# Patient Record
Sex: Male | Born: 1960 | Race: White | Hispanic: No | Marital: Married | Smoking: Former smoker
Health system: Southern US, Community
[De-identification: ages and names within clinical notes are randomized; demographics above are authoritative.]

## PROBLEM LIST (undated history)

## (undated) DIAGNOSIS — F419 Anxiety disorder, unspecified: Secondary | ICD-10-CM

## (undated) DIAGNOSIS — I10 Essential (primary) hypertension: Secondary | ICD-10-CM

## (undated) DIAGNOSIS — I499 Cardiac arrhythmia, unspecified: Secondary | ICD-10-CM

## (undated) DIAGNOSIS — I493 Ventricular premature depolarization: Secondary | ICD-10-CM

## (undated) DIAGNOSIS — M1990 Unspecified osteoarthritis, unspecified site: Secondary | ICD-10-CM

## (undated) DIAGNOSIS — R011 Cardiac murmur, unspecified: Secondary | ICD-10-CM

## (undated) HISTORY — DX: Cardiac murmur, unspecified: R01.1

## (undated) HISTORY — PX: HERNIA REPAIR: SHX51

## (undated) HISTORY — DX: Essential (primary) hypertension: I10

## (undated) HISTORY — DX: Anxiety disorder, unspecified: F41.9

---

## 2008-07-26 ENCOUNTER — Ambulatory Visit: Payer: Self-pay | Admitting: Internal Medicine

## 2008-07-26 DIAGNOSIS — K279 Peptic ulcer, site unspecified, unspecified as acute or chronic, without hemorrhage or perforation: Secondary | ICD-10-CM | POA: Insufficient documentation

## 2008-07-26 DIAGNOSIS — K219 Gastro-esophageal reflux disease without esophagitis: Secondary | ICD-10-CM

## 2008-07-26 DIAGNOSIS — R9431 Abnormal electrocardiogram [ECG] [EKG]: Secondary | ICD-10-CM | POA: Insufficient documentation

## 2008-07-26 DIAGNOSIS — F528 Other sexual dysfunction not due to a substance or known physiological condition: Secondary | ICD-10-CM

## 2008-07-26 DIAGNOSIS — J309 Allergic rhinitis, unspecified: Secondary | ICD-10-CM | POA: Insufficient documentation

## 2008-07-26 DIAGNOSIS — I10 Essential (primary) hypertension: Secondary | ICD-10-CM | POA: Insufficient documentation

## 2008-07-26 LAB — CONVERTED CEMR LAB
AST: 28 units/L (ref 0–37)
Alkaline Phosphatase: 62 units/L (ref 39–117)
BUN: 10 mg/dL (ref 6–23)
Basophils Relative: 0.9 % (ref 0.0–3.0)
Bilirubin Urine: NEGATIVE
Bilirubin, Direct: 0.1 mg/dL (ref 0.0–0.3)
CO2: 29 meq/L (ref 19–32)
Calcium: 9.2 mg/dL (ref 8.4–10.5)
Cholesterol: 216 mg/dL — ABNORMAL HIGH (ref 0–200)
Creatinine, Ser: 0.8 mg/dL (ref 0.4–1.5)
Eosinophils Absolute: 0.2 10*3/uL (ref 0.0–0.7)
HCT: 40 % (ref 39.0–52.0)
HDL: 40.6 mg/dL (ref >39.00)
Hemoglobin: 14 g/dL (ref 13.0–17.0)
Lymphs Abs: 1.5 10*3/uL (ref 0.7–4.0)
MCHC: 35.1 g/dL (ref 30.0–36.0)
Monocytes Relative: 6.4 % (ref 3.0–12.0)
Neutro Abs: 2.3 10*3/uL (ref 1.4–7.7)
Neutrophils Relative %: 54.5 % (ref 43.0–77.0)
RBC: 4.09 M/uL — ABNORMAL LOW (ref 4.22–5.81)
Sodium: 143 meq/L (ref 135–145)
Specific Gravity, Urine: <=1.005 (ref 1.000–1.030)
TSH: 1.71 microintl units/mL (ref 0.35–5.50)
Total Protein: 6.4 g/dL (ref 6.0–8.3)
Urine Glucose: NEGATIVE mg/dL
Urobilinogen, UA: 0.2 (ref 0.0–1.0)
pH: 6 (ref 5.0–8.0)

## 2008-07-27 ENCOUNTER — Encounter: Payer: Self-pay | Admitting: Internal Medicine

## 2008-07-27 ENCOUNTER — Encounter (INDEPENDENT_AMBULATORY_CARE_PROVIDER_SITE_OTHER): Payer: Self-pay | Admitting: *Deleted

## 2008-08-08 ENCOUNTER — Telehealth: Payer: Self-pay | Admitting: Internal Medicine

## 2008-08-23 ENCOUNTER — Ambulatory Visit: Payer: Self-pay | Admitting: Internal Medicine

## 2008-08-23 DIAGNOSIS — E785 Hyperlipidemia, unspecified: Secondary | ICD-10-CM | POA: Insufficient documentation

## 2008-08-23 LAB — CONVERTED CEMR LAB
Cholesterol: 200 mg/dL (ref 0–200)
Direct LDL: 101 mg/dL
HDL: 35.2 mg/dL — ABNORMAL LOW (ref >39.00)

## 2008-09-28 ENCOUNTER — Ambulatory Visit: Payer: Self-pay | Admitting: Internal Medicine

## 2008-09-28 DIAGNOSIS — K649 Unspecified hemorrhoids: Secondary | ICD-10-CM

## 2008-11-09 ENCOUNTER — Ambulatory Visit: Payer: Self-pay | Admitting: Internal Medicine

## 2009-03-26 ENCOUNTER — Ambulatory Visit: Payer: Self-pay | Admitting: Internal Medicine

## 2009-09-26 ENCOUNTER — Ambulatory Visit: Payer: Self-pay | Admitting: Internal Medicine

## 2009-10-12 ENCOUNTER — Ambulatory Visit: Payer: Self-pay | Admitting: Internal Medicine

## 2009-10-12 DIAGNOSIS — K4090 Unilateral inguinal hernia, without obstruction or gangrene, not specified as recurrent: Secondary | ICD-10-CM | POA: Insufficient documentation

## 2009-10-16 ENCOUNTER — Emergency Department (HOSPITAL_COMMUNITY): Admission: EM | Admit: 2009-10-16 | Discharge: 2009-10-17 | Payer: Self-pay | Admitting: Emergency Medicine

## 2009-10-17 ENCOUNTER — Telehealth: Payer: Self-pay | Admitting: Internal Medicine

## 2009-10-23 ENCOUNTER — Encounter: Payer: Self-pay | Admitting: Internal Medicine

## 2009-11-27 ENCOUNTER — Encounter: Payer: Self-pay | Admitting: Internal Medicine

## 2010-01-08 ENCOUNTER — Ambulatory Visit: Payer: Self-pay | Admitting: Internal Medicine

## 2010-04-23 ENCOUNTER — Encounter: Payer: Self-pay | Admitting: Internal Medicine

## 2010-04-23 ENCOUNTER — Ambulatory Visit: Payer: Self-pay | Admitting: Internal Medicine

## 2010-04-23 LAB — CONVERTED CEMR LAB
Basophils Relative: 0.6 % (ref 0.0–3.0)
Bilirubin Urine: NEGATIVE
Blood, UA: NEGATIVE
Calcium: 8.9 mg/dL (ref 8.4–10.5)
Cholesterol: 181 mg/dL (ref 0–200)
Direct LDL: 98.6 mg/dL
Eosinophils Absolute: 0.2 10*3/uL (ref 0.0–0.7)
Eosinophils Relative: 2.8 % (ref 0.0–5.0)
Glucose, Bld: 87 mg/dL (ref 70–99)
HCT: 40.2 % (ref 39.0–52.0)
Hemoglobin: 14.1 g/dL (ref 13.0–17.0)
Ketones, ur: NEGATIVE mg/dL
Lymphocytes Relative: 23.5 % (ref 12.0–46.0)
Lymphs Abs: 1.3 10*3/uL (ref 0.7–4.0)
MCHC: 35.2 g/dL (ref 30.0–36.0)
Monocytes Relative: 7.5 % (ref 3.0–12.0)
PSA: 0.16 ng/mL (ref 0.10–4.00)
RBC: 4.08 M/uL — ABNORMAL LOW (ref 4.22–5.81)
RDW: 13.9 % (ref 11.5–14.6)
Specific Gravity, Urine: >=1.03 (ref 1.000–1.030)
Total Protein: 5.8 g/dL — ABNORMAL LOW (ref 6.0–8.3)
Urobilinogen, UA: 0.2 (ref 0.0–1.0)
VLDL: 51.2 mg/dL — ABNORMAL HIGH (ref 0.0–40.0)
WBC: 5.6 10*3/uL (ref 4.5–10.5)

## 2010-04-26 ENCOUNTER — Telehealth: Payer: Self-pay | Admitting: Internal Medicine

## 2010-05-28 NOTE — Assessment & Plan Note (Signed)
Summary: ?hernia,bulge about "privates' per pt/#/cd   Vital Signs:  Patient profile:   50 year old male Height:      71 inches Weight:      193 pounds BMI:     27.02 O2 Sat:      98 % on Room air Temp:     98.2 degrees F oral Pulse rate:   81 / minute Pulse rhythm:   regular Resp:     16 per minute BP sitting:   126 / 80  (left arm) Cuff size:   large  Vitals Entered By: Rock Nephew CMA (October 12, 2009 3:24 PM)  O2 Flow:  Room air  Primary Care Provider:  Sanda Linger,  MD   History of Present Illness: He reports a 2 day hx. of a bulge in his left groin after lifting a Kayak.  Preventive Screening-Counseling & Management  Alcohol-Tobacco     Alcohol drinks/day: 1     Alcohol type: spirits     >5/day in last 3 mos: no     Alcohol Counseling: not indicated; use of alcohol is not excessive or problematic     Feels need to cut down: no     Feels annoyed by complaints: no     Feels guilty re: drinking: no     Needs 'eye opener' in am: no     Smoking Status: quit     Smoking Cessation Counseling: no     Smoke Cessation Stage: quit     Year Quit: 1998  Hep-HIV-STD-Contraception     Hepatitis Risk: no risk noted     HIV Risk: no risk noted     STD Risk: no risk noted  Medications Prior to Update: 1)  Azor 5-20 Mg Tabs (Amlodipine-Olmesartan) .... Once Daily 2)  Allegra 180 Mg Tabs (Fexofenadine Hcl) .... As Needed 3)  Multivitamins   Tabs (Multiple Vitamin) .... One Tablet By Mouth Once Daily 4)  Garlic 300 Mg Tabs (Garlic) .... Two Tablets By Mouth Once Daily 5)  Fish Oil 1000 Mg Caps (Omega-3 Fatty Acids) .... 3 Tablets By Mouth Once Daily 6)  Aspirin 81 Mg  Tabs (Aspirin) .... Two Tablets By Mouth Once Daily 7)  Benadryl 25 Mg Caps (Diphenhydramine Hcl) .... One Tablet At Bedtime 8)  Flaxseed Oil  Oil (Flaxseed (Linseed)) .... Two Tablet By Mouth Once Daily At Bedtime  Current Medications (verified): 1)  Azor 5-20 Mg Tabs (Amlodipine-Olmesartan) .... Once  Daily 2)  Allegra 180 Mg Tabs (Fexofenadine Hcl) .... As Needed 3)  Multivitamins   Tabs (Multiple Vitamin) .... One Tablet By Mouth Once Daily 4)  Garlic 300 Mg Tabs (Garlic) .... Two Tablets By Mouth Once Daily 5)  Fish Oil 1000 Mg Caps (Omega-3 Fatty Acids) .... 3 Tablets By Mouth Once Daily 6)  Aspirin 81 Mg  Tabs (Aspirin) .... Two Tablets By Mouth Once Daily 7)  Benadryl 25 Mg Caps (Diphenhydramine Hcl) .... One Tablet At Bedtime 8)  Flaxseed Oil  Oil (Flaxseed (Linseed)) .... Two Tablet By Mouth Once Daily At Bedtime  Allergies (verified): No Known Drug Allergies  Past History:  Past Medical History: Last updated: 07/26/2008 Hypertension Peptic ulcer disease Allergic rhinitis PVC's ED Undescended testes causing infertility  Past Surgical History: Last updated: 07/26/2008 Denies surgical history  Family History: Last updated: 09/28/2008 Family History of Arthritis Family History of CAD Male 1st degree relative <50 Family History of Colon CA 1st degree relative father 60's Family History Hypertension Family History of Prostate  CA 1st degree relative 70's Family History of Stroke F 1st degree relative <60 Family History of Heart Disease: Father  Social History: Last updated: 09/28/2008 Occupation: Investment banker, corporate Married Alcohol use-yes Drug use-no Regular exercise-yes Daily Caffeine Use: 1-5 cups of coffee daily  Risk Factors: Alcohol Use: 1 (10/12/2009) >5 drinks/d w/in last 3 months: no (10/12/2009) Exercise: yes (07/26/2008)  Risk Factors: Smoking Status: quit (10/12/2009)  Family History: Reviewed history from 09/28/2008 and no changes required. Family History of Arthritis Family History of CAD Male 1st degree relative <50 Family History of Colon CA 1st degree relative father 80's Family History Hypertension Family History of Prostate CA 1st degree relative 56's Family History of Stroke F 1st degree relative <60 Family History of Heart Disease:  Father  Social History: Reviewed history from 09/28/2008 and no changes required. Occupation: Investment banker, corporate Married Alcohol use-yes Drug use-no Regular exercise-yes Daily Caffeine Use: 1-5 cups of coffee daily  Review of Systems  The patient denies anorexia, fever, chest pain, abdominal pain, hematuria, incontinence, and enlarged lymph nodes.   GU:  Denies decreased libido, discharge, dysuria, erectile dysfunction, hematuria, nocturia, urinary frequency, and urinary hesitancy.  Physical Exam  General:  Well developed, well nourished, no acute distress. Mouth:  No deformity or lesions, dentition normal. Neck:  supple, full ROM, no masses, and no thyromegaly.   Lungs:  Clear throughout to auscultation. Heart:  Regular rate and rhythm; no murmurs, rubs,  or bruits. Abdomen:  soft, non-tender, normal bowel sounds, no distention, no masses, no guarding, no rigidity, and no rebound tenderness.  he has bilateral hernia scars and direct inguinal hernias, L>R. they are easily reduced. Genitalia:  uncircumcised, no scrotal masses, no testicular masses or atrophy, no cutaneous lesions, no urethral discharge, R hydrocele, and L hydrocele.   Msk:  normal ROM, no joint tenderness, and no joint swelling.   Extremities:  no edema Skin:  turgor normal, color normal, no rashes, no suspicious lesions, no ecchymoses, no petechiae, no purpura, and no edema.   Axillary Nodes:  no R axillary adenopathy and no L axillary adenopathy.   Psych:  Cognition and judgment appear intact. Alert and cooperative with normal attention span and concentration. No apparent delusions, illusions, hallucinations   Impression & Recommendations:  Problem # 1:  INGUINAL HERNIA (ICD-550.90) Assessment New  Orders: Surgical Referral (Surgery)  Complete Medication List: 1)  Azor 5-20 Mg Tabs (Amlodipine-olmesartan) .... Once daily 2)  Allegra 180 Mg Tabs (Fexofenadine hcl) .... As needed 3)  Multivitamins Tabs  (Multiple vitamin) .... One tablet by mouth once daily 4)  Garlic 300 Mg Tabs (Garlic) .... Two tablets by mouth once daily 5)  Fish Oil 1000 Mg Caps (Omega-3 fatty acids) .... 3 tablets by mouth once daily 6)  Aspirin 81 Mg Tabs (Aspirin) .... Two tablets by mouth once daily 7)  Benadryl 25 Mg Caps (Diphenhydramine hcl) .... One tablet at bedtime 8)  Flaxseed Oil Oil (Flaxseed (linseed)) .... Two tablet by mouth once daily at bedtime  Patient Instructions: 1)  Please schedule a follow-up appointment in 2 weeks.

## 2010-05-28 NOTE — Assessment & Plan Note (Signed)
Summary: FU ON BP/NWS   #   Vital Signs:  Patient profile:   50 year old male Height:      71 inches Weight:      194 pounds O2 Sat:      98 % on Room air Temp:     99.0 degrees F oral Pulse rate:   75 / minute Pulse rhythm:   regular Resp:     16 per minute BP sitting:   118 / 82  (left arm) Cuff size:   large  Vitals Entered By: Rock Nephew CMA (September 26, 2009 11:01 AM)  O2 Flow:  Room air  Primary Care Provider:  Sanda Linger,  MD   History of Present Illness:  Hypertension Follow-Up      This is a 50 year old man who presents for Hypertension follow-up.  The patient denies lightheadedness, urinary frequency, headaches, edema, impotence, rash, and fatigue.  The patient denies the following associated symptoms: chest pain, chest pressure, exercise intolerance, dyspnea, palpitations, syncope, leg edema, and pedal edema.  Compliance with medications (by patient report) has been near 100%.  The patient reports that dietary compliance has been excellent.  The patient reports exercising 3-4X per week.  Adjunctive measures currently used by the patient include salt restriction and relaxation.    Preventive Screening-Counseling & Management  Alcohol-Tobacco     Alcohol drinks/day: 1     Alcohol type: spirits     >5/day in last 3 mos: no     Alcohol Counseling: not indicated; use of alcohol is not excessive or problematic     Feels need to cut down: no     Feels annoyed by complaints: no     Feels guilty re: drinking: no     Needs 'eye opener' in am: no     Smoking Status: quit     Smoking Cessation Counseling: no     Smoke Cessation Stage: quit     Year Quit: 1998  Hep-HIV-STD-Contraception     Hepatitis Risk: no risk noted     HIV Risk: no risk noted     STD Risk: no risk noted      Sexual History:  currently monogamous.        Drug Use:  never and no.        Blood Transfusions:  no.    Medications Prior to Update: 1)  Azor 5-20 Mg Tabs (Amlodipine-Olmesartan)  .... Once Daily 2)  Allegra 180 Mg Tabs (Fexofenadine Hcl) .... As Needed 3)  Multivitamins   Tabs (Multiple Vitamin) .... One Tablet By Mouth Once Daily 4)  Garlic 300 Mg Tabs (Garlic) .... Two Tablets By Mouth Once Daily 5)  Fish Oil 1000 Mg Caps (Omega-3 Fatty Acids) .... 3 Tablets By Mouth Once Daily 6)  Aspirin 81 Mg  Tabs (Aspirin) .... Two Tablets By Mouth Once Daily 7)  Benadryl 25 Mg Caps (Diphenhydramine Hcl) .... One Tablet At Bedtime 8)  Flaxseed Oil  Oil (Flaxseed (Linseed)) .... Two Tablet By Mouth Once Daily At Bedtime  Current Medications (verified): 1)  Azor 5-20 Mg Tabs (Amlodipine-Olmesartan) .... Once Daily 2)  Allegra 180 Mg Tabs (Fexofenadine Hcl) .... As Needed 3)  Multivitamins   Tabs (Multiple Vitamin) .... One Tablet By Mouth Once Daily 4)  Garlic 300 Mg Tabs (Garlic) .... Two Tablets By Mouth Once Daily 5)  Fish Oil 1000 Mg Caps (Omega-3 Fatty Acids) .... 3 Tablets By Mouth Once Daily 6)  Aspirin 81 Mg  Tabs (Aspirin) .... Two Tablets By Mouth Once Daily 7)  Benadryl 25 Mg Caps (Diphenhydramine Hcl) .... One Tablet At Bedtime 8)  Flaxseed Oil  Oil (Flaxseed (Linseed)) .... Two Tablet By Mouth Once Daily At Bedtime  Allergies (verified): No Known Drug Allergies  Past History:  Past Medical History: Last updated: 07/26/2008 Hypertension Peptic ulcer disease Allergic rhinitis PVC's ED Undescended testes causing infertility  Past Surgical History: Last updated: 07/26/2008 Denies surgical history  Family History: Last updated: 09/28/2008 Family History of Arthritis Family History of CAD Male 1st degree relative <50 Family History of Colon CA 1st degree relative father 14's Family History Hypertension Family History of Prostate CA 1st degree relative 55's Family History of Stroke F 1st degree relative <60 Family History of Heart Disease: Father  Social History: Last updated: 09/28/2008 Occupation: Investment banker, corporate Married Alcohol use-yes Drug  use-no Regular exercise-yes Daily Caffeine Use: 1-5 cups of coffee daily  Risk Factors: Alcohol Use: 1 (09/26/2009) >5 drinks/d w/in last 3 months: no (09/26/2009) Exercise: yes (07/26/2008)  Risk Factors: Smoking Status: quit (09/26/2009)  Family History: Reviewed history from 09/28/2008 and no changes required. Family History of Arthritis Family History of CAD Male 1st degree relative <50 Family History of Colon CA 1st degree relative father 62's Family History Hypertension Family History of Prostate CA 1st degree relative 27's Family History of Stroke F 1st degree relative <60 Family History of Heart Disease: Father  Social History: Reviewed history from 09/28/2008 and no changes required. Occupation: Investment banker, corporate Married Alcohol use-yes Drug use-no Regular exercise-yes Daily Caffeine Use: 1-5 cups of coffee daily  Review of Systems  The patient denies anorexia, fever, chest pain, syncope, peripheral edema, prolonged cough, abdominal pain, suspicious skin lesions, and enlarged lymph nodes.    Physical Exam  General:  Well developed, well nourished, no acute distress. Mouth:  No deformity or lesions, dentition normal. Neck:  supple, full ROM, no masses, and no thyromegaly.   Lungs:  Clear throughout to auscultation. Heart:  Regular rate and rhythm; no murmurs, rubs,  or bruits. Abdomen:  Soft, nontender and nondistended. No masses, hepatosplenomegaly or hernias noted.  Msk:  normal ROM, no joint tenderness, and no joint swelling.   Pulses:  R and L carotid,radial,femoral,dorsalis pedis and posterior tibial pulses are full and equal bilaterally Extremities:  no edema Neurologic:  Alert and  oriented x4;   Skin:  turgor normal, color normal, no rashes, no suspicious lesions, no ecchymoses, no petechiae, no purpura, and no edema.   Cervical Nodes:  No lymphadenopathy noted Axillary Nodes:  No palpable lymphadenopathy Psych:  Cognition and judgment appear intact.  Alert and cooperative with normal attention span and concentration. No apparent delusions, illusions, hallucinations   Impression & Recommendations:  Problem # 1:  HYPERTENSION (ICD-401.9) Assessment Improved  His updated medication list for this problem includes:    Azor 5-20 Mg Tabs (Amlodipine-olmesartan) ..... Once daily  BP today: 118/82 Prior BP: 120/84 (03/26/2009)  Prior 10 Yr Risk Heart Disease: Not enough information (07/26/2008)  Labs Reviewed: K+: 3.9 (07/26/2008) Creat: : 0.8 (07/26/2008)   Chol: 200 (08/23/2008)   HDL: 35.20 (08/23/2008)   TG: 295.0 (08/23/2008)  Complete Medication List: 1)  Azor 5-20 Mg Tabs (Amlodipine-olmesartan) .... Once daily 2)  Allegra 180 Mg Tabs (Fexofenadine hcl) .... As needed 3)  Multivitamins Tabs (Multiple vitamin) .... One tablet by mouth once daily 4)  Garlic 300 Mg Tabs (Garlic) .... Two tablets by mouth once daily 5)  Fish Oil 1000 Mg  Caps (Omega-3 fatty acids) .... 3 tablets by mouth once daily 6)  Aspirin 81 Mg Tabs (Aspirin) .... Two tablets by mouth once daily 7)  Benadryl 25 Mg Caps (Diphenhydramine hcl) .... One tablet at bedtime 8)  Flaxseed Oil Oil (Flaxseed (linseed)) .... Two tablet by mouth once daily at bedtime  Patient Instructions: 1)  Please schedule a follow-up appointment in 4 months. 2)  It is important that you exercise regularly at least 20 minutes 5 times a week. If you develop chest pain, have severe difficulty breathing, or feel very tired , stop exercising immediately and seek medical attention. 3)  You need to lose weight. Consider a lower calorie diet and regular exercise.  4)  Check your Blood Pressure regularly. If it is above 140/90: you should make an appointment. Prescriptions: AZOR 5-20 MG TABS (AMLODIPINE-OLMESARTAN) once daily  #140 x 0   Entered and Authorized by:   Etta Grandchild MD   Signed by:   Etta Grandchild MD on 09/26/2009   Method used:   Samples Given   RxID:   [id number]

## 2010-05-28 NOTE — Assessment & Plan Note (Signed)
Summary: FU---W/BP CHECK---STC   Vital Signs:  Patient profile:   50 year old male Height:      71 inches Weight:      196 pounds O2 Sat:      98 % on Room air Temp:     98.3 degrees F oral Pulse rate:   70 / minute Pulse rhythm:   regular Resp:     16 per minute BP sitting:   132 / 76  O2 Flow:  Room air  Primary Care Provider:  Sanda Linger,  MD   History of Present Illness:  Hypertension Follow-Up      This is a 50 year old man who presents for Hypertension follow-up.  The patient reports impotence, but denies lightheadedness, urinary frequency, headaches, edema, rash, and fatigue.  The patient denies the following associated symptoms: chest pain, chest pressure, exercise intolerance, dyspnea, palpitations, syncope, leg edema, and pedal edema.  Compliance with medications (by patient report) has been near 100%.  The patient reports that dietary compliance has been fair.  The patient reports exercising occasionally.  Adjunctive measures currently used by the patient include salt restriction and relaxation.    Preventive Screening-Counseling & Management  Alcohol-Tobacco     Alcohol drinks/day: 1     Alcohol type: spirits     >5/day in last 3 mos: no     Alcohol Counseling: not indicated; use of alcohol is not excessive or problematic     Feels need to cut down: no     Feels annoyed by complaints: no     Feels guilty re: drinking: no     Needs 'eye opener' in am: no     Smoking Status: quit     Smoking Cessation Counseling: no     Smoke Cessation Stage: quit     Year Quit: 1998     Tobacco Counseling: to remain off tobacco products  Hep-HIV-STD-Contraception     Hepatitis Risk: no risk noted     HIV Risk: no risk noted     STD Risk: no risk noted      Sexual History:  currently monogamous.        Drug Use:  never and no.        Blood Transfusions:  no.    Medications Prior to Update: 1)  Azor 5-20 Mg Tabs (Amlodipine-Olmesartan) .... Once Daily 2)  Allegra 180  Mg Tabs (Fexofenadine Hcl) .... As Needed 3)  Multivitamins   Tabs (Multiple Vitamin) .... One Tablet By Mouth Once Daily 4)  Garlic 300 Mg Tabs (Garlic) .... Two Tablets By Mouth Once Daily 5)  Fish Oil 1000 Mg Caps (Omega-3 Fatty Acids) .... 3 Tablets By Mouth Once Daily 6)  Aspirin 81 Mg  Tabs (Aspirin) .... Two Tablets By Mouth Once Daily 7)  Benadryl 25 Mg Caps (Diphenhydramine Hcl) .... One Tablet At Bedtime 8)  Flaxseed Oil  Oil (Flaxseed (Linseed)) .... Two Tablet By Mouth Once Daily At Bedtime  Current Medications (verified): 1)  Azor 5-20 Mg Tabs (Amlodipine-Olmesartan) .... Once Daily 2)  Allegra 180 Mg Tabs (Fexofenadine Hcl) .... As Needed 3)  Multivitamins   Tabs (Multiple Vitamin) .... One Tablet By Mouth Once Daily 4)  Garlic 300 Mg Tabs (Garlic) .... Two Tablets By Mouth Once Daily 5)  Fish Oil 1000 Mg Caps (Omega-3 Fatty Acids) .... 3 Tablets By Mouth Once Daily 6)  Aspirin 81 Mg  Tabs (Aspirin) .... Two Tablets By Mouth Once Daily 7)  Benadryl 25 Mg  Caps (Diphenhydramine Hcl) .... One Tablet At Bedtime 8)  Flaxseed Oil  Oil (Flaxseed (Linseed)) .... Two Tablet By Mouth Once Daily At Bedtime 9)  Viagra 100 Mg Tabs (Sildenafil Citrate) .... Take As Directed  Allergies (verified): No Known Drug Allergies  Past History:  Past Medical History: Last updated: 07/26/2008 Hypertension Peptic ulcer disease Allergic rhinitis PVC's ED Undescended testes causing infertility  Past Surgical History: Last updated: 07/26/2008 Denies surgical history  Family History: Last updated: 09/28/2008 Family History of Arthritis Family History of CAD Male 1st degree relative <50 Family History of Colon CA 1st degree relative father 68's Family History Hypertension Family History of Prostate CA 1st degree relative 59's Family History of Stroke F 1st degree relative <60 Family History of Heart Disease: Father  Social History: Last updated: 09/28/2008 Occupation: Occupational hygienist Married Alcohol use-yes Drug use-no Regular exercise-yes Daily Caffeine Use: 1-5 cups of coffee daily  Risk Factors: Alcohol Use: 1 (01/08/2010) >5 drinks/d w/in last 3 months: no (01/08/2010) Exercise: yes (07/26/2008)  Risk Factors: Smoking Status: quit (01/08/2010)  Family History: Reviewed history from 09/28/2008 and no changes required. Family History of Arthritis Family History of CAD Male 1st degree relative <50 Family History of Colon CA 1st degree relative father 37's Family History Hypertension Family History of Prostate CA 1st degree relative 72's Family History of Stroke F 1st degree relative <60 Family History of Heart Disease: Father  Social History: Reviewed history from 09/28/2008 and no changes required. Occupation: Investment banker, corporate Married Alcohol use-yes Drug use-no Regular exercise-yes Daily Caffeine Use: 1-5 cups of coffee daily  Review of Systems  The patient denies anorexia, fever, weight loss, weight gain, chest pain, syncope, dyspnea on exertion, peripheral edema, prolonged cough, headaches, hemoptysis, abdominal pain, hematuria, enlarged lymph nodes, and testicular masses.   GU:  Complains of erectile dysfunction; denies decreased libido, discharge, dysuria, hematuria, incontinence, nocturia, urinary frequency, and urinary hesitancy.  Physical Exam  General:  Well developed, well nourished, no acute distress. Head:  Normocephalic and atraumatic without obvious abnormalities. No apparent alopecia or balding. Mouth:  No deformity or lesions, dentition normal. Neck:  supple, full ROM, no masses, and no thyromegaly.   Lungs:  Clear throughout to auscultation. Heart:  Regular rate and rhythm; no murmurs, rubs,  or bruits. Abdomen:  soft, non-tender, normal bowel sounds, no distention, no masses, no guarding, no rigidity, and no rebound tenderness.  he has bilateral hernia scars and direct inguinal hernias, L>R. they are easily reduced. Msk:   normal ROM, no joint tenderness, and no joint swelling.   Pulses:  R and L carotid,radial,femoral,dorsalis pedis and posterior tibial pulses are full and equal bilaterally Extremities:  no edema Neurologic:  Alert and  oriented x4;   Skin:  turgor normal, color normal, no rashes, no suspicious lesions, no ecchymoses, no petechiae, no purpura, and no edema.   Cervical Nodes:  no anterior cervical adenopathy and no posterior cervical adenopathy.   Psych:  Cognition and judgment appear intact. Alert and cooperative with normal attention span and concentration. No apparent delusions, illusions, hallucinations   Impression & Recommendations:  Problem # 1:  HYPERTENSION (ICD-401.9) Assessment Unchanged  His updated medication list for this problem includes:    Azor 5-20 Mg Tabs (Amlodipine-olmesartan) ..... Once daily  BP today: 132/76 Prior BP: 126/80 (10/12/2009)  Prior 10 Yr Risk Heart Disease: Not enough information (07/26/2008)  Labs Reviewed: K+: 3.9 (07/26/2008) Creat: : 0.8 (07/26/2008)   Chol: 200 (08/23/2008)   HDL: 35.20 (08/23/2008)  TG: 295.0 (08/23/2008)  Problem # 2:  ERECTILE DYSFUNCTION (ICD-302.72) Assessment: Unchanged  His updated medication list for this problem includes:    Viagra 100 Mg Tabs (Sildenafil citrate) .Marland Kitchen... Take as directed  Discussed proper use of medications, as well as side effects.   Complete Medication List: 1)  Azor 5-20 Mg Tabs (Amlodipine-olmesartan) .... Once daily 2)  Allegra 180 Mg Tabs (Fexofenadine hcl) .... As needed 3)  Multivitamins Tabs (Multiple vitamin) .... One tablet by mouth once daily 4)  Garlic 300 Mg Tabs (Garlic) .... Two tablets by mouth once daily 5)  Fish Oil 1000 Mg Caps (Omega-3 fatty acids) .... 3 tablets by mouth once daily 6)  Aspirin 81 Mg Tabs (Aspirin) .... Two tablets by mouth once daily 7)  Benadryl 25 Mg Caps (Diphenhydramine hcl) .... One tablet at bedtime 8)  Flaxseed Oil Oil (Flaxseed (linseed)) ....  Two tablet by mouth once daily at bedtime 9)  Viagra 100 Mg Tabs (Sildenafil citrate) .... Take as directed  Patient Instructions: 1)  Please schedule a follow-up appointment in 4 months. 2)  It is important that you exercise regularly at least 20 minutes 5 times a week. If you develop chest pain, have severe difficulty breathing, or feel very tired , stop exercising immediately and seek medical attention. 3)  You need to lose weight. Consider a lower calorie diet and regular exercise.  4)  Check your Blood Pressure regularly. If it is above 140/90: you should make an appointment. Prescriptions: VIAGRA 100 MG TABS (SILDENAFIL CITRATE) take as directed  #25 x 11   Entered and Authorized by:   Etta Grandchild MD   Signed by:   Etta Grandchild MD on 01/08/2010   Method used:   Print then Give to Patient   RxID:   [id number]

## 2010-05-28 NOTE — Letter (Signed)
Summary: [facility]  [facility]   Imported By: Lennie Odor 10/26/2009 14:25:53  _____________________________________________________________________  External Attachment:    Type:   Image     Comment:   External Document

## 2010-05-28 NOTE — Letter (Signed)
Summary: [facility]  [facility]   Imported By: Lester Belle Chasse 11/30/2009 12:47:26  _____________________________________________________________________  External Attachment:    Type:   Image     Comment:   External Document

## 2010-05-28 NOTE — Progress Notes (Signed)
Summary: Call Report  Phone Note Other Incoming   Caller: [facility] Call Report Summary of Call: [facility] Triage Call Report Triage Record Num: [id number] Operator: Sula Rumple Patient Name: [facility] Call Date & Time: 10/16/2009 11:16:19PM Patient Phone: PCP: [place] [facility] Patient Gender: Male PCP Fax : [phone] Patient DOB: 05/09/1960 Practice Name: [facility] Reason for Call: Pt calling on 10/16/09/states he was dx with a hernia last week/has had previous hernia repair as a child/pt has a lump above the lt testicle/lt testicle has been swollen/pt has a fever of 102/pt's testicle is red/more swollen/pain is "not bad"/advised to go to ER Protocol(s) Used: Scrotum / Testicles Symptoms Recommended Outcome per Protocol: See Provider within 4 hours Reason for Outcome: New signs of local infection Care Advice:  ~ 06/ Initial call taken by: Margaret Pyle, CMA,  October 17, 2009 8:22 AM

## 2010-05-30 NOTE — Letter (Signed)
Summary: Lipid Letter  [name]  [address] [place], [place] [address]   Phone: [phone]  Fax: [phone]    04/23/2010  Jared [name] [address] [place], [place]  [address]  Dear Jared [name]:  We have carefully reviewed your last lipid profile from  and the results are noted below with a summary of recommendations for lipid management.    Cholesterol:       181     Goal: <200   HDL "good" Cholesterol:   57.84     Goal: >40   LDL "bad" Cholesterol:   98     Goal: <130   Triglycerides:       256.0     Goal: <150 this needs some work    OTHER LABS LOOK GREAT!!!!!!!!    TLC Diet (Therapeutic Lifestyle Change): Saturated Fats & Transfatty acids should be kept < 7% of total calories ***Reduce Saturated Fats Polyunstaurated Fat can be up to 10% of total calories Monounsaturated Fat Fat can be up to 20% of total calories Total Fat should be no greater than 25-35% of total calories Carbohydrates should be 50-60% of total calories Protein should be approximately 15% of total calories Fiber should be at least 20-30 grams a day ***Increased fiber may help lower LDL Total Cholesterol should be < 200mg /day Consider adding plant stanol/sterols to diet (example: Benacol spread) ***A higher intake of unsaturated fat may reduce Triglycerides and Increase HDL    Adjunctive Measures (may lower LIPIDS and reduce risk of Heart Attack) include: Aerobic Exercise (20-30 minutes 3-4 times a week) Limit Alcohol Consumption Weight Reduction Aspirin 75-81 mg a day by mouth (if not allergic or contraindicated) Dietary Fiber 20-30 grams a day by mouth     Current Medications: 1)    Azor 5-20 Mg Tabs (Amlodipine-olmesartan) .... Once daily 2)    Allegra 180 Mg Tabs (Fexofenadine hcl) .... As needed 3)    Multivitamins   Tabs (Multiple vitamin) .... One tablet by mouth once daily 4)    Garlic 300 Mg Tabs (Garlic) .... Two tablets by mouth once daily 5)    Fish Oil 1000 Mg  Caps (Omega-3 fatty acids) .... 3 tablets by mouth once daily 6)    Aspirin 81 Mg  Tabs (Aspirin) .... Two tablets by mouth once daily 7)    Benadryl 25 Mg Caps (Diphenhydramine hcl) .... One tablet at bedtime 8)    Flaxseed Oil  Oil (Flaxseed (linseed)) .... Two tablet by mouth once daily at bedtime 9)    Viagra 100 Mg Tabs (Sildenafil citrate) .... Take as directed  If you have any questions, please call. We appreciate being able to work with you.   Sincerely,    [facility] Etta Grandchild MD

## 2010-05-30 NOTE — Assessment & Plan Note (Signed)
Summary: CPX /  TO COME FASTING/ NWS   Vital Signs:  Patient profile:   50 year old male Height:      71 inches Weight:      196 pounds BMI:     27.44 O2 Sat:      96 % on Room air Temp:     98.5 degrees F oral Pulse rate:   88 / minute Pulse rhythm:   regular Resp:     16 per minute BP sitting:   100 / 62  (left arm) Cuff size:   large  Vitals Entered By: Bill Salinas CMA (April 23, 2010 9:04 AM)  Nutrition Counseling: Patient's BMI is greater than 25 and therefore counseled on weight management options.  O2 Flow:  Room air CC: cpx, Preventive Care Is Patient Diabetic? No Pain Assessment Patient in pain? no       Vision Screening:      Vision Comments: eye exam oct 2011 with slight change in prescription   Primary Care Provider:  Sanda Linger,  MD  CC:  cpx and Preventive Care.  History of Present Illness:  Follow-Up Visit      This is a 50 year old man who presents for Follow-up visit.  The patient denies chest pain, palpitations, dizziness, syncope, edema, SOB, DOE, PND, and orthopnea.  Since the last visit the patient notes no new problems or concerns.  The patient reports taking meds as prescribed, monitoring BP, and dietary compliance.  When questioned about possible medication side effects, the patient notes none.    Preventive Screening-Counseling & Management  Alcohol-Tobacco     Alcohol drinks/day: 1     Alcohol type: spirits     >5/day in last 3 mos: no     Alcohol Counseling: not indicated; use of alcohol is not excessive or problematic     Feels need to cut down: no     Feels annoyed by complaints: no     Feels guilty re: drinking: no     Needs 'eye opener' in am: no     Smoking Status: quit     Smoking Cessation Counseling: no     Smoke Cessation Stage: quit     Year Quit: 1998     Tobacco Counseling: to remain off tobacco products  Hep-HIV-STD-Contraception     Hepatitis Risk: no risk noted     HIV Risk: no risk noted     STD Risk: no  risk noted      Sexual History:  currently monogamous.        Drug Use:  never and no.        Blood Transfusions:  no.    Clinical Review Panels:  Prevention   Last Colonoscopy:  Location:  [facility].  (11/09/2008)   Last PSA:  0.15 (07/26/2008)  Lipid Management   Cholesterol:  200 (08/23/2008)   HDL (good cholesterol):  35.20 (08/23/2008)  Diabetes Management   Creatinine:  0.8 (07/26/2008)  CBC   WBC:  4.3 (07/26/2008)   RBC:  4.09 (07/26/2008)   Hgb:  14.0 (07/26/2008)   Hct:  40.0 (07/26/2008)   Platelets:  207.0 (07/26/2008)   MCV  97.7 (07/26/2008)   MCHC  35.1 (07/26/2008)   RDW  12.0 (07/26/2008)   PMN:  54.5 (07/26/2008)   Lymphs:  34.4 (07/26/2008)   Monos:  6.4 (07/26/2008)   Eosinophils:  3.8 (07/26/2008)   Basophil:  0.9 (07/26/2008)  Complete Metabolic Panel   Glucose:  96 (  07/26/2008)   Sodium:  143 (07/26/2008)   Potassium:  3.9 (07/26/2008)   Chloride:  109 (07/26/2008)   CO2:  29 (07/26/2008)   BUN:  10 (07/26/2008)   Creatinine:  0.8 (07/26/2008)   Albumin:  3.9 (07/26/2008)   Total Protein:  6.4 (07/26/2008)   Calcium:  9.2 (07/26/2008)   Total Bili:  0.9 (07/26/2008)   Alk Phos:  62 (07/26/2008)   SGPT (ALT):  39 (07/26/2008)   SGOT (AST):  28 (07/26/2008)   Medications Prior to Update: 1)  Azor 5-20 Mg Tabs (Amlodipine-Olmesartan) .... Once Daily 2)  Allegra 180 Mg Tabs (Fexofenadine Hcl) .... As Needed 3)  Multivitamins   Tabs (Multiple Vitamin) .... One Tablet By Mouth Once Daily 4)  Garlic 300 Mg Tabs (Garlic) .... Two Tablets By Mouth Once Daily 5)  Fish Oil 1000 Mg Caps (Omega-3 Fatty Acids) .... 3 Tablets By Mouth Once Daily 6)  Aspirin 81 Mg  Tabs (Aspirin) .... Two Tablets By Mouth Once Daily 7)  Benadryl 25 Mg Caps (Diphenhydramine Hcl) .... One Tablet At Bedtime 8)  Flaxseed Oil  Oil (Flaxseed (Linseed)) .... Two Tablet By Mouth Once Daily At Bedtime 9)  Viagra 100 Mg Tabs (Sildenafil Citrate) .... Take As  Directed  Current Medications (verified): 1)  Azor 5-20 Mg Tabs (Amlodipine-Olmesartan) .... Once Daily 2)  Allegra 180 Mg Tabs (Fexofenadine Hcl) .... As Needed 3)  Multivitamins   Tabs (Multiple Vitamin) .... One Tablet By Mouth Once Daily 4)  Garlic 300 Mg Tabs (Garlic) .... Two Tablets By Mouth Once Daily 5)  Fish Oil 1000 Mg Caps (Omega-3 Fatty Acids) .... 3 Tablets By Mouth Once Daily 6)  Aspirin 81 Mg  Tabs (Aspirin) .... Two Tablets By Mouth Once Daily 7)  Benadryl 25 Mg Caps (Diphenhydramine Hcl) .... One Tablet At Bedtime 8)  Flaxseed Oil  Oil (Flaxseed (Linseed)) .... Two Tablet By Mouth Once Daily At Bedtime 9)  Viagra 100 Mg Tabs (Sildenafil Citrate) .... Take As Directed  Allergies (verified): No Known Drug Allergies  Past History:  Past Medical History: Last updated: 07/26/2008 Hypertension Peptic ulcer disease Allergic rhinitis PVC's ED Undescended testes causing infertility  Past Surgical History: Last updated: 07/26/2008 Denies surgical history  Family History: Last updated: 09/28/2008 Family History of Arthritis Family History of CAD Male 1st degree relative <50 Family History of Colon CA 1st degree relative father 73's Family History Hypertension Family History of Prostate CA 1st degree relative 85's Family History of Stroke F 1st degree relative <60 Family History of Heart Disease: Father  Social History: Last updated: 09/28/2008 Occupation: Investment banker, corporate Married Alcohol use-yes Drug use-no Regular exercise-yes Daily Caffeine Use: 1-5 cups of coffee daily  Risk Factors: Alcohol Use: 1 (04/23/2010) >5 drinks/d w/in last 3 months: no (04/23/2010) Exercise: yes (07/26/2008)  Risk Factors: Smoking Status: quit (04/23/2010)  Family History: Reviewed history from 09/28/2008 and no changes required. Family History of Arthritis Family History of CAD Male 1st degree relative <50 Family History of Colon CA 1st degree relative father  31's Family History Hypertension Family History of Prostate CA 1st degree relative 27's Family History of Stroke F 1st degree relative <60 Family History of Heart Disease: Father  Social History: Reviewed history from 09/28/2008 and no changes required. Occupation: Investment banker, corporate Married Alcohol use-yes Drug use-no Regular exercise-yes Daily Caffeine Use: 1-5 cups of coffee daily  Review of Systems  The patient denies anorexia, fever, weight loss, weight gain, chest pain, syncope, dyspnea on exertion, peripheral edema,  prolonged cough, headaches, hemoptysis, abdominal pain, melena, hematochezia, severe indigestion/heartburn, hematuria, incontinence, suspicious skin lesions, transient blindness, difficulty walking, depression, enlarged lymph nodes, angioedema, and testicular masses.   GU:  Complains of erectile dysfunction; denies decreased libido, discharge, dysuria, hematuria, incontinence, nocturia, urinary frequency, and urinary hesitancy.  Physical Exam  General:  Well developed, well nourished, no acute distress. Head:  Normocephalic and atraumatic without obvious abnormalities. No apparent alopecia or balding. Eyes:  vision grossly intact, pupils equal, pupils round, pupils reactive to light, pupils react to accomodation, and no injection.   Ears:  R ear normal and L ear normal.   Nose:  External nasal examination shows no deformity or inflammation. Nasal mucosa are pink and moist without lesions or exudates. Mouth:  Oral mucosa and oropharynx without lesions or exudates.  Teeth in good repair. Neck:  supple, full ROM, no masses, no thyromegaly, no thyroid nodules or tenderness, no JVD, normal carotid upstroke, no carotid bruits, no cervical lymphadenopathy, and no neck tenderness.   Breasts:  No masses or gynecomastia noted Lungs:  Normal respiratory effort, chest expands symmetrically. Lungs are clear to auscultation, no crackles or wheezes. Heart:  Normal rate and regular  rhythm. S1 and S2 normal without gallop, murmur, click, rub or other extra sounds. Abdomen:  soft, non-tender, normal bowel sounds, no distention, no masses, no guarding, no rigidity, no rebound tenderness, no abdominal hernia, no inguinal hernia, no hepatomegaly, and no splenomegaly.   Rectal:  no external abnormalities, normal sphincter tone, no masses, no tenderness, no fissures, no fistulae, no perianal rash, and external hemorrhoid(s).   Genitalia:  circumcised, no hydrocele, no varicocele, no scrotal masses, no cutaneous lesions, no urethral discharge, R testes atrophic, and L testes atrophic.   Prostate:  no gland enlargement, no nodules, no asymmetry, and no induration.   Msk:  No deformity or scoliosis noted of thoracic or lumbar spine.   Pulses:  R and L carotid,radial,femoral,dorsalis pedis and posterior tibial pulses are full and equal bilaterally Extremities:  No clubbing, cyanosis, edema, or deformity noted with normal full range of motion of all joints.   Neurologic:  No cranial nerve deficits noted. Station and gait are normal. Plantar reflexes are down-going bilaterally. DTRs are symmetrical throughout. Sensory, motor and coordinative functions appear intact. Skin:  turgor normal, color normal, no rashes, no suspicious lesions, no ecchymoses, no petechiae, no purpura, no ulcerations, and no edema.   Cervical Nodes:  no anterior cervical adenopathy and no posterior cervical adenopathy.   Axillary Nodes:  no R axillary adenopathy and no L axillary adenopathy.   Inguinal Nodes:  no R inguinal adenopathy and no L inguinal adenopathy.   Psych:  Cognition and judgment appear intact. Alert and cooperative with normal attention span and concentration. No apparent delusions, illusions, hallucinations Additional Exam:  EKG is normal    Impression & Recommendations:  Problem # 1:  HYPERLIPIDEMIA (ICD-272.4) Assessment Unchanged  Orders: TLB-Lipid Panel (80061-LIPID) TLB-BMP (Basic  Metabolic Panel-BMET) (80048-METABOL) TLB-CBC Platelet - w/Differential (85025-CBCD) TLB-Hepatic/Liver Function Pnl (80076-HEPATIC) TLB-TSH (Thyroid Stimulating Hormone) (84443-TSH) TLB-PSA (Prostate Specific Antigen) (84153-PSA) TLB-Udip w/ Micro (81001-URINE)  Labs Reviewed: SGOT: 28 (07/26/2008)   SGPT: 39 (07/26/2008)  Prior 10 Yr Risk Heart Disease: Not enough information (07/26/2008)   HDL:35.20 (08/23/2008), 40.60 (07/26/2008)  Chol:200 (08/23/2008), 216 (07/26/2008)  Trig:295.0 (08/23/2008), 363.0 (07/26/2008)  Problem # 2:  HYPERTENSION (ICD-401.9) Assessment: Improved  His updated medication list for this problem includes:    Azor 5-20 Mg Tabs (Amlodipine-olmesartan) ..... Once daily  Orders: TLB-Lipid Panel (80061-LIPID) TLB-BMP (Basic Metabolic Panel-BMET) (80048-METABOL) TLB-CBC Platelet - w/Differential (85025-CBCD) TLB-Hepatic/Liver Function Pnl (80076-HEPATIC) TLB-TSH (Thyroid Stimulating Hormone) (84443-TSH) TLB-PSA (Prostate Specific Antigen) (84153-PSA) TLB-Udip w/ Micro (81001-URINE) EKG w/ Interpretation (93000)  Problem # 3:  NONSPECIFIC ABNORMAL ELECTROCARDIOGRAM (ICD-794.31) Assessment: Improved  Orders: TLB-Lipid Panel (80061-LIPID) TLB-BMP (Basic Metabolic Panel-BMET) (80048-METABOL) TLB-CBC Platelet - w/Differential (85025-CBCD) TLB-Hepatic/Liver Function Pnl (80076-HEPATIC) TLB-TSH (Thyroid Stimulating Hormone) (84443-TSH) TLB-PSA (Prostate Specific Antigen) (84153-PSA) TLB-Udip w/ Micro (81001-URINE) EKG w/ Interpretation (93000)  Problem # 4:  Preventive Health Care (ICD-V70.0) Assessment: New  Colonoscopy: Location:  [facility].   (11/09/2008) Chol: 200 (08/23/2008)   HDL: 35.20 (08/23/2008)   TG: 295.0 (08/23/2008) TSH: 1.71 (07/26/2008)   PSA: 0.15 (07/26/2008) Next Colonoscopy due:: 10/2018 (11/09/2008)  Discussed using sunscreen, use of alcohol, drug use, self testicular exam, routine dental care, routine eye care,  routine physical exam, seat belts, multiple vitamins, and recommendations for immunizations.  Discussed exercise and checking cholesterol.  Discussed gun safety, safe sex, and contraception. Also recommend checking PSA.  Complete Medication List: 1)  Azor 5-20 Mg Tabs (Amlodipine-olmesartan) .... Once daily 2)  Allegra 180 Mg Tabs (Fexofenadine hcl) .... As needed 3)  Multivitamins Tabs (Multiple vitamin) .... One tablet by mouth once daily 4)  Garlic 300 Mg Tabs (Garlic) .... Two tablets by mouth once daily 5)  Fish Oil 1000 Mg Caps (Omega-3 fatty acids) .... 3 tablets by mouth once daily 6)  Aspirin 81 Mg Tabs (Aspirin) .... Two tablets by mouth once daily 7)  Benadryl 25 Mg Caps (Diphenhydramine hcl) .... One tablet at bedtime 8)  Flaxseed Oil Oil (Flaxseed (linseed)) .... Two tablet by mouth once daily at bedtime 9)  Viagra 100 Mg Tabs (Sildenafil citrate) .... Take as directed  Other Orders: Venipuncture (04540)  Colorectal Screening:  Current Recommendations:    Hemoccult: NEG X 1 today  PSA Screening:    PSA: 0.15  (07/26/2008)    Reviewed PSA screening recommendations: PSA ordered  Patient Instructions: 1)  Please schedule a follow-up appointment in 4 months. 2)  It is important that you exercise regularly at least 20 minutes 5 times a week. If you develop chest pain, have severe difficulty breathing, or feel very tired , stop exercising immediately and seek medical attention. 3)  You need to lose weight. Consider a lower calorie diet and regular exercise.  4)  Check your Blood Pressure regularly. If it is above 140/90: you should make an appointment.   Orders Added: 1)  Venipuncture [36415] 2)  TLB-Lipid Panel [80061-LIPID] 3)  TLB-BMP (Basic Metabolic Panel-BMET) [80048-METABOL] 4)  TLB-CBC Platelet - w/Differential [85025-CBCD] 5)  TLB-Hepatic/Liver Function Pnl [80076-HEPATIC] 6)  TLB-TSH (Thyroid Stimulating Hormone) [84443-TSH] 7)  TLB-PSA (Prostate Specific  Antigen) [84153-PSA] 8)  TLB-Udip w/ Micro [81001-URINE] 9)  EKG w/ Interpretation [93000] 10)  Est. Patient 40-64 years [98119]

## 2010-05-30 NOTE — Progress Notes (Signed)
Summary: LIPID ?  Phone Note Call from Patient Call back at [phone] (OK VM ON CELL)   Summary of Call: Patient is requesting to know the percentage of LDL small particles to large particles.  Initial call taken by: Lamar Sprinkles, CMA,  April 26, 2010 12:49 PM  Follow-up for Phone Call        we will have to send a specimen to a specialty lab for that, I will oder and see if lab can send from the previous specimen Follow-up by: Etta Grandchild MD,  April 26, 2010 12:54 PM  Additional Follow-up for Phone Call Additional follow up Details #1::        Pt advised via VM that order placed per MD, to call back to check status of test in 3-4 days Additional Follow-up by: Margaret Pyle, CMA,  May 03, 2010 10:48 AM

## 2010-07-14 LAB — DIFFERENTIAL
Basophils Absolute: 0 10*3/uL (ref 0.0–0.1)
Lymphocytes Relative: 16 % (ref 12–46)
Lymphs Abs: 1.5 10*3/uL (ref 0.7–4.0)
Neutro Abs: 6.8 10*3/uL (ref 1.7–7.7)
Neutrophils Relative %: 75 % (ref 43–77)

## 2010-07-14 LAB — URINE CULTURE

## 2010-07-14 LAB — CBC
HCT: 40.1 % (ref 39.0–52.0)
Hemoglobin: 13.5 g/dL (ref 13.0–17.0)
MCV: 99.2 fL (ref 78.0–100.0)
RBC: 4.04 MIL/uL — ABNORMAL LOW (ref 4.22–5.81)
WBC: 9.1 10*3/uL (ref 4.0–10.5)

## 2010-07-14 LAB — BASIC METABOLIC PANEL
BUN: 11 mg/dL (ref 6–23)
CO2: 28 mEq/L (ref 19–32)
Chloride: 104 mEq/L (ref 96–112)
Creatinine, Ser: 0.93 mg/dL (ref 0.4–1.5)
Glucose, Bld: 115 mg/dL — ABNORMAL HIGH (ref 70–99)
Potassium: 3.9 mEq/L (ref 3.5–5.1)

## 2010-07-14 LAB — URINALYSIS, ROUTINE W REFLEX MICROSCOPIC
Ketones, ur: NEGATIVE mg/dL
Nitrite: NEGATIVE
pH: 6 (ref 5.0–8.0)

## 2010-07-14 LAB — URINE MICROSCOPIC-ADD ON

## 2013-12-19 ENCOUNTER — Ambulatory Visit (INDEPENDENT_AMBULATORY_CARE_PROVIDER_SITE_OTHER): Payer: 59 | Admitting: Internal Medicine

## 2013-12-19 ENCOUNTER — Encounter: Payer: Self-pay | Admitting: Internal Medicine

## 2013-12-19 VITALS — BP 130/92 | HR 97 | Temp 98.6°F | Resp 16

## 2013-12-19 DIAGNOSIS — I776 Arteritis, unspecified: Secondary | ICD-10-CM

## 2013-12-19 LAB — POCT URINALYSIS DIPSTICK
BILIRUBIN UA: NEGATIVE
Glucose, UA: NEGATIVE
KETONES UA: NEGATIVE
LEUKOCYTES UA: NEGATIVE
Nitrite, UA: NEGATIVE
PROTEIN UA: NEGATIVE
RBC UA: NEGATIVE
Spec Grav, UA: <=1.005
UROBILINOGEN UA: 0.2
pH, UA: 6

## 2013-12-19 LAB — POCT CBC
GRANULOCYTE PERCENT: 63.7 % (ref 37–80)
HCT, POC: 42.9 % — AB (ref 43.5–53.7)
Hemoglobin: 14 g/dL — AB (ref 14.1–18.1)
Lymph, poc: 1.3 (ref 0.6–3.4)
MCH: 31.7 pg — AB (ref 27–31.2)
MCHC: 32.8 g/dL (ref 31.8–35.4)
MCV: 96.6 fL (ref 80–97)
MID (CBC): 0.4 (ref 0–0.9)
MPV: 6.4 fL (ref 0–99.8)
PLATELET COUNT, POC: 205 10*3/uL (ref 142–424)
POC Granulocyte: 2.9 (ref 2–6.9)
POC LYMPH %: 28.6 % (ref 10–50)
POC MID %: 7.7 %M (ref 0–12)
RBC: 4.44 M/uL — AB (ref 4.69–6.13)
RDW, POC: 13.7 %
WBC: 4.6 10*3/uL (ref 4.6–10.2)

## 2013-12-19 LAB — POCT UA - MICROSCOPIC ONLY
Bacteria, U Microscopic: NEGATIVE
CRYSTALS, UR, HPF, POC: NEGATIVE
Casts, Ur, LPF, POC: NEGATIVE
Mucus, UA: NEGATIVE
RBC, URINE, MICROSCOPIC: NEGATIVE
Yeast, UA: NEGATIVE

## 2013-12-19 MED ORDER — FLUOCINONIDE 0.05 % EX CREA: 1.0000 "application " | TOPICAL_CREAM | Freq: Two times a day (BID) | CUTANEOUS | Status: DC

## 2013-12-19 MED ORDER — SULFAMETHOXAZOLE-TMP DS 800-160 MG PO TABS: 1.0000 | ORAL_TABLET | Freq: Two times a day (BID) | ORAL | Status: DC

## 2013-12-19 NOTE — Progress Notes (Addendum)
Subjective:    Patient ID: Jared [name], male    DOB: 02/26/1961, 53 y.o.   MRN: [id number]  This chart was scribed for Tami Lin, MD by Erling Conte, Medical Scribe. This patient was seen in Room 6 and the patient's care was started at 8:10 PM.   Chief Complaint  Patient presents with  . Leg Pain  . Leg Swelling    HPI HPI Comments: Jared [name] is a 53 y.o. male with a h/o inguinal hernia, hemorrhoids, hyperlipidemia, erectile dysfunction, HTN, GERD, and peptic ulcers who presents to the [facility] complaining of constant, moderate, gradually worsening leg pain in his right leg that began this morning. He is having associated swelling of the right lower calf, redness to area, and chills tho unsure if febrile. Pt is concerned about a possible DVT. He denies any history of DVT in the past. Pt states he was cutting the grass earlier today when he noticed the pain and redness to his left calf. He admits that the pain is exacerbated by movement and ambulation. He denies any shortness of breath, fever, or chest pain.  Patient Active Problem List   Diagnosis Date Noted  . INGUINAL HERNIA 10/12/2009  . HEMORRHOIDS, WITH BLEEDING 09/28/2008  . HYPERLIPIDEMIA 08/23/2008  . ERECTILE DYSFUNCTION 07/26/2008  . HYPERTENSION 07/26/2008  . ALLERGIC RHINITIS 07/26/2008  . GERD 07/26/2008  . PEPTIC ULCER DISEASE 07/26/2008  . NONSPECIFIC ABNORMAL ELECTROCARDIOGRAM 07/26/2008   No past medical history on file. No past surgical history on file. No Known Allergies Prior to Admission medications   Medication Sig Start Date End Date Taking? Authorizing Provider  aspirin 81 MG tablet Take 81 mg by mouth daily.   Yes Historical Provider, MD  amlod-olmesartin       Review of Systems  Constitutional: Positive for chills and diaphoresis. Negative for fever.  Respiratory: Negative for shortness of breath.   Cardiovascular: Negative for chest pain.    Musculoskeletal: Positive for arthralgias, joint swelling and myalgias.  Skin: Positive for color change (redness to R lower calf).  All other systems reviewed and are negative.      Objective:   Physical Exam  Nursing note and vitals reviewed. Constitutional: He is oriented to person, place, and time. He appears well-developed and well-nourished. No distress.  HENT:  Head: Normocephalic and atraumatic.  Right Ear: External ear normal.  Left Ear: External ear normal.  Eyes: Conjunctivae and EOM are normal. Pupils are equal, round, and reactive to light.  Neck: Normal range of motion. Neck supple.  Cardiovascular: Normal rate, regular rhythm and normal heart sounds.  Exam reveals no gallop and no friction rub.   No murmur heard. Pulmonary/Chest: Effort normal and breath sounds normal. No respiratory distress.  Musculoskeletal: Normal range of motion.  Redness and tenderness in bilateral lower legs. R > than L. Vasculitis present. Full peripheral pulses. No joint involvement.  Neurological: He is alert and oriented to person, place, and time.  Skin: Skin is warm and dry. Rash noted.  Psychiatric: He has a normal mood and affect. His behavior is normal.    Filed Vitals:   12/19/13 2004  BP: 130/92  Pulse: 97  Temp: 98.6 F (37 C)  Resp: 16  SpO2: 99%   Results for orders placed in visit on 12/19/13  COMPREHENSIVE METABOLIC PANEL      Result Value Ref Range   Sodium 138  135 - 145 mEq/L   Potassium 4.4  3.5 - 5.3 mEq/L  Chloride 104  96 - 112 mEq/L   CO2 27  19 - 32 mEq/L   Glucose, Bld 95  70 - 99 mg/dL   BUN 20  6 - 23 mg/dL   Creat 1.32  0.50 - 1.35 mg/dL   Total Bilirubin 0.6  0.2 - 1.2 mg/dL   Alkaline Phosphatase 81  39 - 117 U/L   AST 40 (*) 0 - 37 U/L   ALT 41  0 - 53 U/L   Total Protein 6.8  6.0 - 8.3 g/dL   Albumin 4.4  3.5 - 5.2 g/dL   Calcium 9.2  8.4 - 10.5 mg/dL  SEDIMENTATION RATE      Result Value Ref Range   Sed Rate    0 - 16 mm/hr  POCT CBC       Result Value Ref Range   WBC 4.6  4.6 - 10.2 K/uL   Lymph, poc 1.3  0.6 - 3.4   POC LYMPH PERCENT 28.6  10 - 50 %L   MID (cbc) 0.4  0 - 0.9   POC MID % 7.7  0 - 12 %M   POC Granulocyte 2.9  2 - 6.9   Granulocyte percent 63.7  37 - 80 %G   RBC 4.44 (*) 4.69 - 6.13 M/uL   Hemoglobin 14.0 (*) 14.1 - 18.1 g/dL   HCT, POC 42.9 (*) 43.5 - 53.7 %   MCV 96.6  80 - 97 fL   MCH, POC 31.7 (*) 27 - 31.2 pg   MCHC 32.8  31.8 - 35.4 g/dL   RDW, POC 13.7     Platelet Count, POC 205  142 - 424 K/uL   MPV 6.4  0 - 99.8 fL  POCT URINALYSIS DIPSTICK      Result Value Ref Range   Color, UA yellow     Clarity, UA clear     Glucose, UA neg     Bilirubin, UA neg     Ketones, UA neg     Spec Grav, UA <=1.005     Blood, UA neg     pH, UA 6.0     Protein, UA neg     Urobilinogen, UA 0.2     Nitrite, UA neg     Leukocytes, UA Negative    POCT UA - MICROSCOPIC ONLY      Result Value Ref Range   WBC, Ur, HPF, POC 0-1     RBC, urine, microscopic neg     Bacteria, U Microscopic neg     Mucus, UA neg     Epithelial cells, urine per micros 0-1     Crystals, Ur, HPF, POC neg     Casts, Ur, LPF, POC neg     Yeast, UA neg           Assessment & Plan:   Vasculitis -small vessel-uncomplicated Cover for cellulitis Apply lidex for hypersens  have completed the patient encounter in its entirety as documented by the scribe, with editing by me where necessary. Lynley Killilea P. Laney Pastor, M.D.  F/u 48-72 Esr pending--1 lft up

## 2013-12-20 LAB — COMPREHENSIVE METABOLIC PANEL
ALT: 41 U/L (ref 0–53)
AST: 40 U/L — AB (ref 0–37)
Albumin: 4.4 g/dL (ref 3.5–5.2)
Alkaline Phosphatase: 81 U/L (ref 39–117)
BILIRUBIN TOTAL: 0.6 mg/dL (ref 0.2–1.2)
BUN: 20 mg/dL (ref 6–23)
CHLORIDE: 104 meq/L (ref 96–112)
CO2: 27 meq/L (ref 19–32)
Calcium: 9.2 mg/dL (ref 8.4–10.5)
Creat: 1.32 mg/dL (ref 0.50–1.35)
Glucose, Bld: 95 mg/dL (ref 70–99)
Potassium: 4.4 mEq/L (ref 3.5–5.3)
Sodium: 138 mEq/L (ref 135–145)
TOTAL PROTEIN: 6.8 g/dL (ref 6.0–8.3)

## 2013-12-21 LAB — SEDIMENTATION RATE

## 2014-05-19 ENCOUNTER — Emergency Department (HOSPITAL_COMMUNITY)
Admission: EM | Admit: 2014-05-19 | Discharge: 2014-05-21 | Disposition: A | Discharge status: Home / Self Care | Payer: Federal, State, Local not specified - Other | Attending: Emergency Medicine | Admitting: Emergency Medicine

## 2014-05-19 ENCOUNTER — Encounter (HOSPITAL_COMMUNITY): Payer: Self-pay | Admitting: Oncology

## 2014-05-19 DIAGNOSIS — T1491XA Suicide attempt, initial encounter: Secondary | ICD-10-CM | POA: Diagnosis present

## 2014-05-19 DIAGNOSIS — I10 Essential (primary) hypertension: Secondary | ICD-10-CM | POA: Insufficient documentation

## 2014-05-19 DIAGNOSIS — Z87891 Personal history of nicotine dependence: Secondary | ICD-10-CM | POA: Insufficient documentation

## 2014-05-19 DIAGNOSIS — R011 Cardiac murmur, unspecified: Secondary | ICD-10-CM | POA: Insufficient documentation

## 2014-05-19 DIAGNOSIS — R45851 Suicidal ideations: Secondary | ICD-10-CM | POA: Insufficient documentation

## 2014-05-19 DIAGNOSIS — Z79899 Other long term (current) drug therapy: Secondary | ICD-10-CM | POA: Insufficient documentation

## 2014-05-19 DIAGNOSIS — F329 Major depressive disorder, single episode, unspecified: Secondary | ICD-10-CM | POA: Diagnosis present

## 2014-05-19 DIAGNOSIS — F32A Depression, unspecified: Secondary | ICD-10-CM | POA: Diagnosis present

## 2014-05-19 DIAGNOSIS — F1010 Alcohol abuse, uncomplicated: Secondary | ICD-10-CM | POA: Diagnosis present

## 2014-05-19 DIAGNOSIS — R000 Tachycardia, unspecified: Secondary | ICD-10-CM | POA: Insufficient documentation

## 2014-05-19 DIAGNOSIS — Z792 Long term (current) use of antibiotics: Secondary | ICD-10-CM | POA: Insufficient documentation

## 2014-05-19 NOTE — ED Provider Notes (Signed)
CSN: [id number]     Arrival date & time 05/19/14  2314 History   First MD Initiated Contact with Patient 05/19/14 2321     Chief Complaint  Patient presents with  . Medical Clearance    pt brought in by [employer] d/t argument w/ wife     (Consider location/radiation/quality/duration/timing/severity/associated sxs/prior Treatment) HPI Comments: 54 year old male brought to the ED by the police after stating he was going to shoot himself in his head to his wife this evening. Patient states he and his wife were having an argument over their child's snow pants, he got angry and stated that he was going to shoot himself in the head. He denies any suicidal or homicidal ideations. States he would not act on this. Admits to drinking "enough" today, including wine, alcoholic coolers and Jaeger. States he drinks on a daily basis, "red wine since it will keep me alive for over 100 years". Denies any pain at this time.  The history is provided by the patient and the police.    Past Medical History  Diagnosis Date  . Heart murmur   . Hypertension    History reviewed. No pertinent past surgical history. Family History  Problem Relation Age of Onset  . Heart disease Mother   . Hypertension Mother   . Stroke Mother   . Heart disease Father    History  Substance Use Topics  . Smoking status: Former Games developer  . Smokeless tobacco: Not on file  . Alcohol Use: 7.0 oz/week    14 Not specified per week    Review of Systems  Psychiatric/Behavioral:       + Alcohol use.  All other systems reviewed and are negative.     Allergies  Review of patient's allergies indicates no known allergies.  Home Medications   Prior to Admission medications   Medication Sig Start Date End Date Taking? Authorizing Provider  amLODipine-olmesartan (AZOR) 5-20 MG per tablet Take 1 tablet by mouth daily.   Yes Historical Provider, MD  aspirin 81 MG tablet Take 81 mg by mouth daily.   Yes Historical Provider, MD   fluocinonide cream (LIDEX) 0.05 % Apply 1 application topically 2 (two) times daily. Patient not taking: Reported on 05/19/2014 12/19/13   Tonye Pearson, MD  sulfamethoxazole-trimethoprim (BACTRIM DS) 800-160 MG per tablet Take 1 tablet by mouth 2 (two) times daily. Patient not taking: Reported on 05/19/2014 12/19/13   Tonye Pearson, MD   BP 150/91 mmHg  Pulse 87  Temp(Src) 98.9 F (37.2 C) (Oral)  Resp 18  Ht  (1.778 m)  Wt 200 lb (90.719 kg)  BMI 28.70 kg/m2  SpO2 100% Physical Exam  Constitutional: He is oriented to person, place, and time. He appears well-developed and well-nourished. No distress.  HENT:  Head: Normocephalic and atraumatic.  Eyes: Conjunctivae and EOM are normal.  Neck: Normal range of motion. Neck supple.  Cardiovascular: Regular rhythm and normal heart sounds.  Tachycardia present.   Pulmonary/Chest: Effort normal and breath sounds normal.  Musculoskeletal: Normal range of motion. He exhibits no edema.  Neurological: He is alert and oriented to person, place, and time.  Skin: Skin is warm and dry.  Psychiatric: His behavior is normal. His affect is blunt. He expresses no homicidal and no suicidal ideation.  Nursing note and vitals reviewed.   ED Course  Procedures (including critical care time) Labs Review Labs Reviewed  COMPREHENSIVE METABOLIC PANEL - Abnormal; Notable for the following:    Glucose, Bld  137 (*)    AST 39 (*)    All other components within normal limits  ETHANOL - Abnormal; Notable for the following:    Alcohol, Ethyl (B) 161 (*)    All other components within normal limits  ACETAMINOPHEN LEVEL - Abnormal; Notable for the following:    Acetaminophen (Tylenol), Serum <10.0 (*)    All other components within normal limits  SALICYLATE LEVEL  CBC  URINE RAPID DRUG SCREEN (HOSP PERFORMED)    Imaging Review No results found.   EKG Interpretation None      MDM   Final diagnoses:  Suicidal ideations   Pt with  threats to shoot himself and intoxicated. NAD. Here voluntarily. Etoh 161. AAOx3. Medically cleared. Assessed by Gilberto BetterLaquesta Sims with [facility], inpatient treatment recommended. Awaiting placement.  Kathrynn SpeedRobyn M Braylea Brancato, PA-C 05/20/14 2001  Candyce ChurnJohn David Wofford III, MD 05/22/14 [phone]

## 2014-05-19 NOTE — ED Notes (Signed)
Pt was brought here by [employer] after an argument with his wife.  Pt states he told his wife he was going to shoot himself in the head.  Pt is actively denying SI/HI.

## 2014-05-20 DIAGNOSIS — R45851 Suicidal ideations: Secondary | ICD-10-CM | POA: Insufficient documentation

## 2014-05-20 DIAGNOSIS — T1491XA Suicide attempt, initial encounter: Secondary | ICD-10-CM | POA: Diagnosis present

## 2014-05-20 DIAGNOSIS — F32A Depression, unspecified: Secondary | ICD-10-CM | POA: Diagnosis present

## 2014-05-20 DIAGNOSIS — F1010 Alcohol abuse, uncomplicated: Secondary | ICD-10-CM

## 2014-05-20 DIAGNOSIS — F329 Major depressive disorder, single episode, unspecified: Secondary | ICD-10-CM

## 2014-05-20 DIAGNOSIS — T1491 Suicide attempt: Secondary | ICD-10-CM

## 2014-05-20 LAB — COMPREHENSIVE METABOLIC PANEL
ALT: 47 U/L (ref 0–53)
AST: 39 U/L — AB (ref 0–37)
Albumin: 4.6 g/dL (ref 3.5–5.2)
Alkaline Phosphatase: 82 U/L (ref 39–117)
Anion gap: 9 (ref 5–15)
BUN: 12 mg/dL (ref 6–23)
CO2: 24 mmol/L (ref 19–32)
Calcium: 9 mg/dL (ref 8.4–10.5)
Chloride: 108 mmol/L (ref 96–112)
Creatinine, Ser: 0.91 mg/dL (ref 0.50–1.35)
GFR calc Af Amer: >90 mL/min (ref >90)
Glucose, Bld: 137 mg/dL — ABNORMAL HIGH (ref 70–99)
Potassium: 4.2 mmol/L (ref 3.5–5.1)
SODIUM: 141 mmol/L (ref 135–145)
Total Bilirubin: 0.7 mg/dL (ref 0.3–1.2)
Total Protein: 7.3 g/dL (ref 6.0–8.3)

## 2014-05-20 LAB — CBC
HCT: 42.3 % (ref 39.0–52.0)
HEMOGLOBIN: 15 g/dL (ref 13.0–17.0)
MCH: 33.9 pg (ref 26.0–34.0)
MCHC: 35.5 g/dL (ref 30.0–36.0)
MCV: 95.7 fL (ref 78.0–100.0)
PLATELETS: 245 10*3/uL (ref 150–400)
RBC: 4.42 MIL/uL (ref 4.22–5.81)
RDW: 12.4 % (ref 11.5–15.5)
WBC: 5.2 10*3/uL (ref 4.0–10.5)

## 2014-05-20 LAB — ETHANOL: ALCOHOL ETHYL (B): 161 mg/dL — AB (ref 0–9)

## 2014-05-20 LAB — RAPID URINE DRUG SCREEN, HOSP PERFORMED
Amphetamines: NOT DETECTED
BARBITURATES: NOT DETECTED
BENZODIAZEPINES: NOT DETECTED
Cocaine: NOT DETECTED
OPIATES: NOT DETECTED
TETRAHYDROCANNABINOL: NOT DETECTED

## 2014-05-20 LAB — ACETAMINOPHEN LEVEL

## 2014-05-20 LAB — SALICYLATE LEVEL: Salicylate Lvl: <4 mg/dL (ref 2.8–20.0)

## 2014-05-20 MED ORDER — AMLODIPINE BESYLATE 5 MG PO TABS
5.0000 mg | ORAL_TABLET | Freq: Every day | ORAL | Status: DC
  Administered 2014-05-20 (×3): 5 mg via ORAL
  Filled 2014-05-20 (×5): qty 1

## 2014-05-20 MED ORDER — ONDANSETRON HCL 4 MG PO TABS: 4.0000 mg | ORAL_TABLET | Freq: Three times a day (TID) | ORAL | Status: DC | PRN

## 2014-05-20 MED ORDER — PHENYLEPH-SHARK LIV OIL-MO-PET 0.25-3-14-71.9 % RE OINT
TOPICAL_OINTMENT | Freq: Two times a day (BID) | RECTAL | Status: DC | PRN
  Administered 2014-05-20: 22:00:00 via RECTAL
  Filled 2014-05-20: qty 28.4

## 2014-05-20 MED ORDER — LORAZEPAM 1 MG PO TABS
1.0000 mg | ORAL_TABLET | Freq: Three times a day (TID) | ORAL | Status: DC | PRN
  Administered 2014-05-20 – 2014-05-21 (×3): 1 mg via ORAL
  Filled 2014-05-20 (×3): qty 1

## 2014-05-20 MED ORDER — AMLODIPINE-OLMESARTAN 5-20 MG PO TABS: 1.0000 | ORAL_TABLET | Freq: Every day | ORAL | Status: DC

## 2014-05-20 MED ORDER — NICOTINE 21 MG/24HR TD PT24
21.0000 mg | MEDICATED_PATCH | Freq: Every day | TRANSDERMAL | Status: DC
  Filled 2014-05-20 (×2): qty 1

## 2014-05-20 MED ORDER — IBUPROFEN 200 MG PO TABS: 600.0000 mg | ORAL_TABLET | Freq: Three times a day (TID) | ORAL | Status: DC | PRN

## 2014-05-20 MED ORDER — ALUM & MAG HYDROXIDE-SIMETH 200-200-20 MG/5ML PO SUSP: 30.0000 mL | ORAL | Status: DC | PRN

## 2014-05-20 MED ORDER — IRBESARTAN 150 MG PO TABS
150.0000 mg | ORAL_TABLET | Freq: Every day | ORAL | Status: DC
  Administered 2014-05-20 (×3): 150 mg via ORAL
  Filled 2014-05-20 (×5): qty 1

## 2014-05-20 MED ORDER — ZOLPIDEM TARTRATE 5 MG PO TABS: 5.0000 mg | ORAL_TABLET | Freq: Every evening | ORAL | Status: DC | PRN

## 2014-05-20 NOTE — Progress Notes (Signed)
Pt would like to use cell phone to email his attorney. Explained that pts may not access secured electronics in [facility]. Pt asked if he was being denied his right to contact an attorney. Explained that he could use the [facility] phone. Pt wanted to be the first to see the provider this a.m. He denied SI/HI/AVH and contracts for safety. Will continue to monitor for needs/safety.

## 2014-05-20 NOTE — ED Notes (Addendum)
Pt declined AM heart meds -- said he wanted to take them in the evening since he taken them at about 0100 today.
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
## 2014-05-20 NOTE — ED Notes (Signed)
Pt. To [facility] from ED ambulatory without difficulty, to room 37 . Report from Beazer HomesFrances RN. Pt. Is alert and oriented, warm and dry in no distress. Pt. Denies SI, HI, and AVH. Pt. Calm and cooperative. Pt. Made aware of security cameras and Q15 minute rounds. Pt. Encouraged to let Nursing staff know of any concerns or needs. Ice water given.

## 2014-05-20 NOTE — BH Assessment (Signed)
Assessment completed. Consulted Alberteen SamFran Hobson, NP who recommended inpatient treatment. Robyn Hess, PA-C has been informed of the recommendation.
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
## 2014-05-20 NOTE — BH Assessment (Signed)
Inpt recommended. [facility].   Clista BernhardtNancy Dinia Joynt, [facility] Triage Specialist 05/20/2014 3:27 AM

## 2014-05-20 NOTE — ED Notes (Signed)
Pt. Asked if he could "go outside since I'm here voluntarily". Pt. Upset after he was told that he was now IVC.

## 2014-05-20 NOTE — ED Notes (Signed)
Report received from Karen RN. Pt. Alert and oriented in no distress denies SI, HI, AVH and pain.  Pt. Instructed to come to me with problems or concerns.Will continue to monitor for safety via security cameras and Q 15 minute checks. 
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
## 2014-05-20 NOTE — Progress Notes (Signed)
CSW continued placement efforts:    [facility] was [facility] there for possible consideration on previous shift  F[facility]ed to [facility] reported had not reviewed referrals as of yet but will get to them soon [facility]-Reported had not reviewed referrals as of yet but will call back when reviewed with disposition   [facility] [place]   Please Note:   May inpatient Facilities reported due to transportation barriers if patients could not be safely transported today then do not attempt to make referral today as they will not be reviewed and considered due to weather.    Adelene AmasEdith Teri Legacy, LCSW Disposition Social Worker [phone]

## 2014-05-21 ENCOUNTER — Inpatient Hospital Stay (HOSPITAL_COMMUNITY)
Admission: AD | Admit: 2014-05-21 | Discharge: 2014-05-24 | DRG: 885 | Disposition: A | Discharge status: Home / Self Care | Payer: Federal, State, Local not specified - Other | Attending: Psychiatry | Admitting: Psychiatry

## 2014-05-21 ENCOUNTER — Encounter (HOSPITAL_COMMUNITY): Payer: Self-pay | Admitting: *Deleted

## 2014-05-21 DIAGNOSIS — K219 Gastro-esophageal reflux disease without esophagitis: Secondary | ICD-10-CM | POA: Diagnosis present

## 2014-05-21 DIAGNOSIS — I10 Essential (primary) hypertension: Secondary | ICD-10-CM | POA: Diagnosis present

## 2014-05-21 DIAGNOSIS — Z8249 Family history of ischemic heart disease and other diseases of the circulatory system: Secondary | ICD-10-CM | POA: Diagnosis not present

## 2014-05-21 DIAGNOSIS — Z823 Family history of stroke: Secondary | ICD-10-CM

## 2014-05-21 DIAGNOSIS — Z23 Encounter for immunization: Secondary | ICD-10-CM

## 2014-05-21 DIAGNOSIS — G4700 Insomnia, unspecified: Secondary | ICD-10-CM | POA: Diagnosis present

## 2014-05-21 DIAGNOSIS — F322 Major depressive disorder, single episode, severe without psychotic features: Principal | ICD-10-CM

## 2014-05-21 DIAGNOSIS — Y906 Blood alcohol level of 120-199 mg/100 ml: Secondary | ICD-10-CM | POA: Diagnosis present

## 2014-05-21 DIAGNOSIS — E785 Hyperlipidemia, unspecified: Secondary | ICD-10-CM | POA: Diagnosis present

## 2014-05-21 DIAGNOSIS — F1010 Alcohol abuse, uncomplicated: Secondary | ICD-10-CM | POA: Diagnosis present

## 2014-05-21 DIAGNOSIS — Z87891 Personal history of nicotine dependence: Secondary | ICD-10-CM | POA: Diagnosis not present

## 2014-05-21 DIAGNOSIS — Z7982 Long term (current) use of aspirin: Secondary | ICD-10-CM

## 2014-05-21 DIAGNOSIS — F332 Major depressive disorder, recurrent severe without psychotic features: Secondary | ICD-10-CM

## 2014-05-21 DIAGNOSIS — R45851 Suicidal ideations: Secondary | ICD-10-CM

## 2014-05-21 MED ORDER — NICOTINE 21 MG/24HR TD PT24
21.0000 mg | MEDICATED_PATCH | Freq: Every day | TRANSDERMAL | Status: DC
  Filled 2014-05-21 (×3): qty 1

## 2014-05-21 MED ORDER — AMLODIPINE BESYLATE 5 MG PO TABS
5.0000 mg | ORAL_TABLET | Freq: Every day | ORAL | Status: DC
  Administered 2014-05-21: 5 mg via ORAL
  Filled 2014-05-21 (×4): qty 1

## 2014-05-21 MED ORDER — ALUM & MAG HYDROXIDE-SIMETH 200-200-20 MG/5ML PO SUSP: 30.0000 mL | ORAL | Status: DC | PRN

## 2014-05-21 MED ORDER — ZOLPIDEM TARTRATE 5 MG PO TABS: 5.0000 mg | ORAL_TABLET | Freq: Every evening | ORAL | Status: DC | PRN

## 2014-05-21 MED ORDER — ACETAMINOPHEN 325 MG PO TABS: 650.0000 mg | ORAL_TABLET | Freq: Four times a day (QID) | ORAL | Status: DC | PRN

## 2014-05-21 MED ORDER — LORAZEPAM 1 MG PO TABS
1.0000 mg | ORAL_TABLET | Freq: Three times a day (TID) | ORAL | Status: DC | PRN
  Administered 2014-05-21 – 2014-05-23 (×3): 1 mg via ORAL
  Filled 2014-05-21 (×3): qty 1

## 2014-05-21 MED ORDER — IBUPROFEN 600 MG PO TABS: 600.0000 mg | ORAL_TABLET | Freq: Three times a day (TID) | ORAL | Status: DC | PRN

## 2014-05-21 MED ORDER — IRBESARTAN 150 MG PO TABS
150.0000 mg | ORAL_TABLET | Freq: Every day | ORAL | Status: DC
  Administered 2014-05-21: 150 mg via ORAL
  Filled 2014-05-21: qty 2
  Filled 2014-05-21 (×3): qty 1

## 2014-05-21 MED ORDER — PHENYLEPH-SHARK LIV OIL-MO-PET 0.25-3-14-71.9 % RE OINT: TOPICAL_OINTMENT | Freq: Two times a day (BID) | RECTAL | Status: DC | PRN

## 2014-05-21 MED ORDER — INFLUENZA VAC SPLIT QUAD 0.5 ML IM SUSY
0.5000 mL | PREFILLED_SYRINGE | INTRAMUSCULAR | Status: DC
  Filled 2014-05-21: qty 0.5

## 2014-05-21 MED ORDER — ONDANSETRON HCL 4 MG PO TABS
4.0000 mg | ORAL_TABLET | Freq: Three times a day (TID) | ORAL | Status: DC | PRN
Start: 2014-05-21 — End: 2014-05-24

## 2014-05-21 MED ORDER — MAGNESIUM HYDROXIDE 400 MG/5ML PO SUSP: 30.0000 mL | Freq: Every day | ORAL | Status: DC | PRN

## 2014-05-21 MED ORDER — ASPIRIN EC 81 MG PO TBEC
81.0000 mg | DELAYED_RELEASE_TABLET | Freq: Every day | ORAL | Status: DC
  Administered 2014-05-22: 81 mg via ORAL
  Filled 2014-05-21 (×3): qty 1

## 2014-05-21 MED ORDER — GABAPENTIN 100 MG PO CAPS
100.0000 mg | ORAL_CAPSULE | Freq: Three times a day (TID) | ORAL | Status: DC
  Administered 2014-05-21 – 2014-05-22 (×2): 100 mg via ORAL
  Filled 2014-05-21 (×9): qty 1

## 2014-05-21 NOTE — Tx Team (Signed)
Initial Interdisciplinary Treatment Plan   PATIENT STRESSORS: Marital or family conflict Substance abuse   PATIENT STRENGTHS: Active sense of humor Average or above average intelligence General fund of knowledge Motivation for treatment/growth Supportive family/friends   PROBLEM LIST: Problem List/Patient Goals Date to be addressed Date deferred Reason deferred Estimated date of resolution  Risk for suicide 05/22/14     "work on moods" 05/22/14     ETOH abuse 05/22/14     depression 05/22/14                                    DISCHARGE CRITERIA:  Improved stabilization in mood, thinking, and/or behavior Verbal commitment to aftercare and medication compliance  PRELIMINARY DISCHARGE PLAN: Attend aftercare/continuing care group Outpatient therapy  PATIENT/FAMIILY INVOLVEMENT: This treatment plan has been presented to and reviewed with the patient, Jared [name].  The patient and family have been given the opportunity to ask questions and make suggestions.  Jared [name], Jared [name] 05/21/2014, 11:20 PM
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
## 2014-05-21 NOTE — [facility] Suicide Risk Assessment (Addendum)
[facility]   Nursing information obtained from:    Demographic factors:    Current Mental Status:    Loss Factors:    Historical Factors:    Risk Reduction Factors:    Total Time spent with patient: 45 minutes Principal Problem: Major depressive disorder, single episode, severe without psychotic features Diagnosis:   Patient Active Problem List   Diagnosis Date Noted  . Alcohol abuse [F10.10] 05/20/2014  . Depression [F32.9] 05/20/2014  . Suicide attempt [T14.91] 05/20/2014  . Suicidal ideations [R45.851]   . INGUINAL HERNIA [K40.90] 10/12/2009  . HEMORRHOIDS, WITH BLEEDING [K64.9] 09/28/2008  . HYPERLIPIDEMIA [E78.5] 08/23/2008  . ERECTILE DYSFUNCTION [F52.8] 07/26/2008  . HYPERTENSION [I10] 07/26/2008  . ALLERGIC RHINITIS [J30.9] 07/26/2008  . GERD [K21.9] 07/26/2008  . PEPTIC ULCER DISEASE [K27.9] 07/26/2008  . NONSPECIFIC ABNORMAL ELECTROCARDIOGRAM [R94.31] 07/26/2008     Continued Clinical Symptoms:    The "Alcohol Use Disorders Identification Test", Guidelines for Use in Primary Care, Second Edition.  World Science writerHealth Organization [facility](WHO). Score between 0-7:  no or low risk or alcohol related problems. Score between 8-15:  moderate risk of alcohol related problems. Score between 16-19:  high risk of alcohol related problems. Score 20 or above:  warrants further diagnostic evaluation for alcohol dependence and treatment.   CLINICAL FACTORS:   Depression:   Comorbid alcohol abuse/dependence Hopelessness Impulsivity Insomnia Recent sense of peace/wellbeing Alcohol/Substance Abuse/Dependencies Unstable or Poor Therapeutic Relationship   Musculoskeletal: Strength & Muscle Tone: within normal limits Gait & Station: normal Patient leans: N/A  Psychiatric Specialty Exam: Physical Exam  ROS  Blood pressure 152/90, pulse 105, resp. rate 18, height 5\' 9"  (1.753 m), weight 91.627 kg (202 lb).Body mass index is 29.82 kg/(m^2).  General  Appearance: Casual  Eye Contact::  Fair  Speech:  Pressured  Volume:  Normal  Mood:  Depressed, Dysphoric, Hopeless and Irritable  Affect:  Constricted and Depressed  Thought Process:  Goal Directed  Orientation:  Full (Time, Place, and Person)  Thought Content:  Rumination  Suicidal Thoughts:  Yes.  with intent/plan  Homicidal Thoughts:  No  Memory:  Immediate;   Fair Recent;   Fair Remote;   Fair  Judgement:  Impaired  Insight:  Lacking  Psychomotor Activity:  Increased  Concentration:  Fair  Recall:  FiservFair  Fund of Knowledge:Fair  Language: Fair  Akathisia:  No  Handed:  Right  AIMS (if indicated):     Assets:  Communication Skills Desire for Improvement Housing Social Support  Sleep:     Cognition: WNL  ADL's:  Intact     COGNITIVE FEATURES THAT CONTRIBUTE TO RISK:  Closed-mindedness, Loss of executive function and Polarized thinking    SUICIDE RISK:   Moderate:  Frequent suicidal ideation with limited intensity, and duration, some specificity in terms of plans, no associated intent, good self-control, limited dysphoria/symptomatology, some risk factors present, and identifiable protective factors, including available and accessible social support.  PLAN OF CARE: Patient is 54 year old Caucasian, married currently unemployed man who was admitted because of severe depression and threatening to shoot himself.  Patient has no previous psych history.  His major stressor is marital problem.  Upon admission his blood alcohol level was 161.  His other stressor is dealing with the loss of mother few years ago who suffered from stroke.  Patient appears very labile, irritable, hopeless and continued to persist hopeless and suicidal thoughts.  Patient requires inpatient psychiatric treatment.  He will start low-dose Lexapro to help  his mood and depression.  Encouraged to parts of it in group milieu therapy.  Please see complete psychiatric admission note for more details.  Medical  Decision Making:  New problem, with additional work up planned, Review of Psycho-Social Stressors (1), Review or order clinical lab tests (1), Review and summation of old records (2), Established Problem, Worsening (2), New Problem, with no additional work-up planned (3), Review of Medication Regimen & Side Effects (2) and Review of New Medication or Change in Dosage (2)  I certify that inpatient services furnished can reasonably be expected to improve the patient's condition.   Kewana Sanon T. 05/21/2014, 12:55 PM

## 2014-05-21 NOTE — BHH Counselor (Addendum)
Binnie RailJoann Glover, [facility], confirms adult unit is currently at capacity. Contacted the following facilities for placement:  PT ACCEPTED TO WAIT LIST: [facility]  INFORMATION ALREADY FAXED, [facility] REVIEW[facility]  BED AVAILABLE, FAXED CLINICAL INFORMATION: [facility], per Kipp BroodBrent  AT CAPACITY: [facility], per [facility], per [facility], [facility], [facility], per [facility], per [facility], per [facility], per Leatha Gildingavid Brynn Marr, per [facility], per [facility]  NO RESPONSE: [facility]   [address]Sasan Wilkie Ellis Patsy BaltimoreWarrick Jr, WisconsinLPC, [facility] Triage Specialist [phone]
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
## 2014-05-21 NOTE — Progress Notes (Signed)
The patient attended the evening Wrap-Up group and was appropriate.  
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
## 2014-05-21 NOTE — Progress Notes (Addendum)
Patient ID: Alger Simonsatrick Needham, male   DOB: 06/14/1960, 54 y.o.   MRN: [id number] Pt is a 54 year old voluntary admit pt who admits to drinking at least 4 glasses of wine a day. He stated he and his wife tend to argue a lot and he was really upest and went to a neighbors house to try to calm down. When he returned home he and his wife continued to argue about ,"something stupid involving my stepdaughters pants" . Pt stated ,"I got out my gun and we both were wrestling with it." Pt stated ,"I did say I should just kill myself but did not mean it." Pts medical history includes: HTN. He has NKDA.Pt does contract for safety and denies SI and HI. Pt stated that ,"My wife told me we were getting rid of the guns in the house. The problem is not the guns but my drinking." Pt was brought on the hall and taken to his room. Pt stated his wife told him he acts like two different people. She told him he can be  A nice person and then a really mean person.

## 2014-05-21 NOTE — Progress Notes (Signed)
D: Pt denies SI/HI/AVH. Pt is pleasant and cooperative. Pt stated he wants to get his mood right while here. " wife says I'm 2 different people"  A: Pt was offered support and encouragement. Pt was given scheduled medications. Pt was encourage to attend groups. Q 15 minute checks were done for safety.   R:Pt attends groups and interacts well with peers and staff. Pt is taking medication. Pt has no complaints at this time .Pt receptive to treatment and safety maintained on unit.

## 2014-05-21 NOTE — H&P (Signed)
Psychiatric Admission Assessment Adult  Patient Identification: Jared [name] MRN:  [id number] Date of Evaluation:  05/21/2014 Chief Complaint:  DEPRESSIVE DISORDER NOS Principal Diagnosis: Major depressive disorder, single episode, severe without psychotic features Diagnosis:   Patient Active Problem List   Diagnosis Date Noted  . Major depressive disorder, single episode, severe without psychotic features [F32.2]   . Alcohol abuse [F10.10] 05/20/2014  . Depression [F32.9] 05/20/2014  . Suicide attempt [T14.91] 05/20/2014  . Suicidal ideations [R45.851]   . INGUINAL HERNIA [K40.90] 10/12/2009  . HEMORRHOIDS, WITH BLEEDING [K64.9] 09/28/2008  . HYPERLIPIDEMIA [E78.5] 08/23/2008  . ERECTILE DYSFUNCTION [F52.8] 07/26/2008  . HYPERTENSION [I10] 07/26/2008  . ALLERGIC RHINITIS [J30.9] 07/26/2008  . GERD [K21.9] 07/26/2008  . PEPTIC ULCER DISEASE [K27.9] 07/26/2008  . NONSPECIFIC ABNORMAL ELECTROCARDIOGRAM [R94.31] 07/26/2008   History of Present Illness:  PER ED HPI:  54 y.o. male initially presenting to [facility] after threatening to shoot himself after an argument with his wife. Pt stated "my wife called the police on me; she's pissed off at me and I'll leave it at that". "There was no cause for this tonight but who the hell am I". "I don't want any more than that on any record". "I believe in God and I believe that I am here for a reason".   Pt denies SI at this time and stated "I am not going to kill myself, not today, tomorrow or ever".   Pt did not report any previous suicide attempts or psychiatric hospitalizations. Pt denied engaging in any self-injurious behaviors. Pt did not report any depressive symptoms or any stressors at this time. Pt did not share any issues with his sleep or appetite. Pt denies HI and AVH.  He has carry permit for firearms and states that "I am safe and I keep all my firearms safe and locked up."  Pt denied having any pending criminal charges or upcoming court  dates. Pt reported that he drinks red wine daily but did not report any drug use.  Pt BAL is 161. Pt is alert and oriented x3. Pt maintained fair eye contact throughout this assessment. Pt speech is normal and thought process is coherent and relevant.   Today on admission, patient was animated , pleasant.  He states that he has gotten in trouble more times with alcohol than having firearms.  He said that he has a short temper and that he has a "mouth on him".  He would like to see about maybe getting on a medication to take when he is discharged that will help him with agitation, even maybe seeing a therapist.  He denies SI/HI/AVH   HPI Elements: Location: generalized. Quality: acute. Severity: severe. Timing: intermittently. Duration: 48 hours. Context: stressors, drinking alcohol.  Associated Signs/Symptoms: Depression Symptoms:  irritable, agitation (Hypo) Manic Symptoms:  Irritable Mood, Anxiety Symptoms:  na Psychotic Symptoms:  NA PTSD Symptoms: NA Total Time spent with patient: 30 minutes  Past Medical History:  Past Medical History  Diagnosis Date  . Heart murmur   . Hypertension    History reviewed. No pertinent past surgical history. Family History:  Family History  Problem Relation Age of Onset  . Heart disease Mother   . Hypertension Mother   . Stroke Mother   . Heart disease Father    Social History:  History  Alcohol Use  . 10.8 oz/week  . 14 Not specified, 4 Glasses of wine per week     History  Drug Use No    History  Social History  . Marital Status: Married    Spouse Name: N/A    Number of Children: N/A  . Years of Education: N/A   Social History Main Topics  . Smoking status: Former Research scientist (life sciences)  . Smokeless tobacco: None  . Alcohol Use: 10.8 oz/week    14 Not specified, 4 Glasses of wine per week  . Drug Use: No  . Sexual Activity: None   Other Topics Concern  . None   Social History Narrative   Additional Social History:     History of alcohol / drug use?: Yes Negative Consequences of Use: Personal relationships  Musculoskeletal: Strength & Muscle Tone: within normal limits Gait & Station: normal Patient leans: N/A  Psychiatric Specialty Exam: Physical Exam  ROS  Blood pressure 152/90, pulse 105, temperature 98 F (36.7 C), temperature source Oral, resp. rate 18, height $RemoveBe'5\' 9"'QAPnamgCp$  (1.753 m), weight 91.627 kg (202 lb), SpO2 100 %.Body mass index is 29.82 kg/(m^2).  General Appearance: Fairly Groomed  Engineer, water::  Good  Speech:  Normal Rate  Volume:  Normal  Mood:  Irritable  Affect:  Appropriate  Thought Process:  Coherent  Orientation:  Full (Time, Place, and Person)  Thought Content:  Rumination  Suicidal Thoughts:  No  Homicidal Thoughts:  No  Memory:  Immediate;   Good Recent;   Good Remote;   Good  Judgement:  Good  Insight:  Good  Psychomotor Activity:  Normal  Concentration:  Good  Recall:  Good  Fund of Knowledge:Good  Language: Good  Akathisia:  NA  Handed:  Right  AIMS (if indicated):     Assets:  Communication Skills Desire for Improvement Resilience Social Support  ADL's:  Intact  Cognition: WNL  Sleep:      Risk to Self: What has been your use of drugs/alcohol within the last 12 months?: 4 glass of wine daily for the last 20 years Risk to Others:   Prior Inpatient Therapy:   Prior Outpatient Therapy:    Alcohol Screening: Patient refused Alcohol Screening Tool: Yes 1. How often do you have a drink containing alcohol?: 4 or more times a week 2. How many drinks containing alcohol do you have on a typical day when you are drinking?: 3 or 4 3. How often do you have six or more drinks on one occasion?: Daily or almost daily Preliminary Score: 5 4. How often during the last year have you found that you were not able to stop drinking once you had started?: Never 5. How often during the last year have you failed to do what was normally expected from you becasue of drinking?:  Never 6. How often during the last year have you needed a first drink in the morning to get yourself going after a heavy drinking session?: Never 7. How often during the last year have you had a feeling of guilt of remorse after drinking?: Less than monthly 8. How often during the last year have you been unable to remember what happened the night before because you had been drinking?: Daily or almost daily 9. Have you or someone else been injured as a result of your drinking?: No 10. Has a relative or friend or a doctor or another health worker been concerned about your drinking or suggested you cut down?: No Alcohol Use Disorder Identification Test Final Score (AUDIT): 14 Brief Intervention: Yes  Allergies:  No Known Allergies Lab Results:  Results for orders placed or performed during the hospital encounter of 05/19/14 (  from the past 48 hour(s))  Drug screen panel, emergency     Status: None   Collection Time: 05/19/14 11:55 PM  Result Value Ref Range   Opiates NONE DETECTED NONE DETECTED   Cocaine NONE DETECTED NONE DETECTED   Benzodiazepines NONE DETECTED NONE DETECTED   Amphetamines NONE DETECTED NONE DETECTED   Tetrahydrocannabinol NONE DETECTED NONE DETECTED   Barbiturates NONE DETECTED NONE DETECTED    Comment:        DRUG SCREEN FOR MEDICAL PURPOSES ONLY.  IF CONFIRMATION IS NEEDED FOR ANY PURPOSE, NOTIFY LAB WITHIN 5 DAYS.        LOWEST DETECTABLE LIMITS FOR URINE DRUG SCREEN Drug Class       Cutoff (ng/mL) Amphetamine      1000 Barbiturate      200 Benzodiazepine   627 Tricyclics       035 Opiates          300 Cocaine          300 THC              50   Comprehensive metabolic panel     Status: Abnormal   Collection Time: 05/19/14 11:59 PM  Result Value Ref Range   Sodium 141 135 - 145 mmol/L   Potassium 4.2 3.5 - 5.1 mmol/L   Chloride 108 96 - 112 mmol/L   CO2 24 19 - 32 mmol/L   Glucose, Bld 137 (H) 70 - 99 mg/dL   BUN 12 6 - 23 mg/dL   Creatinine, Ser 0.91  0.50 - 1.35 mg/dL   Calcium 9.0 8.4 - 10.5 mg/dL   Total Protein 7.3 6.0 - 8.3 g/dL   Albumin 4.6 3.5 - 5.2 g/dL   AST 39 (H) 0 - 37 U/L   ALT 47 0 - 53 U/L   Alkaline Phosphatase 82 39 - 117 U/L   Total Bilirubin 0.7 0.3 - 1.2 mg/dL   GFR calc non Af Amer >90 >90 mL/min   GFR calc Af Amer >90 >90 mL/min    Comment: (NOTE) The eGFR has been calculated using the CKD EPI equation. This calculation has not been validated in all clinical situations. eGFR's persistently <90 mL/min signify possible Chronic Kidney Disease.    Anion gap 9 5 - 15  Ethanol     Status: Abnormal   Collection Time: 05/19/14 11:59 PM  Result Value Ref Range   Alcohol, Ethyl (B) 161 (H) 0 - 9 mg/dL    Comment:        LOWEST DETECTABLE LIMIT FOR SERUM ALCOHOL IS 11 mg/dL FOR MEDICAL PURPOSES ONLY   Salicylate level     Status: None   Collection Time: 05/19/14 11:59 PM  Result Value Ref Range   Salicylate Lvl <0.0 2.8 - 20.0 mg/dL  Acetaminophen level     Status: Abnormal   Collection Time: 05/19/14 11:59 PM  Result Value Ref Range   Acetaminophen (Tylenol), Serum <10.0 (L) 10 - 30 ug/mL    Comment:        THERAPEUTIC CONCENTRATIONS VARY SIGNIFICANTLY. A RANGE OF 10-30 ug/mL MAY BE AN EFFECTIVE CONCENTRATION FOR MANY PATIENTS. HOWEVER, SOME ARE BEST TREATED AT CONCENTRATIONS OUTSIDE THIS RANGE. ACETAMINOPHEN CONCENTRATIONS >150 ug/mL AT 4 HOURS AFTER INGESTION AND >50 ug/mL AT 12 HOURS AFTER INGESTION ARE OFTEN ASSOCIATED WITH TOXIC REACTIONS.   CBC     Status: None   Collection Time: 05/19/14 11:59 PM  Result Value Ref Range   WBC 5.2 4.0 - 10.5 K/uL  RBC 4.42 4.22 - 5.81 MIL/uL   Hemoglobin 15.0 13.0 - 17.0 g/dL   HCT 42.3 39.0 - 52.0 %   MCV 95.7 78.0 - 100.0 fL   MCH 33.9 26.0 - 34.0 pg   MCHC 35.5 30.0 - 36.0 g/dL   RDW 12.4 11.5 - 15.5 %   Platelets 245 150 - 400 K/uL   Current Medications: Current Facility-Administered Medications  Medication Dose Route Frequency Provider Last  Rate Last Dose  . acetaminophen (TYLENOL) tablet 650 mg  650 mg Oral Q6H PRN Delfin Gant, NP      . alum & mag hydroxide-simeth (MAALOX/MYLANTA) 200-200-20 MG/5ML suspension 30 mL  30 mL Oral Q4H PRN Delfin Gant, NP      . amLODipine (NORVASC) tablet 5 mg  5 mg Oral Daily Delfin Gant, NP      . ibuprofen (ADVIL,MOTRIN) tablet 600 mg  600 mg Oral Q8H PRN Delfin Gant, NP      . irbesartan (AVAPRO) tablet 150 mg  150 mg Oral Daily Delfin Gant, NP      . LORazepam (ATIVAN) tablet 1 mg  1 mg Oral Q8H PRN Delfin Gant, NP      . magnesium hydroxide (MILK OF MAGNESIA) suspension 30 mL  30 mL Oral Daily PRN Delfin Gant, NP      . Derrill Memo ON 05/22/2014] nicotine (NICODERM CQ - dosed in mg/24 hours) patch 21 mg  21 mg Transdermal Daily Delfin Gant, NP      . ondansetron (ZOFRAN) tablet 4 mg  4 mg Oral Q8H PRN Delfin Gant, NP      . phenylephrine-shark liver oil-mineral oil-petrolatum (PREPARATION H) rectal ointment   Rectal BID PRN Delfin Gant, NP      . zolpidem (AMBIEN) tablet 5 mg  5 mg Oral QHS PRN Delfin Gant, NP       PTA Medications: Prescriptions prior to admission  Medication Sig Dispense Refill Last Dose  . phenylephrine-shark liver oil-mineral oil-petrolatum (PREPARATION H) 0.25-3-14-71.9 % rectal ointment Place 1 application rectally 2 (two) times daily as needed for hemorrhoids.   05/20/2014 at Unknown time  . amLODipine-olmesartan (AZOR) 5-20 MG per tablet Take 1 tablet by mouth daily.   05/18/2014 at Unknown time  . aspirin 81 MG tablet Take 81 mg by mouth daily.   05/18/2014 at Unknown time    Previous Psychotropic Medications: NO  Substance Abuse History in the last 12 months:  Yes.    Consequences of Substance Abuse: hospitalization  Results for orders placed or performed during the hospital encounter of 05/19/14 (from the past 72 hour(s))  Drug screen panel, emergency     Status: None   Collection Time:  05/19/14 11:55 PM  Result Value Ref Range   Opiates NONE DETECTED NONE DETECTED   Cocaine NONE DETECTED NONE DETECTED   Benzodiazepines NONE DETECTED NONE DETECTED   Amphetamines NONE DETECTED NONE DETECTED   Tetrahydrocannabinol NONE DETECTED NONE DETECTED   Barbiturates NONE DETECTED NONE DETECTED    Comment:        DRUG SCREEN FOR MEDICAL PURPOSES ONLY.  IF CONFIRMATION IS NEEDED FOR ANY PURPOSE, NOTIFY LAB WITHIN 5 DAYS.        LOWEST DETECTABLE LIMITS FOR URINE DRUG SCREEN Drug Class       Cutoff (ng/mL) Amphetamine      1000 Barbiturate      200 Benzodiazepine   998 Tricyclics       338  Opiates          300 Cocaine          300 THC              50   Comprehensive metabolic panel     Status: Abnormal   Collection Time: 05/19/14 11:59 PM  Result Value Ref Range   Sodium 141 135 - 145 mmol/L   Potassium 4.2 3.5 - 5.1 mmol/L   Chloride 108 96 - 112 mmol/L   CO2 24 19 - 32 mmol/L   Glucose, Bld 137 (H) 70 - 99 mg/dL   BUN 12 6 - 23 mg/dL   Creatinine, Ser 0.91 0.50 - 1.35 mg/dL   Calcium 9.0 8.4 - 10.5 mg/dL   Total Protein 7.3 6.0 - 8.3 g/dL   Albumin 4.6 3.5 - 5.2 g/dL   AST 39 (H) 0 - 37 U/L   ALT 47 0 - 53 U/L   Alkaline Phosphatase 82 39 - 117 U/L   Total Bilirubin 0.7 0.3 - 1.2 mg/dL   GFR calc non Af Amer >90 >90 mL/min   GFR calc Af Amer >90 >90 mL/min    Comment: (NOTE) The eGFR has been calculated using the CKD EPI equation. This calculation has not been validated in all clinical situations. eGFR's persistently <90 mL/min signify possible Chronic Kidney Disease.    Anion gap 9 5 - 15  Ethanol     Status: Abnormal   Collection Time: 05/19/14 11:59 PM  Result Value Ref Range   Alcohol, Ethyl (B) 161 (H) 0 - 9 mg/dL    Comment:        LOWEST DETECTABLE LIMIT FOR SERUM ALCOHOL IS 11 mg/dL FOR MEDICAL PURPOSES ONLY   Salicylate level     Status: None   Collection Time: 05/19/14 11:59 PM  Result Value Ref Range   Salicylate Lvl <2.1 2.8 - 20.0 mg/dL   Acetaminophen level     Status: Abnormal   Collection Time: 05/19/14 11:59 PM  Result Value Ref Range   Acetaminophen (Tylenol), Serum <10.0 (L) 10 - 30 ug/mL    Comment:        THERAPEUTIC CONCENTRATIONS VARY SIGNIFICANTLY. A RANGE OF 10-30 ug/mL MAY BE AN EFFECTIVE CONCENTRATION FOR MANY PATIENTS. HOWEVER, SOME ARE BEST TREATED AT CONCENTRATIONS OUTSIDE THIS RANGE. ACETAMINOPHEN CONCENTRATIONS >150 ug/mL AT 4 HOURS AFTER INGESTION AND >50 ug/mL AT 12 HOURS AFTER INGESTION ARE OFTEN ASSOCIATED WITH TOXIC REACTIONS.   CBC     Status: None   Collection Time: 05/19/14 11:59 PM  Result Value Ref Range   WBC 5.2 4.0 - 10.5 K/uL   RBC 4.42 4.22 - 5.81 MIL/uL   Hemoglobin 15.0 13.0 - 17.0 g/dL   HCT 42.3 39.0 - 52.0 %   MCV 95.7 78.0 - 100.0 fL   MCH 33.9 26.0 - 34.0 pg   MCHC 35.5 30.0 - 36.0 g/dL   RDW 12.4 11.5 - 15.5 %   Platelets 245 150 - 400 K/uL    Observation Level/Precautions:  15 minute checks  Laboratory:  per ED  Psychotherapy:  Group milieu  Medications:  As per medlist  Consultations:  As needed  Discharge Concerns:  Safety  Estimated LOS:  5-7 days  Other:     Psychological Evaluations: Yes  Treatment Plan Summary: Daily contact with patient to assess and evaluate symptoms and progress in treatment and Medication management  Treatment Plan/Recommendations:  Admit for crisis management and mood stabilization. Medication management to re-stabilize current  mood symptoms Group counseling sessions for coping skills Medical consults as needed Review and reinstate any pertinent home medications for other health problems  Medical Decision Making:  Established Problem, Stable/Improving (1), Review of Psycho-Social Stressors (1), Discuss test with performing physician (1), Review or order medicine tests (1), Review of Medication Regimen & Side Effects (2) and Review of New Medication or Change in Dosage (2)  I certify that inpatient services furnished can  reasonably be expected to improve the patient's condition.   Kerrie Buffalo MAY, AGNP-BC 1/24/20163:43 PM   I have personally seen the patient and agreed with the findings and involved in the treatment plan. Berniece Andreas, MD

## 2014-05-22 MED ORDER — AMLODIPINE BESYLATE 5 MG PO TABS
5.0000 mg | ORAL_TABLET | Freq: Two times a day (BID) | ORAL | Status: DC
  Filled 2014-05-22 (×2): qty 1

## 2014-05-22 MED ORDER — IRBESARTAN 150 MG PO TABS
150.0000 mg | ORAL_TABLET | Freq: Two times a day (BID) | ORAL | Status: DC
  Administered 2014-05-22 – 2014-05-24 (×4): 150 mg via ORAL
  Filled 2014-05-22: qty 28
  Filled 2014-05-22: qty 1
  Filled 2014-05-22: qty 28
  Filled 2014-05-22 (×5): qty 1

## 2014-05-22 MED ORDER — GABAPENTIN 300 MG PO CAPS
300.0000 mg | ORAL_CAPSULE | Freq: Three times a day (TID) | ORAL | Status: DC
  Administered 2014-05-22 – 2014-05-24 (×7): 300 mg via ORAL
  Filled 2014-05-22: qty 1
  Filled 2014-05-22: qty 42
  Filled 2014-05-22 (×5): qty 1
  Filled 2014-05-22: qty 42
  Filled 2014-05-22 (×4): qty 1
  Filled 2014-05-22: qty 42

## 2014-05-22 MED ORDER — ASPIRIN EC 81 MG PO TBEC
81.0000 mg | DELAYED_RELEASE_TABLET | Freq: Every day | ORAL | Status: DC
  Administered 2014-05-22 – 2014-05-23 (×2): 81 mg via ORAL
  Filled 2014-05-22 (×4): qty 1

## 2014-05-22 MED ORDER — AMLODIPINE BESYLATE 5 MG PO TABS
5.0000 mg | ORAL_TABLET | Freq: Two times a day (BID) | ORAL | Status: DC
  Administered 2014-05-22 – 2014-05-24 (×4): 5 mg via ORAL
  Filled 2014-05-22 (×6): qty 1
  Filled 2014-05-22 (×2): qty 28

## 2014-05-22 NOTE — [facility] Suicide Risk Assessment (Signed)
[facility]   Nursing information obtained from:  Patient Demographic factors:  Male, Caucasian Current Mental Status:    Loss Factors:    Historical Factors:    Risk Reduction Factors:    Total Time spent with patient: 30 minutes Principal Problem: Major depressive disorder, single episode, severe without psychotic features Diagnosis:   Patient Active Problem List   Diagnosis Date Noted  . Major depressive disorder, single episode, severe without psychotic features [F32.2]   . Alcohol abuse [F10.10] 05/20/2014  . Depression [F32.9] 05/20/2014  . Suicide attempt [T14.91] 05/20/2014  . Suicidal ideations [R45.851]   . INGUINAL HERNIA [K40.90] 10/12/2009  . HEMORRHOIDS, WITH BLEEDING [K64.9] 09/28/2008  . HYPERLIPIDEMIA [E78.5] 08/23/2008  . ERECTILE DYSFUNCTION [F52.8] 07/26/2008  . HYPERTENSION [I10] 07/26/2008  . ALLERGIC RHINITIS [J30.9] 07/26/2008  . GERD [K21.9] 07/26/2008  . PEPTIC ULCER DISEASE [K27.9] 07/26/2008  . NONSPECIFIC ABNORMAL ELECTROCARDIOGRAM [R94.31] 07/26/2008     Continued Clinical Symptoms:  Alcohol Use Disorder Identification Test Final Score (AUDIT): 14 The "Alcohol Use Disorders Identification Test", Guidelines for Use in Primary Care, Second Edition.  World Science [employer] [facility](WHO). Score between 0-7:  no or low risk or alcohol related problems. Score between 8-15:  moderate risk of alcohol related problems. Score between 16-19:  high risk of alcohol related problems. Score 20 or above:  warrants further diagnostic evaluation for alcohol dependence and treatment.   CLINICAL FACTORS:   Depression:   Comorbid alcohol abuse/dependence Impulsivity Alcohol/Substance Abuse/Dependencies   Musculoskeletal: Strength & Muscle Tone: within normal limits Gait & Station: normal Patient leans: N/A  Psychiatric Specialty Exam: Physical Exam  Review of Systems  Constitutional: Negative.   HENT: Negative.   Eyes: Negative.    Respiratory: Negative.   Cardiovascular: Negative.   Gastrointestinal: Negative.   Genitourinary: Negative.   Musculoskeletal: Negative.   Skin: Negative.   Neurological: Negative.   Endo/Heme/Allergies: Negative.   Psychiatric/Behavioral: Positive for depression and substance abuse. The patient is nervous/anxious.     Blood pressure 148/89, pulse 85, temperature 97.9 F (36.6 C), temperature source Oral, resp. rate 20, height 5\' 9"  (1.753 m), weight 91.627 kg (202 lb), SpO2 100 %.Body mass index is 29.82 kg/(m^2).  General Appearance: Fairly Groomed  Patent attorneyye Contact::  Fair  Speech:  Clear and Coherent  Volume:  fluctuates  Mood:  Anxious, Depressed and Irritable  Affect:  anxious worried irritated  Thought Process:  Coherent and Goal Directed  Orientation:  Full (Time, Place, and Person)  Thought Content:  symptoms events worries concerns  Suicidal Thoughts:  No  Homicidal Thoughts:  No  Memory:  Immediate;   Fair Recent;   Fair Remote;   Fair  Judgement:  Fair  Insight:  Present and Shallow  Psychomotor Activity:  Restlessness  Concentration:  Fair  Recall:  FiservFair  Fund of Knowledge:Fair  Language: Fair  Akathisia:  No  Handed:  Right  AIMS (if indicated):     Assets:  Desire for Improvement Housing  Sleep:  Number of Hours: 6.25  Cognition: WNL  ADL's:  Intact     COGNITIVE FEATURES THAT CONTRIBUTE TO RISK:  Closed-mindedness, Polarized thinking and Thought constriction (tunnel vision)    SUICIDE RISK:   Moderate:  PLAN OF CARE: See progress note 05/22/2014  Medical Decision Making:  Review of Psycho-Social Stressors (1), Review or order clinical lab tests (1), Review of Medication Regimen & Side Effects (2) and Review of New Medication or Change in Dosage (2)  I certify  that inpatient services furnished can reasonably be expected to improve the patient's condition.   Hadasah Brugger A 05/22/2014, 7:51 PM

## 2014-05-22 NOTE — Progress Notes (Signed)
D: Pt. Presented anxious this morning.  He was concerned about his blood pressure medication being correct stating if it is not correct "my blood pressure will definitely go up."  Pt. Became less anxious as the day went on.  He reported that his medication Neurontin seemed to be helping him.  A: Support/encouragement.  R: Pt. Contracts for safety.

## 2014-05-22 NOTE — Progress Notes (Signed)
[facility]  05/22/2014 4:33 PM Jared [name]  MRN:  [id number] Subjective:  States that the episode that got him here was the result of an event at home that escalated and him drinking. States that he usually does not drink like this. He states he would never do anything to hurt his family or himself. States that he is very careful and responsible with his guns. States that his wife has unresolved issues that she needs to address and that they both need to go to counseling. He admits that in the past when they have had arguments like this, alcohol was involved. He is willing to abstain and willing to go to counseling. He will want her to quit drinking too. He wants to go back with his wife, but not sure about her wanting to be back with him. He admits he gets irritable usually in the afternoon. He would accept any help that he can get with this Principal Problem: Major depressive disorder, single episode, severe without psychotic features Diagnosis:   Patient Active Problem List   Diagnosis Date Noted  . Major depressive disorder, single episode, severe without psychotic features [F32.2]   . Alcohol abuse [F10.10] 05/20/2014  . Depression [F32.9] 05/20/2014  . Suicide attempt [T14.91] 05/20/2014  . Suicidal ideations [R45.851]   . INGUINAL HERNIA [K40.90] 10/12/2009  . HEMORRHOIDS, WITH BLEEDING [K64.9] 09/28/2008  . HYPERLIPIDEMIA [E78.5] 08/23/2008  . ERECTILE DYSFUNCTION [F52.8] 07/26/2008  . HYPERTENSION [I10] 07/26/2008  . ALLERGIC RHINITIS [J30.9] 07/26/2008  . GERD [K21.9] 07/26/2008  . PEPTIC ULCER DISEASE [K27.9] 07/26/2008  . NONSPECIFIC ABNORMAL ELECTROCARDIOGRAM [R94.31] 07/26/2008   Total Time spent with patient: 30 minutes   Past Medical History:  Past Medical History  Diagnosis Date  . Heart murmur   . Hypertension    History reviewed. No pertinent past surgical history. Family History:  Family History  Problem Relation Age of Onset  . Heart disease Mother    . Hypertension Mother   . Stroke Mother   . Heart disease Father    Social History:  History  Alcohol Use  . 10.8 oz/week  . 14 Not specified, 4 Glasses of wine per week     History  Drug Use No    History   Social History  . Marital Status: Married    Spouse Name: N/A    Number of Children: N/A  . Years of Education: N/A   Social History Main Topics  . Smoking status: Former Games developermoker  . Smokeless tobacco: None  . Alcohol Use: 10.8 oz/week    14 Not specified, 4 Glasses of wine per week  . Drug Use: No  . Sexual Activity: None   Other Topics Concern  . None   Social History Narrative   Additional History:    Sleep: Fair  Appetite:  Fair   Assessment:   Musculoskeletal: Strength & Muscle Tone: within normal limits Gait & Station: normal Patient leans: N/A   Psychiatric Specialty Exam: Physical Exam  Review of Systems  Constitutional: Negative.   HENT: Negative.   Eyes: Negative.   Respiratory: Negative.   Cardiovascular: Negative.   Gastrointestinal: Negative.   Genitourinary: Negative.   Musculoskeletal: Negative.   Skin: Negative.   Neurological: Negative.   Endo/Heme/Allergies: Negative.   Psychiatric/Behavioral: Positive for substance abuse. The patient is nervous/anxious.     Blood pressure 148/89, pulse 85, temperature 97.9 F (36.6 C), temperature source Oral, resp. rate 20, height 5\' 9"  (1.753 m), weight 91.627 kg (  202 lb), SpO2 100 %.Body mass index is 29.82 kg/(m^2).  General Appearance: Fairly Groomed  Patent attorney::  Fair  Speech:  Clear and Coherent  Volume:  fluctuates  Mood:  Anxious, Irritable and worried  Affect:  anxious worried irritable  Thought Process:  Coherent and Goal Directed  Orientation:  Full (Time, Place, and Person)  Thought Content:  events worries concerns  Suicidal Thoughts:  No  Homicidal Thoughts:  No  Memory:  Immediate;   Fair Recent;   Fair Remote;   Fair  Judgement:  Fair  Insight:  Present   Psychomotor Activity:  Restlessness  Concentration:  Fair  Recall:  [employer] of Knowledge:Fair  Language: Fair  Akathisia:  No  Handed:  Right  AIMS (if indicated):     Assets:  Desire for Improvement Housing Transportation  ADL's:  Intact  Cognition: WNL  Sleep:  Number of Hours: 6.25     Current Medications: Current Facility-Administered Medications  Medication Dose Route Frequency Provider Last Rate Last Dose  . acetaminophen (TYLENOL) tablet 650 mg  650 mg Oral Q6H PRN Earney Navy, NP      . alum & mag hydroxide-simeth (MAALOX/MYLANTA) 200-200-20 MG/5ML suspension 30 mL  30 mL Oral Q4H PRN Earney Navy, NP      . amLODipine (NORVASC) tablet 5 mg  5 mg Oral BID Rachael Fee, MD      . aspirin EC tablet 81 mg  81 mg Oral Daily Rachael Fee, MD      . gabapentin (NEURONTIN) capsule 300 mg  300 mg Oral TID Rachael Fee, MD   300 mg at 05/22/14 1630  . ibuprofen (ADVIL,MOTRIN) tablet 600 mg  600 mg Oral Q8H PRN Earney Navy, NP      . Influenza vac split quadrivalent PF (FLUARIX) injection 0.5 mL  0.5 mL Intramuscular Tomorrow-1000 Fernando A Cobos, MD      . irbesartan (AVAPRO) tablet 150 mg  150 mg Oral BID AC & HS Rachael Fee, MD      . LORazepam (ATIVAN) tablet 1 mg  1 mg Oral Q8H PRN Earney Navy, NP   1 mg at 05/21/14 2244  . magnesium hydroxide (MILK OF MAGNESIA) suspension 30 mL  30 mL Oral Daily PRN Earney Navy, NP      . nicotine (NICODERM CQ - dosed in mg/24 hours) patch 21 mg  21 mg Transdermal Daily Earney Navy, NP   21 mg at 05/22/14 0837  . ondansetron (ZOFRAN) tablet 4 mg  4 mg Oral Q8H PRN Earney Navy, NP      . phenylephrine-shark liver oil-mineral oil-petrolatum (PREPARATION H) rectal ointment   Rectal BID PRN Earney Navy, NP      . zolpidem (AMBIEN) tablet 5 mg  5 mg Oral QHS PRN Earney Navy, NP        Lab Results: No results found for this or any previous visit (from the past 48  hour(s)).  Physical Findings: AIMS:  , ,  ,  ,    CIWA:    COWS:     Treatment Plan Summary: Daily contact with patient to assess and evaluate symptoms and progress in treatment and Medication management Alcohol Abuse: will work on identifying triggers and develop a relapse prevention plan Mood Instability: will continue to work with the Neurontin and increase the dose to 300 mg TID Hypertension: will increase the Norvasc and the Avapro as states that when  he cant get the Azor, these medications have  to be given BID "I don't want to have a stroke" Marital Conflict: SW will call his wife to clarify further and to make sure guns are secured Medical Decision Making:  New problem, with additional work up planned, Review of Psycho-Social Stressors (1), Review or order clinical lab tests (1), Review of Medication Regimen & Side Effects (2) and Review of New Medication or Change in Dosage (2) Problem Points:  New problem, with additional work-up planned (4) and Review of psycho-social stressors (1) Data Points:  Review of medication regiment & side effects (2) Review of new medications or change in dosage (2)    Akshitha Culmer A 05/22/2014, 4:33 PM

## 2014-05-22 NOTE — [facility] Suicide Risk Assessment (Signed)
[facility] INPATIENT:  Family/Significant Other Suicide Prevention Education  Suicide Prevention Education:  Education Completed; Wife Jared [name] [phone],  (name of family member/significant other) has been identified by the patient as the family member/significant other with whom the patient will be residing, and identified as the person(s) who will aid the patient in the event of a mental health crisis (suicidal ideations/suicide attempt).  With written consent from the patient, the family member/significant other has been provided the following suicide prevention education, prior to the and/or following the discharge of the patient.  The suicide prevention education provided includes the following:  Suicide risk factors  Suicide prevention and interventions  National Suicide Hotline telephone number  [facility] assessment telephone number  [facility] Emergency Assistance 911  [facility] and/or Residential Mobile Crisis Unit telephone number  Request made of family/significant other to:  Remove weapons (e.g., guns, rifles, knives), all items previously/currently identified as safety concern.    Remove drugs/medications (over-the-counter, prescriptions, illicit drugs), all items previously/currently identified as a safety concern.  The family member/significant other verbalizes understanding of the suicide prevention education information provided.  The family member/significant other agrees to remove the items of safety concern listed above.  Jared [name], [place] CarboKristin [name] 05/22/2014, 3:39 PM
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
## 2014-05-22 NOTE — [facility] Group Notes (Signed)
[facility] LCSW Group Therapy 05/22/2014  1:15 pm  Type of Therapy: Group Therapy Participation Level: Active  Participation Quality: Attentive, Sharing and Supportive  Affect: Appropriate, agitated at times  Cognitive: Alert and Oriented  Insight: Developing/Improving and Engaged  Engagement in Therapy: Developing/Improving and Engaged  Modes of Intervention: Clarification, Confrontation, Discussion, Education, Exploration,  Limit-setting, Orientation, Problem-solving, Rapport Building, Dance movement psychotherapisteality Testing, Socialization and Support  Summary of Progress/Problems: Pt identified obstacles faced currently and processed barriers involved in overcoming these obstacles. Pt identified steps necessary for overcoming these obstacles and explored motivation (internal and external) for facing these difficulties head on. Pt further identified one area of concern in their lives and chose a goal to focus on for today. Patient identified relationship issues and dysfunctional communication with his wife as an obstacle. Patient tended to discuss wife's perception of "problem" and her faulty communication patterns, while CSW redirected discussion back to patient's behaviors. Patient reports that he is interested in going to individual and couples counseling at this time but is unsure if his wife is willing to engage in counseling as well. CSW and other group members provided emotional support and encouragement to patient.  Samuella BruinKristin Tierra Divelbiss, MSW, [employer]CSWA Clinical Social Worker [facility] [phone]
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
## 2014-05-23 NOTE — [facility] Group Notes (Signed)
[facility] LCSW Group Therapy 05/23/2014  1:15 PM   Type of Therapy: Group Therapy  Participation Level: Did Not Attend. Patient invited to attend but declined.   Samuella BruinKristin Romell Cavanah, MSW, [employer]CSWA Clinical Social Worker [facility] [phone]

## 2014-05-23 NOTE — [facility] Group Notes (Signed)
0900 nursing orientation group    The focus of this group is to educate the patient on the purpose and policies of crisis stabilization and provide a format to answer questions about their admission.  The group details unit policies and expectations of patients while admitted.  Pt was an active participant in sharing with the group and appropriate with his responses.  

## 2014-05-23 NOTE — Progress Notes (Signed)
Pt attended spiritual caregroupongriefand loss facilitated by chaplain Burnis KingfisherMatthew Stalnaker and counseling intern Jared Vinton Layson. Groupopened with brief discussion and psycho-social ed aroundgriefand loss in relationships and in relation to self - identifying life patterns, circumstances, changes that cause losses. Establishedgroupnorm of speaking from own life experience.Groupgoal of establishing open and affirming space for members to share loss and experience withgrief, normalizegriefexperience and provide psycho social education andgriefsupport.Groupdrew on narrative and Alderian therapeutic modalities.  Jared [name] identified with fear in grief and how this related to his experience caregiving for his mother. Described to the group process of being a sole caregiver for his mother, and her choice to not go to the hospital at the end of her life. Expressed anger towards family members, specifically lack of their support. Stated difficulties in relationship with his wife, and was able to identify experience of anger but was unsure of the cause for anger. Worried he would end up losing wife because he recently has been pushing her away, and intended to call and apologize to wife after group.   Jared [name]  Counseling Intern

## 2014-05-23 NOTE — Plan of Care (Signed)
Problem: Diagnosis: Increased Risk For Suicide Attempt Goal: STG-Patient Will Report Suicidal Feelings to Staff Outcome: Progressing Pt denied any suicidal ideation on his self-inventory and does verbally contract to come to staff before acting on any self-harm thoughts.        

## 2014-05-23 NOTE — Progress Notes (Signed)
Pt has been up and active in the milieu today.  He denied any depression or hopelessness but rated his anxiety a 1 on his self-inventory.  He did sign a 72 hour release on 05/22/2014 at 1425.  He denied any S/H ideation or A/V/H.  He has been intrusive and needy talking about his issues with his wife.  He stated,"I don't understand why I have to be last all the time". He is hoping to go to marriage counseling he has been married about 9 years and she has 2 children and he has none.  He has been labile and irritable much of the day.

## 2014-05-23 NOTE — Progress Notes (Signed)
Recreation Therapy Notes  Animal-Assisted Activity/Therapy (AAA/T) Program Checklist/Progress Notes Patient Eligibility Criteria Checklist & Daily Group note for Rec Tx Intervention  Date: 01.26.2016 Time: 2:45pm [address]: [address]   AAA/T Program Assumption of Risk Form signed by Patient/ or Parent Legal Guardian yes  Patient is free of allergies or sever asthma yes  Patient reports no fear of animals yes  Patient reports no history of cruelty to animals yes  Patient understands his/her participation is voluntary yes  Patient washes hands before animal contact yes  Patient washes hands after animal contact yes  Behavioral Response: Appropriate, Engaged, Sharing.   Education: Charity fundraiserHand Washing, Health visitorAppropriate Animal Interaction   Education Outcome: Acknowledges education.   Clinical Observations/Feedback: Patient actively engaged in session, petting therapy dog appropriately, asking appropriate questions about therapy dog and his training and sharing stories about his pets at home.   Jared [name], Jared [name]  [name] L 05/23/2014 4:40 PM

## 2014-05-23 NOTE — Progress Notes (Signed)
D: Pt denies SI/HI/AVH. Pt is pleasant and cooperative. Pt happy and seen interacting on unit. Pt plans on going to AA and marriage counseling. Pt felt better he talked to his wife, because he thought she would not visit him in the hospital.   A: Pt was offered support and encouragement. Pt was given scheduled medications. Pt was encourage to attend groups. Q 15 minute checks were done for safety.   R:Pt attends groups and interacts well with peers and staff. Pt is taking medication. Pt has no complaints at this time .Pt receptive to treatment and safety maintained on unit.

## 2014-05-23 NOTE — Tx Team (Signed)
Interdisciplinary Treatment Plan Update (Adult) Date: 05/23/2014   Time Reviewed: 9:30 AM  Progress in Treatment: Attending groups: Yes Participating in groups: Yes Taking medication as prescribed: Yes Tolerating medication: Yes Family/Significant other contact made: Yes, CSW has spoken with patient's wife Patient understands diagnosis: Yes Discussing patient identified problems/goals with staff: Yes Medical problems stabilized or resolved: Yes Denies suicidal/homicidal ideation: Yes Issues/concerns per patient self-inventory: Yes Other:  New problem(s) identified: N/A  Discharge Plan or Barriers:   1/26: Patient plans to return home at discharge and is agreeable to referral for outpatient services. Patient's wife informed CSW that she does not want patient to return home if he is unwilling to remove guns from home. Per MD, patient is agreeable to secure guns in order to return home with family. He is interested in individual and couples counseling.  Reason for Continuation of Hospitalization:  Depression Anxiety Medication Stabilization   Comments: N/A  Estimated length of stay: 1-2 days  For review of initial/current patient goals, please see plan of care. Patient is a 54 year old Caucasian Male with a diagnosis of Major depressive disorder, single episode, severe without psychotic features. [place] and children. Patient hospitalized following an incident in which he pulled out a gun and threatened suicide after becoming intoxicated. Patient admits that alcohol is an issue for him but declines residential treatment at this time. Patient will benefit from crisis stabilization, medication evaluation, group therapy, and psycho education in addition to case management for discharge planning. Patient and CSW reviewed pt's identified goals and treatment plan. Pt verbalized understanding and agreed to treatment plan.   Attendees: Patient:     Family:    Physician: Dr. Jama Flavorsobos; Dr. Dub MikesLugo 05/23/2014 9:30 AM  Nursing: Robbie LouisVivian Kent; Quintella ReichertBeverly Knight, RN 05/23/2014 9:30 AM  Clinical Social Worker: Samuella BruinKristin Jadarious Dobbins,  LCSWA 05/23/2014 9:30 AM  Other: Juline PatchQuylle Hodnett, LCSW 05/23/2014 9:30 AM  Other: Leisa LenzValerie Enoch, Vesta MixerMonarch Liaison 05/23/2014 9:30 AM  Other: Onnie BoerJennifer Clark, Case Manager 05/23/2014 9:30 AM  Other: Mosetta AnisAggie Nwoko; Laura Davis, NP 05/23/2014 9:30 AM  Other: Trula SladeHeather Smart, LCSWA 05/23/2014 9:30 AM  Other:    Other:       Scribe for Treatment Team:  Samuella BruinKristin Jenesys Casseus, MSW, [employer]LCSWA [phone]

## 2014-05-24 ENCOUNTER — Encounter (HOSPITAL_COMMUNITY): Payer: Self-pay | Admitting: Registered Nurse

## 2014-05-24 MED ORDER — GABAPENTIN 300 MG PO CAPS: 300.0000 mg | ORAL_CAPSULE | Freq: Three times a day (TID) | ORAL | Status: DC

## 2014-05-24 MED ORDER — AMLODIPINE BESYLATE 5 MG PO TABS: 5.0000 mg | ORAL_TABLET | Freq: Two times a day (BID) | ORAL | Status: DC

## 2014-05-24 MED ORDER — IRBESARTAN 150 MG PO TABS: 150.0000 mg | ORAL_TABLET | Freq: Two times a day (BID) | ORAL | Status: DC

## 2014-05-24 MED ORDER — ZOLPIDEM TARTRATE 5 MG PO TABS: 5.0000 mg | ORAL_TABLET | Freq: Every evening | ORAL | Status: DC | PRN

## 2014-05-24 MED ORDER — AMLODIPINE-OLMESARTAN 5-20 MG PO TABS: 1.0000 | ORAL_TABLET | Freq: Two times a day (BID) | ORAL | Status: DC

## 2014-05-24 NOTE — B[facility] Suicide Risk Assessment (Signed)
[facility] Discharge Suicide Risk Assessment   Demographic Factors:  Male and Caucasian  Total Time spent with patient: 30 minutes  Musculoskeletal: Strength & Muscle Tone: within normal limits Gait & Station: normal Patient leans: N/A  Psychiatric Specialty Exam: Physical Exam  Review of Systems  Constitutional: Negative.   HENT: Negative.   Eyes: Negative.   Respiratory: Negative.   Cardiovascular: Negative.   Gastrointestinal: Negative.   Genitourinary: Negative.   Musculoskeletal: Negative.   Skin: Negative.   Neurological: Negative.   Endo/Heme/Allergies: Negative.   Psychiatric/Behavioral: The patient is nervous/anxious.     Blood pressure 138/67, pulse 65, temperature 97.5 F (36.4 C), temperature source Oral, resp. rate 17, height  (1.753 m), weight 91.627 kg (202 lb), SpO2 100 %.Body mass index is 29.82 kg/(m^2).  General Appearance: Fairly Groomed  Patent attorney::  Fair  Speech:  Clear and Coherent409  Volume:  Normal  Mood:  Anxious  Affect:  Appropriate  Thought Process:  Coherent and Goal Directed  Orientation:  Full (Time, Place, and Person)  Thought Content:  plas as he moves on, relapse prevention plan  Suicidal Thoughts:  No  Homicidal Thoughts:  No  Memory:  Immediate;   Fair Recent;   Fair Remote;   Fair  Judgement:  Fair  Insight:  Present  Psychomotor Activity:  Restlessness  Concentration:  Fair  Recall:  [employer] of Knowledge:Fair  Language: Fair  Akathisia:  No  Handed:  Right  AIMS (if indicated):     Assets:  Desire for Improvement Housing Social Support  Sleep:  Number of Hours: 5.25  Cognition: WNL  ADL's:  Intact   Have you used any form of tobacco in the last 30 days? (Cigarettes, Smokeless Tobacco, Cigars, and/or Pipes): No  Has this patient used any form of tobacco in the last 30 days? (Cigarettes, Smokeless Tobacco, Cigars, and/or Pipes) No  Mental Status Per Nursing Assessment::   On Admission:     Current Mental  Status by Physician: In full contact with reality. There are no active SI plans or intent. He admits he had an epiphany during the last grief-loss group. He realized that he has been trough a lot of stressful events, losses trough the last several years and he has not dealt with them. He has kept all this inside, he gets angry about them and projects the anger towards his wife. States they are both committed to make the relationship work. They are going to both quit drinking and pursue psychotherapy. The guns have been secured and he is OK to give control of the guns to his wife   Loss Factors: Loss of significant relationship  Historical Factors: NA  Risk Reduction Factors:   Sense of responsibility to family, Living with another person, especially a relative and Positive social support  Continued Clinical Symptoms:  Depression:   Comorbid alcohol abuse/dependence Impulsivity  Cognitive Features That Contribute To Risk:  Closed-mindedness, Polarized thinking and Thought constriction (tunnel vision)    Suicide Risk:  Minimal: No identifiable suicidal ideation.  Patients presenting with no risk factors but with morbid ruminations; may be classified as minimal risk based on the severity of the depressive symptoms  Principal Problem: Major depressive disorder, single episode, severe without psychotic features Discharge Diagnoses:  Patient Active Problem List   Diagnosis Date Noted  . Major depressive disorder, single episode, severe without psychotic features [F32.2]   . Alcohol abuse [F10.10] 05/20/2014  . Depression [F32.9] 05/20/2014  . Suicide attempt [T14.91]  05/20/2014  . Suicidal ideations [R45.851]   . INGUINAL HERNIA [K40.90] 10/12/2009  . HEMORRHOIDS, WITH BLEEDING [K64.9] 09/28/2008  . HYPERLIPIDEMIA [E78.5] 08/23/2008  . ERECTILE DYSFUNCTION [F52.8] 07/26/2008  . HYPERTENSION [I10] 07/26/2008  . ALLERGIC RHINITIS [J30.9] 07/26/2008  . GERD [K21.9] 07/26/2008  . PEPTIC  ULCER DISEASE [K27.9] 07/26/2008  . NONSPECIFIC ABNORMAL ELECTROCARDIOGRAM [R94.31] 07/26/2008    Follow-up Information    [facility]  On [facility]2014.   Why:  Therapy appointment with Tomma LightningFrankie on Wednesday, Feb. 3rd at 8 am. Please bring new patient paperwork to appointment and call office if you need to reschedule.   Contact information:   [address] [place] [address] [phone]      [facility] On 06/[facility]/2014.   Why:  Medication management appointment with Dr. Michae KavaAgarwal on Thursday, March 3rd at 9 am. Please arrive at 8:30 am and call office if you need to reschedule.    Contact information:   [address] [place] [address] [phone]      Plan Of Care/Follow-up recommendations:  Activity:  as tolerated Diet:  regular Follow [facility] Is patient on multiple antipsychotic therapies at discharge:  No   Has Patient had three or more failed trials of antipsychotic monotherapy by history:  No  Recommended Plan for Multiple Antipsychotic Therapies: NA    Eimy Plaza A 05/24/2014, 12:24 PM

## 2014-05-24 NOTE — Discharge Summary (Signed)
Physician Discharge Summary Note  Patient:  Jared [name] is an 54 y.o., male MRN:  [id number] DOB:  Nov 26, 1960 Patient phone:  [phone] (home)  Patient address:   [address] [place] [address],  Total Time spent with patient: Greater than 30 minutes  Date of Admission:  05/21/2014 Date of Discharge: 05/24/2014  Reason for Admission:  54 y.o. male initially presenting to [facility] after threatening to shoot himself after an argument with his wife. Pt stated "my wife called the police on me; she's pissed off at me and I'll leave it at that". "There was no cause for this tonight but who the hell am I". "I don't want any more than that on any record". "I believe in God and I believe that I am here for a reason". Pt denies SI at this time and stated "I am not going to kill myself, not today, tomorrow or ever".   Pt did not report any previous suicide attempts or psychiatric hospitalizations. Pt denied engaging in any self-injurious behaviors. Pt did not report any depressive symptoms or any stressors at this time. Pt did not share any issues with his sleep or appetite. Pt denies HI and AVH. He has carry permit for firearms and states that "I am safe and I keep all my firearms safe and locked up." Pt denied having any pending criminal charges or upcoming court dates. Pt reported that he drinks red wine daily but did not report any drug use. Pt BAL is 161. Pt is alert and oriented x3. Pt maintained fair eye contact throughout this assessment. Pt speech is normal and thought process is coherent and relevant.   Today on admission, patient was animated , pleasant. He states that he has gotten in trouble more times with alcohol than having firearms. He said that he has a short temper and that he has a "mouth on him". He would like to see about maybe getting on a medication to take when he is discharged that will help him with agitation, even maybe seeing a therapist. He denies  SI/HI/AVH  Principal Problem: Major depressive disorder, single episode, severe without psychotic features Discharge Diagnoses: Patient Active Problem List   Diagnosis Date Noted  . Major depressive disorder, single episode, severe without psychotic features [F32.2]   . Alcohol abuse [F10.10] 05/20/2014  . Depression [F32.9] 05/20/2014  . Suicide attempt [T14.91] 05/20/2014  . Suicidal ideations [R45.851]   . INGUINAL HERNIA [K40.90] 10/12/2009  . HEMORRHOIDS, WITH BLEEDING [K64.9] 09/28/2008  . HYPERLIPIDEMIA [E78.5] 08/23/2008  . ERECTILE DYSFUNCTION [F52.8] 07/26/2008  . HYPERTENSION [I10] 07/26/2008  . ALLERGIC RHINITIS [J30.9] 07/26/2008  . GERD [K21.9] 07/26/2008  . PEPTIC ULCER DISEASE [K27.9] 07/26/2008  . NONSPECIFIC ABNORMAL ELECTROCARDIOGRAM [R94.31] 07/26/2008    Musculoskeletal: Strength & Muscle Tone: within normal limits Gait & Station: normal Patient leans: N/A  Psychiatric Specialty Exam:  See Suicide Risk Assessment Physical Exam  Review of Systems  Psychiatric/Behavioral: Negative for suicidal ideas. Depression: Stable.    Blood pressure 151/83, pulse 63, temperature 97.5 F (36.4 C), temperature source Oral, resp. rate 17, height 5\' 9"  (1.753 m), weight 91.627 kg (202 lb), SpO2 100 %.Body mass index is 29.82 kg/(m^2).    Past Medical History:  Past Medical History  Diagnosis Date  . Heart murmur   . Hypertension    History reviewed. No pertinent past surgical history. Family History:  Family History  Problem Relation Age of Onset  . Heart disease Mother   . Hypertension Mother   .  Stroke Mother   . Heart disease Father    Social History:  History  Alcohol Use  . 10.8 oz/week  . 14 Not specified, 4 Glasses of wine per week     History  Drug Use No    History   Social History  . Marital Status: Married    Spouse Name: N/A    Number of Children: N/A  . Years of Education: N/A   Social History Main Topics  . Smoking status: Former  Games developer  . Smokeless tobacco: None  . Alcohol Use: 10.8 oz/week    14 Not specified, 4 Glasses of wine per week  . Drug Use: No  . Sexual Activity: None   Other Topics Concern  . None   Social History Narrative    Past Psychiatric History: Hospitalizations: Denies  Outpatient Care:Denies  Substance Abuse Care:ETOH  Self-Mutilation: Denies  Suicidal Attempts:Denies  Violent Behaviors:Denies   Risk to Self: Is patient at risk for suicide?: No What has been your use of drugs/alcohol within the last 12 months?: 4 glass of wine daily for the last 20 years Risk to Others:   Prior Inpatient Therapy:   Prior Outpatient Therapy:    Level of Care:  OP  Hospital Course:    Ferron Ishmael was admitted for Major depressive disorder, single episode, severe without psychotic features and crisis management.  He was treated with Norvasc for hypertension, Neurontin for agitation, and Ambien for insomnia.  Medical problems were identified and treated as needed.  Home medications were restarted as appropriate.  Improvement was monitored by observation and [name] daily report of symptom reduction.  Emotional and mental status was monitored by daily self inventory reports completed by [name] and clinical staff.         Majestic Brister was evaluated by the treatment team for stability and plans for continued recovery upon discharge.  He was offered further treatment options upon discharge including Residential, Intensive Outpatient and Outpatient treatment. He/She will follow up with  [facility]) therapy and (Dr. Michae Kava) for medication management.       Journey Ratterman motivation was an integral factor for scheduling further treatment.  Employment, transportation, bed availability, health status, family support, and any pending legal issues were also considered during his hospital stay.  Upon completion of this admission the patient was both mentally and  medically stable for discharge denying suicidal/homicidal ideation, auditory/visual/tactile hallucinations, delusional thoughts and paranoia.       Consults:  psychiatry  Significant Diagnostic Studies:  labs: CBC/Diff, CMET, UDS, ETOH, Urinalysis  Discharge Vitals:   Blood pressure 151/83, pulse 63, temperature 97.5 F (36.4 C), temperature source Oral, resp. rate 17, height  (1.753 m), weight 91.627 kg (202 lb), SpO2 100 %. Body mass index is 29.82 kg/(m^2). Lab Results:   No results found for this or any previous visit (from the past 72 hour(s)).  Physical Findings: AIMS: Facial and Oral Movements Muscles of Facial Expression: None, normal Lips and Perioral Area: None, normal Jaw: None, normal Tongue: None, normal,Extremity Movements Upper (arms, wrists, hands, fingers): None, normal Lower (legs, knees, ankles, toes): None, normal, Trunk Movements Neck, shoulders, hips: None, normal, Overall Severity Severity of abnormal movements (highest score from questions above): None, normal Incapacitation due to abnormal movements: None, normal Patient's awareness of abnormal movements (rate only patient's report): No Awareness, Dental Status Current problems with teeth and/or dentures?: No Does patient usually wear dentures?: No  CIWA:    COWS:  See Psychiatric Specialty Exam and Suicide Risk Assessment completed by Attending Physician prior to discharge.  Discharge destination:  Home  Is patient on multiple antipsychotic therapies at discharge:  No   Has Patient had three or more failed trials of antipsychotic monotherapy by history:  No    Recommended Plan for Multiple Antipsychotic Therapies: NA  Discharge Instructions    Discharge instructions    Complete by:  As directed   Take all of you medications as prescribed by your mental healthcare provider.  Report any adverse effects and reactions from your medications to your outpatient provider promptly. Do not  engage in alcohol and or illegal drug use while on prescription medicines. In the event of worsening symptoms call the crisis hotline, 911, and or go to the nearest emergency department for appropriate evaluation and treatment of symptoms. Follow-up with your primary care provider for your medical issues, concerns and or health care needs.   Keep all scheduled appointments.  If you are unable to keep an appointment call to reschedule.  Let the nurse know if you will need medications before next scheduled appointment.            Medication List    STOP taking these medications        AZOR 5-20 MG per tablet  Generic drug:  amLODipine-olmesartan      TAKE these medications      Indication   amLODipine 5 MG tablet  Commonly known as:  NORVASC  Take 1 tablet (5 mg total) by mouth 2 (two) times daily. For High blood pressure   Indication:  High Blood Pressure     aspirin 81 MG tablet  Take 81 mg by mouth daily.      gabapentin 300 MG capsule  Commonly known as:  NEURONTIN  Take 1 capsule (300 mg total) by mouth 3 (three) times daily. For agitation   Indication:  Agitation, Neuropathic Pain     irbesartan 150 MG tablet  Commonly known as:  AVAPRO  Take 1 tablet (150 mg total) by mouth 2 (two) times daily at 8 am and 10 pm. For high blood pressure   Indication:  High Blood Pressure     phenylephrine-shark liver oil-mineral oil-petrolatum 0.25-3-14-71.9 % rectal ointment  Commonly known as:  PREPARATION H  Place 1 application rectally 2 (two) times daily as needed for hemorrhoids.      zolpidem 5 MG tablet  Commonly known as:  AMBIEN  Take 1 tablet (5 mg total) by mouth at bedtime as needed for sleep.   Indication:  Trouble Sleeping           Follow-up Information    Follow up with [facility]  On 05/31/2014.   Why:  Therapy appointment with Tomma Lightning on Wednesday, Feb. 3rd at 8 am. Please bring new patient paperwork to appointment and call office if you  need to reschedule.   Contact information:   [address] [place] [address] [phone]      Follow up with [facility] On 06/29/2014.   Why:  Medication management appointment with Dr. Michae Kava on Thursday, March 3rd at 9 am. Please arrive at 8:30 am and call office if you need to reschedule.    Contact information:   [address] [place], [place] [address] [phone]      Follow-up recommendations:  Activity:  As tolerated Diet:  As tolerated Other:  Follow up with [facility]  Comments:  Patient has been instructed to take medications as prescribed; and report adverse effects to outpatient provider.  Follow up with primary doctor for any medical issues and If symptoms recur report to nearest emergency or crisis hot line.    Total Discharge Time: Greater than 30 minutes  Signed:  Assunta FoundRankin, Shuvon, FNP-BC 05/24/2014, 10:32 AM  I personally assessed the patient and formulated the plan Madie RenoIrving A. Dub MikesLugo, M.D.

## 2014-05-24 NOTE — Progress Notes (Signed)
  [facility] :  Will you be returning to the same living situation after discharge:  Yes,  patient plans to return home with his family At discharge, do you have transportation home?: Yes,  patient reports that his wife will be picking him up Do you have the ability to pay for your medications: Yes,  patient will be provided with medication samples and prescriptions at discharge.   Release of information consent forms completed and in the chart;  Patient's signature needed at discharge.  Patient to Follow up at: Follow-up Information    [facility]  On 05/[facility]/2014.   Why:  Therapy appointment with Tomma LightningFrankie on Wednesday, Feb. 3rd at 8 am. Please bring new patient paperwork to appointment and call office if you need to reschedule.   Contact information:   [address] [place] [address] [phone]      [facility] On 06/29/2014.   Why:  Medication management appointment with Dr. Michae KavaAgarwal on Thursday, March 3rd at 9 am. Please arrive at 8:30 am and call office if you need to reschedule.    Contact information:   [address] [place] [address] [phone]      Patient denies SI/HI: Yes,  denies    Safety Planning and Suicide Prevention discussed: Yes,  with patient and wife  Has patient been referred to the Quitline?: Patient refused referral  Deshane Cotroneo, West CarboKristin L 05/24/2014, 10:10 AM

## 2014-05-24 NOTE — Progress Notes (Signed)
Patient discharged per physician order; patient denies SI/HI and A/V hallucinations; patient received samples, prescriptions, and copy of AVS after it was reviewed; patient had no other questions or concerns at this time; patient verbalized and signed that he received all belongings; patient left the unit ambulatory 

## 2014-05-24 NOTE — Clinical Social Work Note (Signed)
CSW spoke with patient's wife Barkley BrunsLeslie Christopherson to inform her of patient's discharge home today. She had no concerns about him returning at this time.  Samuella BruinKristin Maxson Oddo, MSW, [employer]CSWA Clinical Social Worker [facility] [phone]

## 2014-05-24 NOTE — [facility] Group Notes (Signed)
   [facility]  05/24/2014  8:45 AM   Participation Quality: Alert, Appropriate and Oriented  Mood/Affect: Appropriate  Depression Rating: 0  Anxiety Rating: 0-1  Thoughts of Suicide: Pt denies SI/HI  Will you contract for safety? Yes  Current AVH: Pt denies  Plan for Discharge/Comments: Pt attended discharge planning group and actively participated in group. CSW provided pt with today's workbook. Patient reports feeling ready for discharge today. Patient reports that he had a good visit with his wife last night and she is planning on picking him up from hospital at discharge. Patient [facility]  Transportation Means: Pt reports access to transportation  Supports: No supports mentioned at this time  Samuella BruinKristin Calley Drenning, MSW, [employer]CSWA Clinical Social Worker [facility] [phone]

## 2014-05-24 NOTE — Plan of Care (Signed)
Problem: Ineffective individual coping Goal: LTG: Patient will report a decrease in negative feelings Outcome: Progressing Pt denies SI/HI/AVH, pt stated he felt good after talking to his wife earlier today Goal: STG-Increase in ability to manage activities of daily living Outcome: Progressing Pt attends to ADL's without assistance

## 2014-05-29 NOTE — Progress Notes (Signed)
Patient Discharge Instructions:  Next Level Care Provider Has Access to the EMR, 05/29/14  Records provided to [facility] via CHL/Epic access.  Jerelene ReddenSheena E Hanna, 05/29/2014, 11:31 AM

## 2014-05-31 ENCOUNTER — Ambulatory Visit (HOSPITAL_COMMUNITY): Payer: Self-pay | Admitting: Clinical

## 2014-06-19 ENCOUNTER — Ambulatory Visit (INDEPENDENT_AMBULATORY_CARE_PROVIDER_SITE_OTHER): Payer: 59 | Admitting: Clinical

## 2014-06-19 ENCOUNTER — Encounter (HOSPITAL_COMMUNITY): Payer: Self-pay | Admitting: Clinical

## 2014-06-19 DIAGNOSIS — F341 Dysthymic disorder: Secondary | ICD-10-CM

## 2014-06-19 DIAGNOSIS — F1010 Alcohol abuse, uncomplicated: Secondary | ICD-10-CM | POA: Diagnosis not present

## 2014-06-19 DIAGNOSIS — R454 Irritability and anger: Secondary | ICD-10-CM
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
## 2014-06-26 ENCOUNTER — Ambulatory Visit (HOSPITAL_COMMUNITY): Payer: Self-pay | Admitting: Clinical

## 2014-06-27 ENCOUNTER — Ambulatory Visit (INDEPENDENT_AMBULATORY_CARE_PROVIDER_SITE_OTHER): Payer: 59 | Admitting: Clinical

## 2014-06-27 ENCOUNTER — Encounter (HOSPITAL_COMMUNITY): Payer: Self-pay | Admitting: Clinical

## 2014-06-27 DIAGNOSIS — F341 Dysthymic disorder: Secondary | ICD-10-CM

## 2014-06-27 DIAGNOSIS — F1010 Alcohol abuse, uncomplicated: Secondary | ICD-10-CM

## 2014-06-27 DIAGNOSIS — R454 Irritability and anger: Secondary | ICD-10-CM

## 2014-06-27 NOTE — Progress Notes (Signed)
THERAPIST PROGRESS NOTE  Session Time: 1:32  Participation Level: Active  Behavioral Response: Casual and NeatAlertAnxious  Type of Therapy: Individual Therapy  Treatment Goals addressed: improve psychiatric symptoms, healthy coping - anger management & grounding/mindfulness  skills  Interventions: CBT, Motivational Interviewing and Other: Grounding and mindfulness techniques  Summary: Jared [name] is a 54 y.o. male who presents with Alcohol use disorder, Dystemia.   Suicidal/Homicidal: No - without intent or plan  Therapist Response: Jared [name] met with clinician for an individual session. Jared [name] shared about his psychiatric symptoms and his current life events. Jared [name] shared that he had been alcohol free with the exception of one night which he limited his intake to 3 beers. He stated that he felt good about this as well as did his spouse. Jared [name] shared that he had dinner with his step daughters and asked them to share about the things that he could change to improve their relationship. They gave him a list which he shared with clinician. Client and clinician discussed Jared [name]'s thoughts and emotions about each of the items on the list.  Clinician asked for clarification on some of them and Jared [name] said he would ask the girls for clarification. Clinician introduced grounding and mindfulness techniques. Clinician explained the purpose and application.  Jared [name] and clinician practiced some techniques together. Jared [name] agreed to practice the techniques daily until next session.  Plan: Return again in 1 week.  Diagnosis: Axis I: Alcohol use disorder, Dystemia    [name] A, LCSW 06/27/2014

## 2014-06-29 ENCOUNTER — Ambulatory Visit (HOSPITAL_COMMUNITY): Payer: Self-pay | Admitting: Psychiatry

## 2014-07-07 ENCOUNTER — Ambulatory Visit (INDEPENDENT_AMBULATORY_CARE_PROVIDER_SITE_OTHER): Payer: 59 | Admitting: Clinical

## 2014-07-07 ENCOUNTER — Encounter (HOSPITAL_COMMUNITY): Payer: Self-pay | Admitting: Clinical

## 2014-07-07 DIAGNOSIS — F1010 Alcohol abuse, uncomplicated: Secondary | ICD-10-CM | POA: Diagnosis not present

## 2014-07-07 DIAGNOSIS — F341 Dysthymic disorder: Secondary | ICD-10-CM

## 2014-07-07 NOTE — Progress Notes (Signed)
THERAPIST PROGRESS NOTE  Session Time: 1:35 -2:30  Participation Level: Active  Behavioral Response: Casual and NeatAlertNA  Type of Therapy: Individual Therapy  Treatment Goals addressed:Improve psychiatric symptoms, interpersonal relationship skills, anger management  Interventions: CBT and Motivational Interviewing  Summary: Jared [name] is a 54 y.o. male who presents with Alcohol use disorder, and Dysthymia .   Suicidal/Homicidal: Nowithout intent/plan  Therapist Response:  Jared [name] met with clinician for an individual session. Jared [name]  shared about his psychiatric symptoms, current life events and his homework. Jared [name] shared that he had been working on using the skills taught in therapy to manage his anger, however he did have a few verbal outburst. Jared [name] shared about an event. Clinician introduced a 7 panel cbt worksheet which client and clinician worked together. Jared [name] identified and rated his emotions.he identified his negative automatic thoughts and client and clinician discussed the evidence for and against the thoughts. Jared [name] was then able to formulate healthier alternative thoughts. Jared [name] and clinician discussed the process and Jared [name]'s insights. Jared [name] and clinician discussed his family and healthier ways to convey his thoughts and emotions to them. Jared [name] agreed to complete some cbt homework before next session.    Plan: Return again in 1-2 weeks.  Diagnosis: Axis I: Alcohol use disorder, and Dysthymia .       Diyan Dave A, LCSW 07/07/2014

## 2014-07-12 ENCOUNTER — Encounter (HOSPITAL_COMMUNITY): Payer: Self-pay | Admitting: Clinical

## 2014-07-12 ENCOUNTER — Ambulatory Visit (INDEPENDENT_AMBULATORY_CARE_PROVIDER_SITE_OTHER): Payer: 59 | Admitting: Clinical

## 2014-07-12 DIAGNOSIS — F341 Dysthymic disorder: Secondary | ICD-10-CM | POA: Diagnosis not present

## 2014-07-12 DIAGNOSIS — F1010 Alcohol abuse, uncomplicated: Secondary | ICD-10-CM | POA: Diagnosis not present

## 2014-07-12 NOTE — Progress Notes (Signed)
   THERAPIST PROGRESS NOTE  Session Time: 12:30 -1:30  Participation Level: Active  Behavioral Response: Casual and NeatAlertNA  Type of Therapy: Individual Therapy  Treatment Goals addressed:  improve psychiatric symptoms, Emotional regulation (anger management), Interpersonal relationship skills,  Harm reduction.  Interventions: CBT and Motivational Interviewing  Summary: Jared [name] is [name] 54 y.o. male who presents with Substance use disorder and Dysthymic disorder.   Suicidal/Homicidal: Nowithout intent/plan  Therapist Response:  Jared [name]  met with clinician for an individual session. Jared [name]  discussed his psychiatric symptoms, his current life events, and his homework. Jared [name] shared that while he had been practicing the skills learned in therapy, he had not completed his homework. 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 Client and clinician discussed his motivation for therapy and Jared [name] had the insight that he would benefit from completing his homework. Jared [name] shared that he had been drinking moderately, that his wife allowed him [name] few drinks on occasion. Client and clinician discussed his thoughts about this. Jared [name] shared that he sleeps better when he doesn't drink at all. He also shared that he can get more irritated when he drinks. Jared [name] shared that he is considering  cutting it back to only special occassions. Jared [name] reported that his anger was low this week. Client and clinician discussed the skills he used. Jared [name] identified some areas for improvement. Jared [name] shared his efforts to be more engaged with his wife. He shared that he ended up having [name] nice time with her. Client and clinician discussed how changing his thoughts affect his emotions. Jared [name] and clinician discussed how he could use this same insight to address some of his other issues at home. [name] agreed to complete some cbt and grounding homework before next  session.  Plan: Return again in 1-2 weeks.  Diagnosis: Axis I: Substance use disorder and Dysthymic disorder     Jared Narang A, LCSW 07/12/2014

## 2014-07-31 ENCOUNTER — Ambulatory Visit (INDEPENDENT_AMBULATORY_CARE_PROVIDER_SITE_OTHER): Payer: 59 | Admitting: Clinical

## 2014-07-31 ENCOUNTER — Encounter (HOSPITAL_COMMUNITY): Payer: Self-pay | Admitting: Clinical

## 2014-07-31 DIAGNOSIS — F341 Dysthymic disorder: Secondary | ICD-10-CM

## 2014-07-31 DIAGNOSIS — F1010 Alcohol abuse, uncomplicated: Secondary | ICD-10-CM | POA: Diagnosis not present

## 2014-07-31 NOTE — Progress Notes (Signed)
   THERAPIST PROGRESS NOTE  Session Time: 11:10 -11:00  Participation Level: Active  Behavioral Response: Casual and NeatAlertNA  Type of Therapy: Individual Therapy  Treatment Goals addressed:  improve psychiatric symptoms, Emotional regulation (anger management), Interpersonal relationship skills, Harm reduction.  Interventions: CBT and Motivational Interviewing  Summary: Jared [name] is a 54 y.o. male who presents with Substance use disorder and Dysthymic disorder.   Suicidal/Homicidal: No - without intent/plan  Therapist Response:  Jared [name] met with clinician for an individual session. Jared [name] discussed his psychiatric symptoms, his current life events, and his homework. Jared [name] shared that he has been feeling somewhat better since being able to earn some money for himself. Jared [name] shared that his wife and him have worked out an agreement which allows him to drink an agreed upon amount of alcohol ( on agreed upon days) but not more. Jared [name] stated that he felt the system was working well for everybody and his wife agreed. Jared [name] shared about his families trip to [place]. He shared about the discussions and interactions with his wife and his daughters. He shared that he felt like as he practices his more open approach that the others are responding better which is helping his overall mood. Jared [name] shared that he was unable to complete his packet this past week but will before next session. Jared [name] shared that he would like to get rid of the risidual anger he has towards his brother. Clinician asked Jared [name] open ended questions about his anger, its purpose, what is the benefit of keeping it what is the benefit of letting it go. Jared [name] and clinician agreed to address the topic further at a future session.  Plan: Return again in 1-2 weeks.  Diagnosis:     Axis I: Substance use disorder and Dysthymic disorder    Krisa Blattner A, LCSW 07/31/2014

## 2014-08-17 ENCOUNTER — Ambulatory Visit (INDEPENDENT_AMBULATORY_CARE_PROVIDER_SITE_OTHER): Payer: 59 | Admitting: Clinical

## 2014-08-17 ENCOUNTER — Encounter (HOSPITAL_COMMUNITY): Payer: Self-pay | Admitting: Clinical

## 2014-08-17 DIAGNOSIS — F341 Dysthymic disorder: Secondary | ICD-10-CM | POA: Diagnosis not present

## 2014-08-17 DIAGNOSIS — R454 Irritability and anger: Secondary | ICD-10-CM | POA: Diagnosis not present

## 2014-08-17 DIAGNOSIS — F1010 Alcohol abuse, uncomplicated: Secondary | ICD-10-CM | POA: Diagnosis not present

## 2014-08-17 NOTE — Progress Notes (Signed)
   THERAPIST PROGRESS NOTE  Session Time: 12:04-1:02  Participation Level: Active  Behavioral Response: Casual and NeatAlertNA  Type of Therapy: Individual Therapy  Treatment Goals addressed:  improve psychiatric symptoms, Emotional regulation (anger management), Interpersonal relationship skills, Harm reduction.  Interventions: CBT and Motivational Interviewing  Summary: Jared [name] is a 54 y.o. male who presents with Substance use disorder and Dysthymic disorder.   Suicidal/Homicidal: No - without intent/plan  Therapist Response:  Saralyn Pilar met with clinician for an individual session. [name] discussed his psychiatric symptoms, his current life events, and his homework. [name] shared that he did not complete all of his homework. Client and clinician reviewed what he was to do and client agreed to complete the homework before next session. [name] shared that he had been doing fairly well with harm reduction. He did discuss a fuss that happened. Client and clinician used cbt and [name] was able to identify some faulty thinking on his part. [name] discussed his daughters and their views on the changes in the house. Client and clinician discussed questions he could ask them and how he could hear them more effectively (they are teenages). Saralyn Pilar and clinician discussed grudges, why we keep them and how to release them.   Plan: Return again in 2 weeks.  Diagnosis:     Axis I: Substance use disorder and Dysthymic disorder    [name] A, LCSW 08/17/2014

## 2014-09-06 ENCOUNTER — Ambulatory Visit (HOSPITAL_COMMUNITY): Payer: Self-pay | Admitting: Clinical

## 2014-09-20 ENCOUNTER — Ambulatory Visit (INDEPENDENT_AMBULATORY_CARE_PROVIDER_SITE_OTHER): Payer: 59 | Admitting: Clinical

## 2014-09-20 DIAGNOSIS — R454 Irritability and anger: Secondary | ICD-10-CM | POA: Diagnosis not present

## 2014-09-20 DIAGNOSIS — F1010 Alcohol abuse, uncomplicated: Secondary | ICD-10-CM

## 2014-09-20 DIAGNOSIS — F341 Dysthymic disorder: Secondary | ICD-10-CM | POA: Diagnosis not present

## 2014-09-21 ENCOUNTER — Encounter (HOSPITAL_COMMUNITY): Payer: Self-pay | Admitting: Clinical

## 2014-09-21 NOTE — Progress Notes (Signed)
   THERAPIST PROGRESS NOTE  Session Time: 11:04 -12:03  Participation Level: Active  Behavioral Response: CasualAlertNA  Type of Therapy: Individual Therapy  Treatment Goals addressed:  improve psychiatric symptoms, Emotional regulation (anger management), Interpersonal relationship skills, Harm reduction.  Interventions: CBT and Motivational Interviewing  Summary: Jared [name] is a 54 y.o. male who presents with Substance use disorder and Dysthymic disorder.   Suicidal/Homicidal: No - without intent/plan  Therapist Response:  Jared [name] met with clinician for an individual session. Jared [name] discussed his psychiatric symptoms, his current life events, and his homework. [name] reports that he has been limiting his alcohol intake and that he has recognized past patterns which led to problems.  Jared [name] and clinician discussed the adjustments he has made and his families positive reactions. [name] discussed an argument he had with his oldest daughter. He shared about the events leading to the argument. He shared his and her reactions. Client and clinician discussed which parts of the argument he felt went well and which parts he would like to improve. Jared [name] and clinician discussed the unconscious emotions that played into the argument. Jared [name] and clinician discussed how he could address these emotions and adjust his argument style to be working towards a win win situation. [name] agreed to think about his desired outcomes vs winning an argument and to try and report back on the techniques tried.   Plan: Return again in 3-4 weeks.  Diagnosis:     Axis I: Substance use disorder and Dysthymic disorder    Bernarda Erck A, LCSW 09/21/2014

## 2014-10-04 ENCOUNTER — Ambulatory Visit (INDEPENDENT_AMBULATORY_CARE_PROVIDER_SITE_OTHER): Payer: 59 | Admitting: Clinical

## 2014-10-04 ENCOUNTER — Encounter (HOSPITAL_COMMUNITY): Payer: Self-pay | Admitting: Clinical

## 2014-10-04 DIAGNOSIS — R454 Irritability and anger: Secondary | ICD-10-CM

## 2014-10-04 DIAGNOSIS — F341 Dysthymic disorder: Secondary | ICD-10-CM

## 2014-10-04 DIAGNOSIS — F1010 Alcohol abuse, uncomplicated: Secondary | ICD-10-CM | POA: Diagnosis not present

## 2014-10-04 NOTE — Progress Notes (Signed)
   THERAPIST PROGRESS NOTE  Session Time: 11:00 -11:56  Participation Level: Active  Behavioral Response: CasualAlertAngry  Type of Therapy: Individual Therapy  Treatment Goals addressed:  improve psychiatric symptoms, Emotional regulation (anger management), Interpersonal relationship skills, Harm reduction.  Interventions: CBT and Motivational Interviewing  Summary: Jared [name] is a 54 y.o. male who presents with Substance use disorder and Dysthymic disorder.   Suicidal/Homicidal: No - without intent/plan  Therapist Response:  Saralyn Pilar met with clinician for an individual session. [name] discussed his psychiatric symptoms, his current life events, and his homework. Saralyn Pilar and clinician reviewed and discussed his homework. [name] shared that he felt he was doing good with controlling his alcohol intake. He stated that his daughters prefer he didn't drink at all. He shared that his behavior has improved and that he thinks they are still viewing him through old behavior. [name] shared that he had a verbal argument with his daughter. [name] shared about his actions and her actions. [name] shared his negative automatic thoughts. Client and clinician discussed the goal of the argument. Clinician asked open ended questions and [name] had the insight that his behaviors and words were not in alignment with his desired out come ( to have help). Client and clinician discussed ways he could 1 keep in mind his desired outcome and convey it in a way it could be received. Client and clinician discussed whether or not his approach was related to drinking.[name] stated that he thought his depression/anger  was fuelled by work problems. He stated he did not want to have his emotions control by wheter or not the stocks were up or down. He shared that his family is of the utmost importance to him. Saralyn Pilar and clinician agreed to continue to work on his emotions regulation skills in the future. [name]  agreed to continue his homework until next session.  Plan: Return again in 3-4 weeks.  Diagnosis:     Axis I: Substance use disorder and Dysthymic disorder    Devetta Hagenow A, LCSW 10/04/2014

## 2014-11-01 ENCOUNTER — Encounter (HOSPITAL_COMMUNITY): Payer: Self-pay | Admitting: Clinical

## 2014-11-01 ENCOUNTER — Ambulatory Visit (INDEPENDENT_AMBULATORY_CARE_PROVIDER_SITE_OTHER): Payer: 59 | Admitting: Clinical

## 2014-11-01 DIAGNOSIS — F1010 Alcohol abuse, uncomplicated: Secondary | ICD-10-CM | POA: Diagnosis not present

## 2014-11-01 DIAGNOSIS — F341 Dysthymic disorder: Secondary | ICD-10-CM

## 2014-11-01 NOTE — Progress Notes (Signed)
   THERAPIST PROGRESS NOTE  Session Time: 12:03 - 1:01  Participation Level: Active  Behavioral Response: CasualAlertNA  Type of Therapy: Individual Therapy  Treatment Goals addressed:  improve psychiatric symptoms, Emotional regulation (anger management), Interpersonal relationship skills, Harm reduction.  Interventions: CBT and Motivational Interviewing  Summary: Ryo Klang is a 54 y.o. male who presents with Substance use disorder and Dysthymic disorder.   Suicidal/Homicidal: No - without intent/plan  Therapist Response:  Saralyn Pilar met with clinician for an individual session. Niyam discussed his psychiatric symptoms, his current life events, and his homework Desmund shared that he was doing okay with harm reduction. He shared  that he has been limiting his drinking and that his family has seen a difference in his behavior and attitude. Sircharles shared that he has had less arguments with his family since the last time we met. He shared that he thinks it is a combo of the changes he has made and that as he continues to make changes his family is beginning to trust his efforts. Noble shared that he still has work to do in both areas but he is making improvements. Saralyn Pilar and clinician discussed his efforts and his thoughts and insights. Saralyn Pilar and clinician discussed ways he could continue to improve his relationships. Mardy shared his insight that the better he gets, the better they respond, the happier he is. Devean also shared his thoughts and insights about the events that had been bothering him before client and clinician began meeting. Solace shared that he still sometimes ruminates about the past and feels angry. Client and clinician discussed his beliefs about this and ways to stop the ruminations. Alante continues to use his grounding techniques. Saralyn Pilar and clinician agreed to discuss making a plan to graduate him from therapy at next session  Plan: Return again in 3-4  weeks.  Diagnosis:     Axis I: Substance use disorder and Dysthymic disorder    Miranda Garber A, LCSW 11/01/2014

## 2014-11-15 ENCOUNTER — Ambulatory Visit (INDEPENDENT_AMBULATORY_CARE_PROVIDER_SITE_OTHER): Payer: 59 | Admitting: Clinical

## 2014-11-15 DIAGNOSIS — F1010 Alcohol abuse, uncomplicated: Secondary | ICD-10-CM

## 2014-11-15 DIAGNOSIS — R454 Irritability and anger: Secondary | ICD-10-CM

## 2014-11-15 DIAGNOSIS — F341 Dysthymic disorder: Secondary | ICD-10-CM | POA: Diagnosis not present

## 2014-11-16 NOTE — Progress Notes (Signed)
   THERAPIST PROGRESS NOTE  Session Time: 12:01 -12:58  Participation Level: Active  Behavioral Response: CasualAlertAnxious  Type of Therapy: Individual Therapy  Treatment Goals addressed:  improve psychiatric symptoms, Emotional regulation (anger management),  Harm reduction.  Interventions: CBT and Motivational Interviewing  Summary: Jared [name] is a 54 y.o. male who presents with Substance use disorder and Dysthymic disorder.   Suicidal/Homicidal: No - without intent/plan  Therapist Response:  Saralyn Pilar met with clinician for an individual session. Jared [name] discussed his psychiatric symptoms, his current life events, and his homework. Jared [name] shared that he had been doing better with his depression, and anger. He shared that he was also doing good with his harm reduction. Client and clinician agreed to work towards graduating him out of therapy in 3-4 session. Jared [name] shared that when he shared with his wife that he may be graduating soon she was worried that things would go back to the way they were before therapy. Client and clinician agreed to use the remaining sessions to strengthen his skills and to address for seeable issues. Saralyn Pilar and clinician discussed the fact that if needed therapy could be available again.   Saralyn Pilar and clinician discussed the incidents that had fueled his anger in the past. Jared [name] shared about the issues with his brother and other family members. 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 He recognized that he had done this for his parents and that he also reaped the stresses benefits that came with this such as knowing his parents on a deeper level and being able to walk away knowing he had done right by them. Client and clinician discussed the cost of holding on to anger (his brother did not help) and the lack of benefit. Jared [name] had the insight that the anger helped him from  having to feel hurt and sadness. Client and clinician discussed how he could process those emotions so he would not need the anger.  Jared [name] shared that everyone (his family) has been happier as he has made the changes he has. Jared [name] agreed to continue his homework until next session.  Plan: Return again in 3-4 weeks.  Diagnosis:     Axis I: Substance use disorder and Dysthymic disorder    Jared [name] A, LCSW 11/16/2014

## 2014-11-17 ENCOUNTER — Encounter (HOSPITAL_COMMUNITY): Payer: Self-pay | Admitting: Clinical

## 2014-11-29 ENCOUNTER — Ambulatory Visit (HOSPITAL_COMMUNITY): Payer: Self-pay | Admitting: Clinical

## 2014-12-05 ENCOUNTER — Ambulatory Visit (HOSPITAL_COMMUNITY): Payer: Self-pay | Admitting: Clinical

## 2014-12-27 ENCOUNTER — Ambulatory Visit (INDEPENDENT_AMBULATORY_CARE_PROVIDER_SITE_OTHER): Payer: 59 | Admitting: Clinical

## 2014-12-27 ENCOUNTER — Encounter (HOSPITAL_COMMUNITY): Payer: Self-pay | Admitting: Clinical

## 2014-12-27 DIAGNOSIS — R454 Irritability and anger: Secondary | ICD-10-CM | POA: Diagnosis not present

## 2014-12-27 DIAGNOSIS — F341 Dysthymic disorder: Secondary | ICD-10-CM

## 2014-12-27 DIAGNOSIS — F1010 Alcohol abuse, uncomplicated: Secondary | ICD-10-CM | POA: Diagnosis not present

## 2014-12-27 NOTE — Progress Notes (Signed)
   THERAPIST PROGRESS NOTE  Session Time: 4:33 - 5:30  Participation Level: Active  Behavioral Response: NeatAlertNA  Type of Therapy: Individual Therapy  Treatment Goals addressed:  improve psychiatric symptoms, Emotional regulation (anger management), Interpersonal relationship skills, Harm reduction.  Interventions: CBT and Motivational Interviewing  Summary: Jared [name] is a 54 y.o. male who presents with Substance use disorder and Dysthymic disorder.   Suicidal/Homicidal: No - without intent/plan  Therapist Response:  Saralyn Pilar met with clinician for an individual session. Jared [name] discussed his psychiatric symptoms, his current life events, and his homework. Jared [name] shared that he has continued to limit his alcohol intake and believes that this method is working well for him. He shared that the arguments have gone down and he has also been happier. He shared that since cutting back  on his drinking he is less likely to pick arguments. Client and clinician discussed harm reduction skills and the importance of maintaining his boundaries with himself. Jared [name] shared about some of the ways his life has improved since reducing his intake. Client and clinician discussed how his family views the change and areas they saw for improvement. Jared [name] shared about working to be open to hear their thoughts and opinions. He shared that the more he practices his anger management skills, the less things bother him. Client and clinician discussed his anger management skills. Jared [name] shared that he is addressing things better, so that his wife and children are less likely to react negatively. Jared [name] also shared he was using the skill (discussed at an earlier session) of keeping in mind his end goal when dealing with issues. Saralyn Pilar and clinician discussed ways he could continue to improve his anger management   Plan: Return again in 3-4 weeks.  Diagnosis:     Axis I: Substance use disorder and Dysthymic  disorder    Elli Groesbeck A, LCSW 12/27/2014

## 2015-01-15 ENCOUNTER — Ambulatory Visit (INDEPENDENT_AMBULATORY_CARE_PROVIDER_SITE_OTHER): Payer: 59 | Admitting: Clinical

## 2015-01-15 DIAGNOSIS — R454 Irritability and anger: Secondary | ICD-10-CM | POA: Diagnosis not present

## 2015-01-15 DIAGNOSIS — F341 Dysthymic disorder: Secondary | ICD-10-CM | POA: Diagnosis not present

## 2015-01-15 DIAGNOSIS — F1010 Alcohol abuse, uncomplicated: Secondary | ICD-10-CM | POA: Diagnosis not present

## 2015-01-22 ENCOUNTER — Encounter (HOSPITAL_COMMUNITY): Payer: Self-pay | Admitting: Clinical

## 2015-01-22 NOTE — Progress Notes (Signed)
   THERAPIST PROGRESS NOTE  Session Time: 4:34 -:5:30   Participation Level: Active  Behavioral Response: CasualAlertNA  Type of Therapy: Individual Therapy  Treatment Goals addressed:  improve psychiatric symptoms, Emotional regulation (anger management), Interpersonal relationship skills, Harm reduction.  Interventions: CBT and Motivational Interviewing  Summary: Jared [name] is a 54 y.o. male who presents with Alcohol use disorder and Dysthymic disorder.   Suicidal/Homicidal: No - without intent/plan  Therapist Response:  Jared [name] met with clinician for an individual session. Jared [name] discussed his psychiatric symptoms, his current life events, and his homework. Jared [name] discussed his alcohol intake. Clinician used open ended questions.  Jared [name] shared that while he has continued to drink  He also continues to limit his alcohol intake. He shared that he is thinking about losing some weight. Clinician informed Jared [name] that alcohol slows the metabolism. Jared [name] shared that he might consider that good reason to cut back on his alcohol take even more. He shared that he has experienced a lot of benefit from cutting back and might benefit from more. He stated that he understands now (where he didn't before) how he was suppressing his emotions, especially built up resentment towards his brother with alcohol. Jared [name] and clinician discussed the connection between his use of alcohol and his feelings of anger. Jared [name] shared that he worked on his Child psychotherapist. Jared [name] shared that the more he works on the the calmer he is and less irritated by the little stuff. He shared about some incidents since last session and how he handled the situations better than he had prior to coming to therapy. Jared [name] said that his past interactions with his brother lead him to believe that most people were "bad". Client and clinician discussed this perception. Client and clinician discussed how his behaviors  towards others have been shaped by this perception. Client and clinician discussed the evidence that did and did not support this belief. Jared [name] was able to formulate healthier alternative thoughts. Jared [name] shared how his perception of others might change if he believed the healthier alternative thoughts. Jared [name] and clinician discussed how this exercise would help him in his anger management and personal relationships. Jared [name] agreed to continue practicing his skills until next session.   Plan: Return again in 3-4 weeks.  Diagnosis:     Axis I: Alcohol use disorder and Dysthymic disorder    Powell,Frances A, LCSW 01/22/2015

## 2015-02-08 ENCOUNTER — Ambulatory Visit (HOSPITAL_COMMUNITY): Payer: Self-pay | Admitting: Clinical

## 2015-03-05 ENCOUNTER — Ambulatory Visit (INDEPENDENT_AMBULATORY_CARE_PROVIDER_SITE_OTHER): Payer: 59 | Admitting: Clinical

## 2015-03-05 DIAGNOSIS — F1010 Alcohol abuse, uncomplicated: Secondary | ICD-10-CM

## 2015-03-05 DIAGNOSIS — F341 Dysthymic disorder: Secondary | ICD-10-CM

## 2015-03-05 DIAGNOSIS — R454 Irritability and anger: Secondary | ICD-10-CM | POA: Diagnosis not present

## 2015-03-07 ENCOUNTER — Encounter (HOSPITAL_COMMUNITY): Payer: Self-pay | Admitting: Clinical

## 2015-03-07 NOTE — Progress Notes (Signed)
   THERAPIST PROGRESS NOTE  Session Time: 4:32 - 5:30  Participation Level: Active  Behavioral Response: CasualAlertIrritable  Type of Therapy: Individual Therapy  Treatment Goals addressed:  improve psychiatric symptoms, Emotional regulation (anger management), Interpersonal relationship skills, Harm reduction.  Interventions: CBT and Motivational Interviewing  Summary: Jared [name] is a 54 y.o. male who presents with Alcohol use disorder and Dysthymic disorder.   Suicidal/Homicidal: No - without intent/plan  Therapist Response:  Saralyn Pilar met with clinician for an individual session. [name] discussed his psychiatric symptoms, and  his current life events. [name] shared that he has been agitated and irritable lately. He shared that he has had a short fuse though he denies having any big outburst. [name] explored the cause of irritation. Client and clinician discussed whether or not his alcohol intake could be contributing to his current mood.He shared that his wife had noted that he was drinking more, he stated that when she pointed it out (2 weeks ago)  he made a conscious effort to cut back. He shared that he was able to do so and does not feel bother by it at all. He stated he would like to workout but his wife's stuff is in the way. Saralyn Pilar and clinician discussed his interactions with his wife about her objects, how he thinks about it, and his emotions. [name] had the insight that she is often more eager to do something if they do it as a couple. 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 When asked to explain [name] shared that he had been very concerned the election and how it would affect his livelihood. [name] shared his thoughts and concerns. Saralyn Pilar and clinician discussed what kinds of things were in his power to change and what things were not. He shared that he could  Approach his wife differently, move or help move his wife's stuff so that he could  work out, he could continue to manage his alcohol intake, he stated that he cast his vote but cannot control the outcome, but he could choose how much money to invest, when, and with who.  [name] had the insight that doing those things would make him feel less agitated and angry.     Plan: Return again in 3-4 weeks.  Diagnosis:     Axis I: Alcohol use disorder and Dysthymic disorder    Geni Skorupski A, LCSW 03/07/2015

## 2015-03-26 ENCOUNTER — Ambulatory Visit (INDEPENDENT_AMBULATORY_CARE_PROVIDER_SITE_OTHER): Payer: 59 | Admitting: Clinical

## 2015-03-26 ENCOUNTER — Encounter (HOSPITAL_COMMUNITY): Payer: Self-pay | Admitting: Clinical

## 2015-03-26 DIAGNOSIS — F1010 Alcohol abuse, uncomplicated: Secondary | ICD-10-CM

## 2015-03-26 DIAGNOSIS — R454 Irritability and anger: Secondary | ICD-10-CM | POA: Diagnosis not present

## 2015-03-26 DIAGNOSIS — F341 Dysthymic disorder: Secondary | ICD-10-CM | POA: Diagnosis not present

## 2015-03-26 NOTE — Progress Notes (Signed)
   THERAPIST PROGRESS NOTE  Session Time: 4:35 - 5:30  Participation Level: Active  Behavioral Response: CasualAlertNA  Type of Therapy: Individual Therapy  Treatment Goals addressed:  improve psychiatric symptoms, Emotional regulation (anger management), Interpersonal relationship skills, Harm reduction.  Interventions: CBT and Motivational Interviewing  Summary: Jared [name] is a 54 y.o. male who presents with Alcohol use disorder and Dysthymic disorder.   Suicidal/Homicidal: No - without intent/plan  Therapist Response:  Jared [name] met with clinician for an individual session. Jared [name] discussed his psychiatric symptoms and  his current life events. Jared [name] shared that he continues to use the harm reduction techniques to limit his drinking. He shared that there has been 1-2 days that he drank more than desired but that he and his family have been happy with the changes he has made.Client and clinician discussed signs of things going right as well as red flags for things going the wrong direction. He shared about his experience with anger. He noted that his angry outburst have become less intense and farther between incidents. Client and clinician discussed his thoughts and insights about the changes taking place. Jared [name] shared that he has been using his therapy tools to keep himself from focusing on his brother. He shared that he has been focusing instead on his family. Client and clinician discussed his family and some of the interactions he would like to improve. Jared [name] shared his view of the interactions. Client and clinician discussed alternative views of the interactions. Jared [name] had the insight that he wants to be treated like family. He loves his wife and step daughters. He recognized that his teenage daughters behaviors come from a place of being safe enough with him that they treat him as typical teenagers treat their father. He had the insight that if he saw it this way their  behaviors might be problematic but he could address them more effectively.   Plan: Return again in 3-4 weeks.  Diagnosis:     Axis I: Alcohol use disorder and Dysthymic disorder    Fedora Knisely A, LCSW 03/26/2015

## 2015-04-18 ENCOUNTER — Ambulatory Visit (HOSPITAL_COMMUNITY): Payer: Self-pay | Admitting: Clinical

## 2015-09-10 ENCOUNTER — Encounter: Payer: Self-pay | Admitting: Internal Medicine

## 2016-01-24 ENCOUNTER — Encounter (HOSPITAL_COMMUNITY): Payer: Self-pay

## 2016-01-24 ENCOUNTER — Emergency Department (HOSPITAL_COMMUNITY)
Admission: EM | Admit: 2016-01-24 | Discharge: 2016-01-24 | Disposition: A | Discharge status: Other Acute Inpatient Hospital | Payer: BC Managed Care – PPO | Attending: Emergency Medicine | Admitting: Emergency Medicine

## 2016-01-24 DIAGNOSIS — Z79899 Other long term (current) drug therapy: Secondary | ICD-10-CM | POA: Diagnosis not present

## 2016-01-24 DIAGNOSIS — F6381 Intermittent explosive disorder: Secondary | ICD-10-CM | POA: Diagnosis present

## 2016-01-24 DIAGNOSIS — F329 Major depressive disorder, single episode, unspecified: Secondary | ICD-10-CM | POA: Insufficient documentation

## 2016-01-24 DIAGNOSIS — Z7982 Long term (current) use of aspirin: Secondary | ICD-10-CM | POA: Insufficient documentation

## 2016-01-24 DIAGNOSIS — F322 Major depressive disorder, single episode, severe without psychotic features: Secondary | ICD-10-CM | POA: Diagnosis present

## 2016-01-24 DIAGNOSIS — I10 Essential (primary) hypertension: Secondary | ICD-10-CM | POA: Diagnosis not present

## 2016-01-24 DIAGNOSIS — Z87891 Personal history of nicotine dependence: Secondary | ICD-10-CM | POA: Insufficient documentation

## 2016-01-24 DIAGNOSIS — R45851 Suicidal ideations: Secondary | ICD-10-CM | POA: Diagnosis present

## 2016-01-24 DIAGNOSIS — R45850 Homicidal ideations: Secondary | ICD-10-CM

## 2016-01-24 LAB — CBC
HCT: 41.2 % (ref 39.0–52.0)
HEMOGLOBIN: 14.9 g/dL (ref 13.0–17.0)
MCH: 32.2 pg (ref 26.0–34.0)
MCHC: 36.2 g/dL — ABNORMAL HIGH (ref 30.0–36.0)
MCV: 89 fL (ref 78.0–100.0)
Platelets: 233 10*3/uL (ref 150–400)
RBC: 4.63 MIL/uL (ref 4.22–5.81)
RDW: 12.7 % (ref 11.5–15.5)
WBC: 8.4 10*3/uL (ref 4.0–10.5)

## 2016-01-24 LAB — RAPID URINE DRUG SCREEN, HOSP PERFORMED
AMPHETAMINES: NOT DETECTED
BARBITURATES: NOT DETECTED
BENZODIAZEPINES: NOT DETECTED
COCAINE: NOT DETECTED
Opiates: NOT DETECTED
Tetrahydrocannabinol: NOT DETECTED

## 2016-01-24 LAB — COMPREHENSIVE METABOLIC PANEL
ALBUMIN: 4.9 g/dL (ref 3.5–5.0)
ALT: 50 U/L (ref 17–63)
ANION GAP: 8 (ref 5–15)
AST: 36 U/L (ref 15–41)
Alkaline Phosphatase: 74 U/L (ref 38–126)
BILIRUBIN TOTAL: 0.9 mg/dL (ref 0.3–1.2)
BUN: 14 mg/dL (ref 6–20)
CHLORIDE: 108 mmol/L (ref 101–111)
CO2: 25 mmol/L (ref 22–32)
Calcium: 9.6 mg/dL (ref 8.9–10.3)
Creatinine, Ser: 0.9 mg/dL (ref 0.61–1.24)
GFR calc Af Amer: >60 mL/min (ref >60)
GFR calc non Af Amer: >60 mL/min (ref >60)
GLUCOSE: 131 mg/dL — AB (ref 65–99)
POTASSIUM: 3.9 mmol/L (ref 3.5–5.1)
SODIUM: 141 mmol/L (ref 135–145)
Total Protein: 7.7 g/dL (ref 6.5–8.1)

## 2016-01-24 LAB — ACETAMINOPHEN LEVEL

## 2016-01-24 LAB — SALICYLATE LEVEL: Salicylate Lvl: <4 mg/dL (ref 2.8–30.0)

## 2016-01-24 LAB — ETHANOL: Alcohol, Ethyl (B): <5 mg/dL (ref <5)

## 2016-01-24 MED ORDER — TRAZODONE HCL 50 MG PO TABS: 50.0000 mg | ORAL_TABLET | Freq: Every evening | ORAL | Status: DC | PRN

## 2016-01-24 MED ORDER — IRBESARTAN 150 MG PO TABS
150.0000 mg | ORAL_TABLET | Freq: Every day | ORAL | Status: DC
  Filled 2016-01-24: qty 1

## 2016-01-24 MED ORDER — ONDANSETRON HCL 4 MG PO TABS: 4.0000 mg | ORAL_TABLET | Freq: Three times a day (TID) | ORAL | Status: DC | PRN

## 2016-01-24 MED ORDER — GABAPENTIN 300 MG PO CAPS
300.0000 mg | ORAL_CAPSULE | Freq: Three times a day (TID) | ORAL | Status: DC
  Filled 2016-01-24: qty 1

## 2016-01-24 MED ORDER — OXCARBAZEPINE 300 MG PO TABS
300.0000 mg | ORAL_TABLET | Freq: Two times a day (BID) | ORAL | Status: DC
  Administered 2016-01-24 (×2): 300 mg via ORAL
  Filled 2016-01-24 (×2): qty 1

## 2016-01-24 MED ORDER — SERTRALINE HCL 50 MG PO TABS: 25.0000 mg | ORAL_TABLET | Freq: Every day | ORAL | Status: DC

## 2016-01-24 MED ORDER — NICOTINE 21 MG/24HR TD PT24
21.0000 mg | MEDICATED_PATCH | Freq: Every day | TRANSDERMAL | Status: DC
  Filled 2016-01-24: qty 1

## 2016-01-24 MED ORDER — IRBESARTAN 150 MG PO TABS
150.0000 mg | ORAL_TABLET | Freq: Two times a day (BID) | ORAL | Status: DC
  Administered 2016-01-24 (×2): 150 mg via ORAL
  Filled 2016-01-24 (×2): qty 1

## 2016-01-24 MED ORDER — AMLODIPINE BESYLATE 5 MG PO TABS
5.0000 mg | ORAL_TABLET | Freq: Every day | ORAL | Status: DC
  Filled 2016-01-24: qty 1

## 2016-01-24 MED ORDER — SERTRALINE HCL 50 MG PO TABS
50.0000 mg | ORAL_TABLET | Freq: Every day | ORAL | Status: DC
  Filled 2016-01-24: qty 1

## 2016-01-24 MED ORDER — LORAZEPAM 1 MG PO TABS
1.0000 mg | ORAL_TABLET | Freq: Three times a day (TID) | ORAL | Status: DC | PRN
  Administered 2016-01-24 (×2): 1 mg via ORAL
  Filled 2016-01-24 (×2): qty 1

## 2016-01-24 MED ORDER — AMLODIPINE BESYLATE 5 MG PO TABS
5.0000 mg | ORAL_TABLET | Freq: Two times a day (BID) | ORAL | Status: DC
  Administered 2016-01-24 (×2): 5 mg via ORAL
  Filled 2016-01-24: qty 1

## 2016-01-24 MED ORDER — AMLODIPINE-OLMESARTAN 5-20 MG PO TABS: 1.0000 | ORAL_TABLET | Freq: Two times a day (BID) | ORAL | Status: DC

## 2016-01-24 MED ORDER — ALUM & MAG HYDROXIDE-SIMETH 200-200-20 MG/5ML PO SUSP: 30.0000 mL | ORAL | Status: DC | PRN

## 2016-01-24 MED ORDER — ACETAMINOPHEN 325 MG PO TABS: 650.0000 mg | ORAL_TABLET | ORAL | Status: DC | PRN

## 2016-01-24 MED ORDER — IBUPROFEN 200 MG PO TABS: 600.0000 mg | ORAL_TABLET | Freq: Three times a day (TID) | ORAL | Status: DC | PRN

## 2016-01-24 MED ORDER — ASPIRIN EC 81 MG PO TBEC
162.0000 mg | DELAYED_RELEASE_TABLET | Freq: Every day | ORAL | Status: DC
  Administered 2016-01-24: 162 mg via ORAL
  Filled 2016-01-24: qty 2

## 2016-01-24 MED ORDER — ZOLPIDEM TARTRATE 5 MG PO TABS: 5.0000 mg | ORAL_TABLET | Freq: Every evening | ORAL | Status: DC | PRN

## 2016-01-24 NOTE — Progress Notes (Signed)
Pt. Remains calm and cooperative.  He denies any pain at present time.  Pt. Is aware that [facility]  Pt. Remains safe on the unit.

## 2016-01-24 NOTE — BH Assessment (Signed)
Assessment Note  Jared [name] is an 55 y.o. male.   Diagnosis:   Past Medical History:  Past Medical History:  Diagnosis Date  . Heart murmur   . Hypertension     History reviewed. No pertinent surgical history.  Family History:  Family History  Problem Relation Age of Onset  . Heart disease Mother   . Hypertension Mother   . Stroke Mother   . Heart disease Father     Social History:  reports that he has quit smoking. He has never used smokeless tobacco. He reports that he drinks about 10.8 oz of alcohol per week . He reports that he does not use drugs.  Additional Social History:  Alcohol / Drug Use Pain Medications: No abuse reported. Prescriptions: No abuse reported Over the Counter: No abuse reported Longest period of sobriety (when/how long): Pt is currently 6 mths sober. Withdrawal Symptoms: Seizures Onset of Seizures: Not Reported Date of most recent seizure: 61yrs ago Substance #1 Name of Substance 1: Alcohol 1 - Age of First Use: 16 1 - Amount (size/oz): 1-2 bottles of red wine (heaviest use) 1 - Frequency: daily 1 - Duration: 5-10 years 1 - Last Use / Amount: 6 months ago  CIWA: CIWA-Ar BP: 134/88 Pulse Rate: 80 COWS:    Allergies: No Known Allergies  Home Medications:  (Not in a hospital admission)  OB/GYN Status:  No LMP for male patient.  General Assessment Data Location of Assessment: [facility] Assessment Services TTS Assessment: In system Is this a Tele or Face-to-Face Assessment?: Face-to-Face Is this an Initial Assessment or a Re-assessment for this encounter?: Initial Assessment Marital status: Married Is patient pregnant?: No Pregnancy Status: No Living Arrangements: Spouse/significant other Can pt return to current living arrangement?: Yes Admission Status: Involuntary Is patient capable of signing voluntary admission?: No Referral Source: Self/Family/Friend Insurance type: Self-Pay     Crisis Care Plan Living Arrangements:  Spouse/significant other Name of Psychiatrist: None Name of Therapist: None  Education Status Is patient currently in school?: No 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 : No Is patient at risk for suicide?: No Suicidal Plan?: No Has patient had any suicidal plan within the past 6 months prior to admission? : No Access to Means: No What has been your use of drugs/alcohol within the last 12 months?: Pt reports he is an alcoholic and is in [place]. Pt has been sober "almost six months". Previous Attempts/Gestures: Yes (Pt initially denied previous gesture) How many times?: 1 Other Self Harm Risks: poor impulse control, h/o attempt, impaired judgement Triggers for Past Attempts: Other (Comment) (anger/conflict with wife) Intentional Self Injurious Behavior: None Recent stressful life event(s): Conflict (Comment) (conflict w/ wife & step-daughter) Persecutory voices/beliefs?: No Depression: No Depression Symptoms: Feeling angry/irritable Substance abuse history and/or treatment for substance abuse?: Yes Suicide prevention information given to non-admitted patients: Yes  Risk to Others within the past 6 months Homicidal Ideation: No Does patient have any lifetime risk of violence toward others beyond the six months prior to admission? : No Thoughts of Harm to Others: No (Pt denies) Current Homicidal Intent: No Current Homicidal Plan: No Access to Homicidal Means: No History of harm to others?: No (Pt denies) Assessment of Violence: None Noted Does patient have access to weapons?: Yes (Comment) (access to "plenty  of" firearms) Criminal Charges Pending?: No Does patient have a court date: No Is patient on probation?: No  Psychosis Hallucinations: None noted Delusions: None  noted  Mental Status Report Appearance/Hygiene: In scrubs Eye Contact: Good Motor Activity: Unremarkable Speech: Logical/coherent Level of Consciousness: Alert Mood: Other (Comment), Angry (cooperative, anger when discussing wife) Affect: Other (Comment) (mood congruent) Anxiety Level: Minimal Thought Processes: Coherent, Relevant Judgement: Partial Orientation: Person, Place, Time, Situation Obsessive Compulsive Thoughts/Behaviors: None  Cognitive Functioning Concentration: Fair Memory: Recent Intact, Remote Intact IQ: Average Insight: Fair Impulse Control: Fair Appetite: Good Weight Loss: 30 (within last 6 months) Weight Gain: 0 Sleep: No Change Total Hours of Sleep: 8 Vegetative Symptoms: None  ADLScreening [facility]) Patient's cognitive ability adequate to safely complete daily activities?: Yes Patient able to express need for assistance with ADLs?: Yes Independently performs ADLs?: Yes (appropriate for developmental age)  Prior Inpatient Therapy Prior Inpatient Therapy: Yes Prior Therapy Dates: 2016 [facility] Reason for Treatment: Suicidal Gesture  Prior Outpatient Therapy Prior Outpatient Therapy: Yes Prior Therapy Dates: last attended 12/16 due to loss of insurance [facility] Does patient have an ACCT team?: No Does patient have Intensive In-House Services?  : No Does patient have [facility] services? : No Does patient have P4CC services?: No  ADL Screening (condition at time of admission) Patient's cognitive ability adequate to safely complete daily activities?: Yes Is the patient deaf or have difficulty hearing?: No Does the patient have difficulty seeing, even when wearing glasses/contacts?: No Does the patient have difficulty concentrating, remembering, or making decisions?: No Patient able to express need for assistance with ADLs?: Yes Does the patient have difficulty  dressing or bathing?: No Independently performs ADLs?: Yes (appropriate for developmental age) Does the patient have difficulty walking or climbing stairs?: No Weakness of Legs: None Weakness of Arms/Hands: None  Home Assistive Devices/Equipment Home Assistive Devices/Equipment: None  Therapy Consults (therapy consults require a physician order) PT Evaluation Needed: No OT Evalulation Needed: No SLP Evaluation Needed: No Abuse/Neglect Assessment (Assessment to be complete while patient is alone) Physical Abuse: Denies Verbal Abuse: Denies Sexual Abuse: Denies Exploitation of patient/patient's resources: Denies Self-Neglect: Denies Values / Beliefs Cultural Requests During Hospitalization: None Spiritual Requests During Hospitalization: None Consults Spiritual Care Consult Needed: No Social Work Consult Needed: No Merchant navy officerAdvance Directives (For Healthcare) Does patient have an advance directive?: Yes Would patient like information on creating an advanced directive?: No - patient declined information Type of Advance Directive: MidwifeHealthcare Power of Attorney (Wife is pt's POA) Does patient want to make changes to advanced directive?: No - Patient declined Copy of advanced directive(s) in chart?: Yes (Pt reports hospital recieved copy of poa 1 yr ago)    Additional Information 1:1 In Past 12 Months?: No CIRT Risk: No Elopement Risk: No Does patient have medical clearance?: No     Disposition:  Disposition Initial Assessment Completed for this Encounter: Yes Disposition of Patient: Other dispositions Other disposition(s): Other (Comment) (Pending Psychiatric Recommendation)  On Site Evaluation by:   Reviewed with Physician:    Melynda RipplePerry, [name] 10:08 AM
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
## 2016-01-24 NOTE — ED Provider Notes (Signed)
[facility]-EMERGENCY DEPT Provider Note   CSN: [id number] Arrival date & time: 01/24/16  1191  By signing my name below, I, Majel Homer, attest that this documentation has been prepared under the direction and in the presence of non-physician practitioner, Dorthula Matas, PA-C. Electronically Signed: Majel Homer, Scribe. 01/24/2016. 4:10 AM.  History   Chief Complaint Chief Complaint  Patient presents with  . IVC   The history is provided by the patient. No language interpreter was used.   HPI Comments: Cutberto Winfree is a 55 y.o. male with PMHx of HTN, who presents to the Emergency Department for an evaluation s/p IVC by his wife. Pt reports he was "having a bad night" and became upset with his wife because she didn't call and "did some stupid things." Per [employer], pt was found with multiple guns and gasoline in his possession and was threatening to burn his house down. Pt denies wanting to burn his house down, SI, HI, and auditory or visual hallucinations. He reports hx of alcoholism but states he has been sober from EtOH for the past 6 months and has not used any recreational drugs.   Per [employer], the patient was in the bushes while the SWAT team was trying to control the situation for over 3 hours.  Past Medical History:  Diagnosis Date  . Heart murmur   . Hypertension    Patient Active Problem List   Diagnosis Date Noted  . Major depressive disorder, single episode, severe without psychotic features (HCC)   . Alcohol abuse 05/20/2014  . Depression 05/20/2014  . Suicide attempt (HCC) 05/20/2014  . Suicidal ideations   . INGUINAL HERNIA 10/12/2009  . HEMORRHOIDS, WITH BLEEDING 09/28/2008  . HYPERLIPIDEMIA 08/23/2008  . ERECTILE DYSFUNCTION 07/26/2008  . HYPERTENSION 07/26/2008  . ALLERGIC RHINITIS 07/26/2008  . GERD 07/26/2008  . PEPTIC ULCER DISEASE 07/26/2008  . NONSPECIFIC ABNORMAL ELECTROCARDIOGRAM 07/26/2008   History reviewed. No pertinent surgical history.  Home Medications      Prior to Admission medications   Medication Sig Start Date End Date Taking? Authorizing Provider  amLODipine-olmesartan (AZOR) 5-20 MG per tablet Take 1 tablet by mouth 2 (two) times daily. For high blood pressure 05/24/14  Yes Shuvon B Rankin, NP  aspirin 81 MG tablet Take 162 mg by mouth daily.    Yes Historical Provider, MD  gabapentin (NEURONTIN) 300 MG capsule Take 1 capsule (300 mg total) by mouth 3 (three) times daily. For agitation 05/24/14  Yes Shuvon B Rankin, NP  sertraline (ZOLOFT) 50 MG tablet Take 50 mg by mouth daily.   Yes Historical Provider, MD  zolpidem (AMBIEN) 5 MG tablet Take 1 tablet (5 mg total) by mouth at bedtime as needed for sleep. Patient not taking: Reported on 06/19/2014 05/24/14   Talmage Nap, NP    Family History Family History  Problem Relation Age of Onset  . Heart disease Mother   . Hypertension Mother   . Stroke Mother   . Heart disease Father     Social History Social History  Substance Use Topics  . Smoking status: Former Games developer  . Smokeless tobacco: Never Used  . Alcohol use 10.8 oz/week    4 Glasses of wine, 14 Standard drinks or equivalent per week   Allergies   Review of patient's allergies indicates no known allergies.   Review of Systems Review of Systems  Constitutional: Negative for fever.  Psychiatric/Behavioral: Negative for hallucinations and suicidal ideas.   Physical Exam Updated Vital Signs BP (!) 164/104 (  BP Location: Left Arm)   Pulse 103   Temp 97.8 F (36.6 C) (Oral)   Resp 20   Ht 5\' 10"  (1.778 m)   Wt 185 lb (83.9 kg)   SpO2 96%   BMI 26.54 kg/m   Physical Exam  Constitutional: He appears well-developed and well-nourished. No distress.  HENT:  Head: Normocephalic and atraumatic.  Eyes: Pupils are equal, round, and reactive to light.  Neck: Normal range of motion. Neck supple.  Cardiovascular: Normal rate and regular rhythm.   Pulmonary/Chest: Effort normal.  Abdominal: Soft.  Neurological: He is  alert.  Skin: Skin is warm and dry.  Psychiatric:  Pt inappropriately happy.  Nursing note and vitals reviewed.  ED Treatments / Results  Labs (all labs ordered are listed, but only abnormal results are displayed) Labs Reviewed  COMPREHENSIVE METABOLIC PANEL - Abnormal; Notable for the following:       Result Value   Glucose, Bld 131 (*)    All other components within normal limits  ACETAMINOPHEN LEVEL - Abnormal; Notable for the following:    Acetaminophen (Tylenol), Serum <10 (*)    All other components within normal limits  CBC - Abnormal; Notable for the following:    MCHC 36.2 (*)    All other components within normal limits  ETHANOL  SALICYLATE LEVEL  URINE RAPID DRUG SCREEN, HOSP PERFORMED    EKG  EKG Interpretation None       Radiology No results found.  Procedures Procedures (including critical care time)  Medications Ordered in ED Medications  LORazepam (ATIVAN) tablet 1 mg (not administered)  acetaminophen (TYLENOL) tablet 650 mg (not administered)  ibuprofen (ADVIL,MOTRIN) tablet 600 mg (not administered)  zolpidem (AMBIEN) tablet 5 mg (not administered)  nicotine (NICODERM CQ - dosed in mg/24 hours) patch 21 mg (not administered)  ondansetron (ZOFRAN) tablet 4 mg (not administered)  alum & mag hydroxide-simeth (MAALOX/MYLANTA) 200-200-20 MG/5ML suspension 30 mL (not administered)  amLODipine-olmesartan (AZOR) 5-20 MG per tablet 1 tablet (not administered)  aspirin tablet 162 mg (not administered)  gabapentin (NEURONTIN) capsule 300 mg (not administered)  sertraline (ZOLOFT) tablet 50 mg (not administered)    DIAGNOSTIC STUDIES:  Oxygen Saturation is 96% on RA, normal by my interpretation.    COORDINATION OF CARE:  4:05 AM Discussed treatment plan with pt at bedside and pt agreed to plan.  Initial Impression / Assessment and Plan / ED Course  I have reviewed the triage vital signs and the nursing notes.  Pertinent labs & imaging results that  were available during my care of the patient were reviewed by me and considered in my medical decision making (see chart for details).  Clinical Course    Psych holding orders placed Home meds reviewed and ordered as appropriate TTS consult ordered Labs have resulted unremarkable.  Vitals:   01/24/16 0317 01/24/16 0423  BP: (!) 164/104 134/88  Pulse: 103 80  Resp: 20 20  Temp: 97.8 F (36.6 C) 98.1 F (36.7 C)    I personally performed the services described in this documentation, which was scribed in my presence. The recorded information has been reviewed and is accurate.   Final Clinical Impressions(s) / ED Diagnoses   Final diagnoses:  Homicidal ideation    New Prescriptions New Prescriptions   No medications on file     Marlon Peliffany Mekenna Finau, PA-C 01/24/16 0439    Gwyneth SproutWhitney Plunkett, MD 01/24/16 [phone]

## 2016-01-24 NOTE — ED Notes (Signed)
Bed: [facility] Expected date:  Expected time:  Means of arrival:  Comments: 

## 2016-01-24 NOTE — BH Assessment (Signed)
Clinician consulted with Donell SievertSpencer Simon, PA and pt meets criteria for inpatient admission. PA recommends [facility] placement. Marlon Peliffany Greene PA-C informed of pt disposition.

## 2016-01-24 NOTE — ED Notes (Signed)
Patient reports increase anxiety. Respirations equal and unlabored, skin warm and dry. NO acute distress noted. Will medicate patient per MAR.

## 2016-01-24 NOTE — ED Notes (Signed)
Pt has been wanded by security. Pt has $12 in his wallet( all ones).

## 2016-01-24 NOTE — ED Notes (Signed)
Patient denies SI, HI and AVH at this. Patient states "my wife just doesn't listen to me". Plan of care discussed with patient. Patient voices no complaints or concerns at this time. Encouragement and support provided and safety maintain. Q 15 min safety checks in place and video monitoring.

## 2016-01-24 NOTE — Consult Note (Signed)
[facility] Consult   Reason for Consult:  Psychiatric Evaluation Referring Physician:  EDP Patient Identification: Jared [name] MRN:  [id number] Principal Diagnosis: Intermittent explosive disorder Diagnosis:   Patient Active Problem List   Diagnosis Date Noted  . Intermittent explosive disorder [F63.81] 01/24/2016  . Major depressive disorder, single episode, severe without psychotic features ([place]) [F32.2]   . Alcohol abuse [F10.10] 05/20/2014  . Depression [F32.9] 05/20/2014  . Suicide attempt ([place]) [T14.91] 05/20/2014  . Suicidal ideations [R45.851]   . INGUINAL HERNIA [K40.90] 10/12/2009  . HEMORRHOIDS, WITH BLEEDING [K64.9] 09/28/2008  . HYPERLIPIDEMIA [E78.5] 08/23/2008  . ERECTILE DYSFUNCTION [F52.8] 07/26/2008  . HYPERTENSION [I10] 07/26/2008  . ALLERGIC RHINITIS [J30.9] 07/26/2008  . GERD [K21.9] 07/26/2008  . PEPTIC ULCER DISEASE [K27.9] 07/26/2008  . NONSPECIFIC ABNORMAL ELECTROCARDIOGRAM [R94.31] 07/26/2008    Total Time spent with patient: 45 minutes  Subjective:   Jared [name] is a 55 y.o. male patient who states "I had a bad night."  HPI:  Per Behavioral Health Therapeutic Triage assessment,   Per petition:  Respondent sent texts to his wife stating he wanted to kill himself and burn down the house. He sent pictures filling up gasoline cans and a video of himself placing them around the house.  Per Ivar Drape, pt was intially uncooperative and aggited upon law enforcement's arrival. Pt refused to exit the home and stated that he had firearms in the house. Pt threatened to shoot at law enforcement and "he had spot lights pointing down the driveway and was looking at [name] through the barrel of his riffle". Law enforcement called in [employer] Pt attempted to leave prior to [employer]s arrival. In doing so pt ran over spikes with his vehicle and attempted to return to the home. Pt was arrested at that time. Sheriff reports Event organiser was at  pt's home for several hours in attempts to apprehend him. Pt has no pending criminal charges.   Pt states "I had a bad day. I got pissed off with my wife and did some stupid things" and "Tonight I just got stupid and did some stupid stuff I should not have dont and I know better but, I was just so irritated with her (wife). Pt denies suicidal and homicidal ideation. Pt states he suicidal text messages were to "piss her off (wife)". Pt also denies intent to burn down the house. Pt states this too was to "piss off" wife. Pt states he actually filled the gas cans with gasoline that he intended to use for his lawn mower. Pt has history of one inpatient admission (04/2014) at [facility] after putting a gun to his head. Pt states "no I didn't want to kill myself" and again reports suicidal gestures was to "piss her off (wife), and it worked too". Pt denies history of mental health conditions but does report being on medication for his "moods". Pt reports compliance with medication "for the most part",  although he questions their effectiveness.  In regards to medication, pt states "I need something else to control my agitation". Pt's chart notes history of dysthymic disorder. Pt denies depressive symptoms. Pt has history of Alcohol Abuse and states he is currently 6 months sober. Pt reports no history of self-injurious behaviors. Pt denies hallucinations and does not appear to be responding to internal stimuli.  Pt reports access to firearms. Pt is not currently followed by any outpatient providers.  [facility] Evaluation: Chart reviewed. Jared [name] is seen face-to-face today with Dr. Darleene Cleaver. Jared [name] is alert,  calm, and cooperative. He states he had was "tired of my wife ignoring me. All I want her to do is call me when she is done with work and she doesn't. She makes me feel like I am last on her list and this made me more agitated." "I got mad and I got mean." "I got gas cans and threatened to burn the house and I sent her  a text message telling her that."  "I wasn't really going to burn the house because my dogs were in there and I love my house." "I was just trying to get her to come home." The patient states he feels unappreciated and feels like others come before him. He denies using alcohol or drugs prior to this incidents. He states he is 6 months sober from alcohol; his BAL <5 and UDS was negative.  He states he take gabapentin 300 mg TID and Zoloft 50 mg daily "but I don't want to take Zoloft any more" stating he doesn't feel he needs it. He states he has noticed he becomes "agitated in the afternoon at 4p." He does not identify a trigger for that. He states that he is often irritable and gets angry easily.   He denies suicidal ideation, intent or plan. He denies homicidal ideation, intent or plan. He denies AVH.   Past Psychiatric History: Alcohol abuse  Risk to Self: Suicidal Ideation: No Suicidal Intent: No Is patient at risk for suicide?: No Suicidal Plan?: No Access to Means: No What has been your use of drugs/alcohol within the last 12 months?: Pt reports he is an alcoholic and is in [place]. Pt has been sober "almost six months". How many times?: 1 Other Self Harm Risks: poor impulse control, h/o attempt, impaired judgement Triggers for Past Attempts: Other (Comment) (anger/conflict with wife) Intentional Self Injurious Behavior: None Risk to Others: Homicidal Ideation: No Thoughts of Harm to Others: No (Pt denies) Current Homicidal Intent: No Current Homicidal Plan: No Access to Homicidal Means: No History of harm to others?: No (Pt denies) Assessment of Violence: None Noted Does patient have access to weapons?: Yes (Comment) (access to "plenty of" firearms) Criminal Charges Pending?: No Does patient have a court date: No Prior Inpatient Therapy: Prior Inpatient Therapy: Yes Prior Therapy Dates: 2016 Prior Therapy Facilty/Provider(s): [facility] Reason for Treatment: Suicidal Gesture Prior  Outpatient Therapy: Prior Outpatient Therapy: Yes Prior Therapy Dates: last attended 12/16 due to loss of insurance Prior Therapy Facilty/Provider(s): [facility] Does patient have an ACCT team?: No Does patient have Intensive In-House Services?  : No Does patient have [facility] services? : No Does patient have P4CC services?: No  Past Medical History:  Past Medical History:  Diagnosis Date  . Heart murmur   . Hypertension    History reviewed. No pertinent surgical history. Family History:  Family History  Problem Relation Age of Onset  . Heart disease Mother   . Hypertension Mother   . Stroke Mother   . Heart disease Father    Family Psychiatric  History: Unknown Social History:  History  Alcohol Use  . 10.8 oz/week  . 4 Glasses of wine, 14 Standard drinks or equivalent per week     History  Drug Use No    Social History   Social History  . Marital status: Married    Spouse name: N/A  . Number of children: N/A  . Years of education: N/A   Social History Main Topics  . Smoking status: Former Research scientist (life sciences)  .  Smokeless tobacco: Never Used  . Alcohol use 10.8 oz/week    4 Glasses of wine, 14 Standard drinks or equivalent per week  . Drug use: No  . Sexual activity: Yes    Birth control/ protection: None   Other Topics Concern  . None   Social History Narrative  . None   Additional Social History:    Allergies:  No Known Allergies  Labs:  Results for orders placed or performed during the hospital encounter of 01/24/16 (from the past 48 hour(s))  Rapid urine drug screen (hospital performed)     Status: None   Collection Time: 01/24/16  3:20 AM  Result Value Ref Range   Opiates NONE DETECTED NONE DETECTED   Cocaine NONE DETECTED NONE DETECTED   Benzodiazepines NONE DETECTED NONE DETECTED   Amphetamines NONE DETECTED NONE DETECTED   Tetrahydrocannabinol NONE DETECTED NONE DETECTED   Barbiturates NONE DETECTED NONE DETECTED    Comment:        DRUG SCREEN FOR  MEDICAL PURPOSES ONLY.  IF CONFIRMATION IS NEEDED FOR ANY PURPOSE, NOTIFY LAB WITHIN 5 DAYS.        LOWEST DETECTABLE LIMITS FOR URINE DRUG SCREEN Drug Class       Cutoff (ng/mL) Amphetamine      1000 Barbiturate      200 Benzodiazepine   242 Tricyclics       683 Opiates          300 Cocaine          300 THC              50   Comprehensive metabolic panel     Status: Abnormal   Collection Time: 01/24/16  3:25 AM  Result Value Ref Range   Sodium 141 135 - 145 mmol/L   Potassium 3.9 3.5 - 5.1 mmol/L   Chloride 108 101 - 111 mmol/L   CO2 25 22 - 32 mmol/L   Glucose, Bld 131 (H) 65 - 99 mg/dL   BUN 14 6 - 20 mg/dL   Creatinine, Ser 0.90 0.61 - 1.24 mg/dL   Calcium 9.6 8.9 - 10.3 mg/dL   Total Protein 7.7 6.5 - 8.1 g/dL   Albumin 4.9 3.5 - 5.0 g/dL   AST 36 15 - 41 U/L   ALT 50 17 - 63 U/L   Alkaline Phosphatase 74 38 - 126 U/L   Total Bilirubin 0.9 0.3 - 1.2 mg/dL   GFR calc non Af Amer >60 >60 mL/min   GFR calc Af Amer >60 >60 mL/min    Comment: (NOTE) The eGFR has been calculated using the CKD EPI equation. This calculation has not been validated in all clinical situations. eGFR's persistently <60 mL/min signify possible Chronic Kidney Disease.    Anion gap 8 5 - 15  Ethanol     Status: None   Collection Time: 01/24/16  3:25 AM  Result Value Ref Range   Alcohol, Ethyl (B) <5 <5 mg/dL    Comment:        LOWEST DETECTABLE LIMIT FOR SERUM ALCOHOL IS 5 mg/dL FOR MEDICAL PURPOSES ONLY   Salicylate level     Status: None   Collection Time: 01/24/16  3:25 AM  Result Value Ref Range   Salicylate Lvl <4.1 2.8 - 30.0 mg/dL  Acetaminophen level     Status: Abnormal   Collection Time: 01/24/16  3:25 AM  Result Value Ref Range   Acetaminophen (Tylenol), Serum <10 (L) 10 - 30 ug/mL  Comment:        THERAPEUTIC CONCENTRATIONS VARY SIGNIFICANTLY. A RANGE OF 10-30 ug/mL MAY BE AN EFFECTIVE CONCENTRATION FOR MANY PATIENTS. HOWEVER, SOME ARE BEST TREATED AT  CONCENTRATIONS OUTSIDE THIS RANGE. ACETAMINOPHEN CONCENTRATIONS >150 ug/mL AT 4 HOURS AFTER INGESTION AND >50 ug/mL AT 12 HOURS AFTER INGESTION ARE OFTEN ASSOCIATED WITH TOXIC REACTIONS.   cbc     Status: Abnormal   Collection Time: 01/24/16  3:25 AM  Result Value Ref Range   WBC 8.4 4.0 - 10.5 K/uL   RBC 4.63 4.22 - 5.81 MIL/uL   Hemoglobin 14.9 13.0 - 17.0 g/dL   HCT 41.2 39.0 - 52.0 %   MCV 89.0 78.0 - 100.0 fL   MCH 32.2 26.0 - 34.0 pg   MCHC 36.2 (H) 30.0 - 36.0 g/dL   RDW 12.7 11.5 - 15.5 %   Platelets 233 150 - 400 K/uL    Current Facility-Administered Medications  Medication Dose Route Frequency Provider Last Rate Last Dose  . acetaminophen (TYLENOL) tablet 650 mg  650 mg Oral Q4H PRN Delos Haring, PA-C      . alum & mag hydroxide-simeth (MAALOX/MYLANTA) 200-200-20 MG/5ML suspension 30 mL  30 mL Oral PRN Delos Haring, PA-C      . amLODipine (NORVASC) tablet 5 mg  5 mg Oral BID Shuvon B Rankin, NP   5 mg at 01/24/16 1050   And  . irbesartan (AVAPRO) tablet 150 mg  150 mg Oral BID Shuvon B Rankin, NP   150 mg at 01/24/16 1050  . aspirin EC tablet 162 mg  162 mg Oral Daily Delos Haring, PA-C   162 mg at 01/24/16 3818  . ibuprofen (ADVIL,MOTRIN) tablet 600 mg  600 mg Oral Q8H PRN Delos Haring, PA-C      . LORazepam (ATIVAN) tablet 1 mg  1 mg Oral Q8H PRN Delos Haring, PA-C   1 mg at 01/24/16 0454  . nicotine (NICODERM CQ - dosed in mg/24 hours) patch 21 mg  21 mg Transdermal Daily Tiffany Greene, PA-C      . ondansetron (ZOFRAN) tablet 4 mg  4 mg Oral Q8H PRN Delos Haring, PA-C      . Oxcarbazepine (TRILEPTAL) tablet 300 mg  300 mg Oral BID Corena Pilgrim, MD      . Derrill Memo ON 01/25/2016] sertraline (ZOLOFT) tablet 25 mg  25 mg Oral Daily Berdena Cisek, MD      . traZODone (DESYREL) tablet 50 mg  50 mg Oral QHS PRN Corena Pilgrim, MD       Current Outpatient Prescriptions  Medication Sig Dispense Refill  . amLODipine-olmesartan (AZOR) 5-20 MG per tablet Take  1 tablet by mouth 2 (two) times daily. For high blood pressure 60 tablet 0  . aspirin 81 MG tablet Take 162 mg by mouth daily.     Marland Kitchen gabapentin (NEURONTIN) 300 MG capsule Take 1 capsule (300 mg total) by mouth 3 (three) times daily. For agitation 90 capsule 0  . sertraline (ZOLOFT) 50 MG tablet Take 50 mg by mouth daily.    Marland Kitchen zolpidem (AMBIEN) 5 MG tablet Take 1 tablet (5 mg total) by mouth at bedtime as needed for sleep. (Patient not taking: Reported on 06/19/2014) 30 tablet 0    Musculoskeletal: Strength & Muscle Tone: within normal limits Gait & Station: normal Patient leans: N/A  Psychiatric Specialty Exam: Physical Exam  Constitutional: He is oriented to person, place, and time. He appears well-developed and well-nourished.  HENT:  Head: Normocephalic.  Neck: Normal  range of motion.  Respiratory: Effort normal.  Musculoskeletal: Normal range of motion.  Neurological: He is alert and oriented to person, place, and time.  Skin: Skin is warm and dry.  Psychiatric: His speech is normal. Thought content normal. His mood appears anxious. Cognition and memory are normal. He expresses impulsivity.    Review of Systems  Constitutional: Negative.   HENT: Negative.   Eyes: Negative.   Respiratory: Negative.   Cardiovascular: Negative.   Gastrointestinal: Negative.   Genitourinary: Negative.   Musculoskeletal: Negative.   Skin: Negative.   Neurological: Negative.   Endo/Heme/Allergies: Negative.   Psychiatric/Behavioral:       Irritability and anger    Blood pressure 134/88, pulse 80, temperature 98.1 F (36.7 C), temperature source Oral, resp. rate 20, height '5\' 10"'$  (1.778 m), weight 83.9 kg (185 lb), SpO2 97 %.Body mass index is 26.54 kg/m.  General Appearance: Fairly Groomed  Eye Contact:  Good  Speech:  Clear and Coherent and Pressured  Volume:  Normal  Mood:  Anxious  Affect:  Congruent  Thought Process:  Coherent and Goal Directed  Orientation:  Full (Time, Place, and  Person)  Thought Content:  Logical  Suicidal Thoughts:  No  Homicidal Thoughts:  No  Memory:  Immediate;   Good Recent;   Good Remote;   Good  Judgement:  Poor  Insight:  Fair  Psychomotor Activity:  Normal  Concentration:  Concentration: Good and Attention Span: Good  Recall:  Good  Fund of Knowledge:  Good  Language:  Good  Akathisia:  No  Handed:  Right  AIMS (if indicated):     Assets:  Communication Skills Desire for Improvement Housing Physical Health  ADL's:  Intact  Cognition:  WNL  Sleep:       Case discussed with Dr. Darleene Cleaver; recommendations are: Treatment Plan Summary: Daily contact with patient to assess and evaluate symptoms and progress in treatment and Medication management  -Continue home medications for medical conditions- -Taper Zoloft 25 mg [name] x 5 days -Start Trileptal 300 mg [name] BID for mood stabilization and irritability -Trazodone 50 mg [name] QHS prn insomnia  Disposition: Recommend psychiatric Inpatient admission when medically cleared.  Serena Colonel, FNP-BC [place] 01/24/2016 11:39 AM  Patient seen face-to-face for psychiatric evaluation, chart reviewed and case discussed with the physician extender and developed treatment plan. Reviewed the information documented and agree with the treatment plan. Corena Pilgrim, MD

## 2016-01-24 NOTE — Progress Notes (Signed)
Pt. Was initially angry when Clinical research associatewriter arrived.   Pt. Denies SI and HI, no complaints of pain or discomfort noted.  A Writer offered support and encouragement.  Explained to pt. The reason for his admission.  As well as discussed [facility] and possible medication needs.  R Pt. Did calm down after our conversation.  Pt.'s wife is presently at his side.  Writer explained to the wife that he had calmed and had assured Clinical research associatewriter he would  Stay calm,  Wife was also instructed to keep her voice low key to ensure pt. Stays calm.  Pt. Remains safe on the unit.

## 2016-01-24 NOTE — BH Assessment (Addendum)
Tele Assessment Note   Jared [name] is an 55 y.o. male presenting involuntarily and accompanied by [facility] for assessment. Pt was petitioned by wife Adil Tugwell [phone]). Per petition:  Respondent sent texts to his wife stating he wanted to kill himself and burn down the house. He sent pictures filling up gasoline cans and a video of himself placing them around the house.  -----  Per Cordie Grice, pt was intially uncooperative and aggited upon law enforcement's arrival. Pt refused to exit the home and stated that he had firearms in the house. Pt threatened to shoot at law enforcement and "he had spot lights pointing down the driveway and was looking at [name] through the barrel of his riffle". Law enforcement called in [employer] Pt attempted to leave prior to [employer]s arrival. In doing so pt ran over spikes with his vehicle and attempted to return to the home. Pt was arrested at that time. Sheriff reports Patent examiner was at pt's home for several hours in attempts to apprehend him. Pt has no pending criminal charges.   Sheriff Art therapist provided contact information for [facility] for additional collateral information if needed. Vivia Ewing [facility] [phone], [phone]) was also present during Pt's apprehension.   -----  Pt states "I had a bad day. I got pissed off with my wife and did some stupid things" and "Tonight I just got stupid and did some stupid stuff I should not have dont and I know better but, I was just so irritated with her (wife). Pt denies suicidal and homicidal ideation. Pt states he suicidal text messages were to "piss her off (wife)". Pt also denies intent to burn down the house. Pt states this too was to "piss off" wife. Pt states he actually filled the gas cans with gasoline that he intended to use for his lawn mower. Pt has history of one inpatient admission (04/2014) at [facility] after putting a gun to his head.  Pt states "no I didn't want to kill myself" and again reports suicidal gestures was to "piss her off (wife), and it worked too". Pt denies history of mental health conditions but does report being on medication for his "moods". Pt reports compliance with medication "for the most part",  although he questions their effectiveness.  In regards to medication, pt states "I need something else to control my agitation". Pt's chart notes history of dysthymic disorder. Pt denies depressive symptoms. Pt has history of Alcohol Abuse and states he is currently 6 months sober. Pt reports no history of self-injurious behaviors. Pt denies hallucinations and does not appear to be responding to internal stimuli.  Pt reports access to firearms. Pt is not currently followed by any outpatient providers.    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 Heart murmur   . Hypertension     History reviewed. No pertinent surgical history.  Family History:  Family History  Problem Relation Age of Onset  . Heart disease Mother   . Hypertension Mother   . Stroke Mother   . Heart disease Father     Social History:  reports that he has quit smoking. He has never used smokeless tobacco. He reports that he drinks about 10.8 oz of alcohol per week . He reports that he does not use drugs.  Additional Social History:  Alcohol / Drug Use Pain Medications: No abuse reported.  Prescriptions: No abuse reported Over the Counter: No abuse reported Longest period of sobriety (when/how long): Pt is currently 6 mths sober. Withdrawal Symptoms: Seizures Onset of Seizures: Not Reported Date of most recent seizure: 7013yrs ago Substance #1 Name of Substance 1: Alcohol 1 - Age of First Use: 16 1 - Amount (size/oz): 1-2 bottles of red wine (heaviest use) 1 - Frequency: daily 1 - Duration: 5-10 years 1 - Last Use / Amount: 6  months ago  CIWA: CIWA-Ar BP: 134/88 Pulse Rate: 80 COWS:    PATIENT STRENGTHS: (choose at least two) Average or above average intelligence General fund of knowledge  Allergies: No Known Allergies  Home Medications:  (Not in a hospital admission)  OB/GYN Status:  No LMP for male patient.  General Assessment Data [facility] Assessment: In system Is this a Tele or Face-to-Face Assessment?: Face-to-Face Is this an Initial Assessment or a Re-assessment for this encounter?: Initial Assessment Marital status: Married Is patient pregnant?: No Pregnancy Status: No Living Arrangements: Spouse/significant other Can pt return to current living arrangement?: Yes Admission Status: Involuntary Is patient capable of signing voluntary admission?: No Referral Source: Self/Family/Friend Insurance type: Self-Pay     Crisis Care Plan Living Arrangements: Spouse/significant other Name of Psychiatrist: None Name of Therapist: None  Education Status Is patient currently in school?: No Highest grade of school patient has completed: Chief Operating OfficerBachelors Name of school: Not Reported  Risk to self with the past 6 months Suicidal Ideation: No Suicidal Intent: No Has patient had any suicidal intent within the past 6 months prior to admission? : No Is patient at risk for suicide?: No Suicidal Plan?: No Has patient had any suicidal plan within the past 6 months prior to admission? : No Access to Means: No What has been your use of drugs/alcohol within the last 12 months?: Pt reports he is an alcoholic and is in [place]. Pt has been sober "almost six months". Previous Attempts/Gestures: Yes (Pt initially denied previous gesture) How many times?: 1 Other Self Harm Risks: poor impulse control, h/o attempt, impaired judgement Triggers for Past Attempts: Other (Comment) (anger/conflict with wife) Intentional Self Injurious Behavior: None Recent stressful life event(s):  Conflict (Comment) (conflict w/ wife & step-daughter) Persecutory voices/beliefs?: No Depression: No Depression Symptoms: Feeling angry/irritable Substance abuse history and/or treatment for substance abuse?: Yes Suicide prevention information given to non-admitted patients: Yes  Risk to Others within the past 6 months Homicidal Ideation: No Does patient have any lifetime risk of violence toward others beyond the six months prior to admission? : No Thoughts of Harm to Others: No (Pt denies) Current Homicidal Intent: No Current Homicidal Plan: No Access to Homicidal Means: No History of harm to others?: No (Pt denies) Assessment of Violence: None Noted Does patient have access to weapons?: Yes (Comment) (access to "plenty of" firearms) Criminal Charges Pending?: No Does patient have a court date: No Is patient on probation?: No  Psychosis Hallucinations: None noted Delusions: None noted  Mental Status Report Appearance/Hygiene: In scrubs Eye Contact: Good Motor Activity: Unremarkable Speech: Logical/coherent Level of Consciousness: Alert Mood: Other (Comment), Angry (cooperative, anger when discussing wife) Affect: Other (Comment) (mood congruent) Anxiety Level: Minimal Thought Processes: Coherent, Relevant Judgement: Partial Orientation: Person, Place, Time, Situation Obsessive Compulsive Thoughts/Behaviors: None  Cognitive Functioning Concentration: Fair Memory: Recent Intact, Remote Intact IQ: Average Insight: Fair Impulse Control: Fair Appetite: Good Weight Loss: 30 (within last 6 months) Weight Gain: 0 Sleep: No Change Total Hours of Sleep:  8 Vegetative Symptoms: None  ADLScreening [facility]) Patient's cognitive ability adequate to safely complete daily activities?: Yes Patient able to express need for assistance with ADLs?: Yes Independently performs ADLs?: Yes (appropriate for developmental age)  Prior Inpatient Therapy Prior Inpatient  Therapy: Yes Prior Therapy Dates: 2016 Prior Therapy Facilty/Provider(s): [facility] Reason for Treatment: Suicidal Gesture  Prior Outpatient Therapy Prior Outpatient Therapy: Yes Prior Therapy Dates: last attended 12/16 due to loss of insurance Prior Therapy Facilty/Provider(s): [facility] Does patient have an ACCT team?: No Does patient have Intensive In-House Services?  : No Does patient have [facility] services? : No Does patient have P4CC services?: No  ADL Screening (condition at time of admission) Patient's cognitive ability adequate to safely complete daily activities?: Yes Is the patient deaf or have difficulty hearing?: No Does the patient have difficulty seeing, even when wearing glasses/contacts?: No Does the patient have difficulty concentrating, remembering, or making decisions?: No Patient able to express need for assistance with ADLs?: Yes Does the patient have difficulty dressing or bathing?: No Independently performs ADLs?: Yes (appropriate for developmental age) Does the patient have difficulty walking or climbing stairs?: No Weakness of Legs: None Weakness of Arms/Hands: None  Home Assistive Devices/Equipment Home Assistive Devices/Equipment: None  Therapy Consults (therapy consults require a physician order) PT Evaluation Needed: No OT Evalulation Needed: No SLP Evaluation Needed: No Abuse/Neglect Assessment (Assessment to be complete while patient is alone) Physical Abuse: Denies Verbal Abuse: Denies Sexual Abuse: Denies Exploitation of patient/patient's resources: Denies Self-Neglect: Denies Values / Beliefs Cultural Requests During Hospitalization: None Spiritual Requests During Hospitalization: None Consults Spiritual Care Consult Needed: No Social Work Consult Needed: No Merchant navy officer (For Healthcare) Does patient have an advance directive?: Yes Would patient like information on creating an advanced directive?: No - patient declined  information Type of Advance Directive: Midwife (Wife is pt's POA) Does patient want to make changes to advanced directive?: No - Patient declined Copy of advanced directive(s) in chart?: Yes (Pt reports hospital recieved copy of poa 1 yr ago)    Additional Information 1:1 In Past 12 Months?: No CIRT Risk: No Elopement Risk: No Does patient have medical clearance?: No     Disposition: Clinician consulted with Donell Sievert, PA and pt meets criteria for inpatient admission. PA recommends [facility] placement. Marlon Pel PA-C informed of pt disposition. Disposition Initial Assessment Completed for this Encounter: Yes Disposition of Patient: Other dispositions Other disposition(s): Other (Comment) (Pending Psychiatric Recommendation)  Crisol Muecke J [place] 01/24/2016 4:38 AM

## 2016-01-25 ENCOUNTER — Encounter (HOSPITAL_COMMUNITY): Payer: Self-pay

## 2016-01-25 ENCOUNTER — Inpatient Hospital Stay (HOSPITAL_COMMUNITY)
Admission: AD | Admit: 2016-01-25 | Discharge: 2016-01-28 | DRG: 885 | Disposition: A | Discharge status: Other Acute Inpatient Hospital | Payer: BC Managed Care – PPO | Attending: Internal Medicine | Admitting: Internal Medicine

## 2016-01-25 DIAGNOSIS — Z79899 Other long term (current) drug therapy: Secondary | ICD-10-CM | POA: Diagnosis not present

## 2016-01-25 DIAGNOSIS — F322 Major depressive disorder, single episode, severe without psychotic features: Secondary | ICD-10-CM | POA: Diagnosis present

## 2016-01-25 DIAGNOSIS — E785 Hyperlipidemia, unspecified: Secondary | ICD-10-CM | POA: Diagnosis present

## 2016-01-25 DIAGNOSIS — I480 Paroxysmal atrial fibrillation: Secondary | ICD-10-CM | POA: Diagnosis present

## 2016-01-25 DIAGNOSIS — F339 Major depressive disorder, recurrent, unspecified: Secondary | ICD-10-CM | POA: Diagnosis present

## 2016-01-25 DIAGNOSIS — I4891 Unspecified atrial fibrillation: Secondary | ICD-10-CM | POA: Diagnosis present

## 2016-01-25 DIAGNOSIS — I5032 Chronic diastolic (congestive) heart failure: Secondary | ICD-10-CM | POA: Diagnosis present

## 2016-01-25 DIAGNOSIS — I10 Essential (primary) hypertension: Secondary | ICD-10-CM | POA: Diagnosis present

## 2016-01-25 DIAGNOSIS — F1021 Alcohol dependence, in remission: Secondary | ICD-10-CM | POA: Diagnosis present

## 2016-01-25 DIAGNOSIS — R011 Cardiac murmur, unspecified: Secondary | ICD-10-CM | POA: Diagnosis present

## 2016-01-25 DIAGNOSIS — Z87891 Personal history of nicotine dependence: Secondary | ICD-10-CM | POA: Diagnosis not present

## 2016-01-25 DIAGNOSIS — Z7982 Long term (current) use of aspirin: Secondary | ICD-10-CM | POA: Diagnosis not present

## 2016-01-25 DIAGNOSIS — F6381 Intermittent explosive disorder: Secondary | ICD-10-CM | POA: Diagnosis present

## 2016-01-25 DIAGNOSIS — Z8249 Family history of ischemic heart disease and other diseases of the circulatory system: Secondary | ICD-10-CM

## 2016-01-25 DIAGNOSIS — R45851 Suicidal ideations: Secondary | ICD-10-CM | POA: Diagnosis present

## 2016-01-25 DIAGNOSIS — F333 Major depressive disorder, recurrent, severe with psychotic symptoms: Secondary | ICD-10-CM | POA: Diagnosis not present

## 2016-01-25 DIAGNOSIS — F332 Major depressive disorder, recurrent severe without psychotic features: Secondary | ICD-10-CM | POA: Diagnosis not present

## 2016-01-25 DIAGNOSIS — Z823 Family history of stroke: Secondary | ICD-10-CM

## 2016-01-25 DIAGNOSIS — R55 Syncope and collapse: Secondary | ICD-10-CM

## 2016-01-25 DIAGNOSIS — I110 Hypertensive heart disease with heart failure: Secondary | ICD-10-CM | POA: Diagnosis present

## 2016-01-25 DIAGNOSIS — E7800 Pure hypercholesterolemia, unspecified: Secondary | ICD-10-CM | POA: Diagnosis not present

## 2016-01-25 DIAGNOSIS — F329 Major depressive disorder, single episode, unspecified: Secondary | ICD-10-CM | POA: Diagnosis present

## 2016-01-25 DIAGNOSIS — K219 Gastro-esophageal reflux disease without esophagitis: Secondary | ICD-10-CM | POA: Diagnosis present

## 2016-01-25 DIAGNOSIS — E782 Mixed hyperlipidemia: Secondary | ICD-10-CM | POA: Diagnosis not present

## 2016-01-25 HISTORY — DX: Ventricular premature depolarization: I49.3

## 2016-01-25 MED ORDER — GABAPENTIN 300 MG PO CAPS: 300.0000 mg | ORAL_CAPSULE | Freq: Three times a day (TID) | ORAL | Status: DC

## 2016-01-25 MED ORDER — ENSURE ENLIVE PO LIQD
237.0000 mL | Freq: Every day | ORAL | Status: DC | PRN
  Filled 2016-01-25: qty 237

## 2016-01-25 MED ORDER — GABAPENTIN 600 MG PO TABS
600.0000 mg | ORAL_TABLET | Freq: Two times a day (BID) | ORAL | Status: DC
  Administered 2016-01-25 – 2016-01-27 (×5): 600 mg via ORAL
  Filled 2016-01-25 (×9): qty 1

## 2016-01-25 MED ORDER — ALUM & MAG HYDROXIDE-SIMETH 200-200-20 MG/5ML PO SUSP: 30.0000 mL | ORAL | Status: DC | PRN

## 2016-01-25 MED ORDER — ACETAMINOPHEN 325 MG PO TABS: 650.0000 mg | ORAL_TABLET | Freq: Four times a day (QID) | ORAL | Status: DC | PRN

## 2016-01-25 MED ORDER — TRAZODONE HCL 50 MG PO TABS
50.0000 mg | ORAL_TABLET | Freq: Every evening | ORAL | Status: DC | PRN
  Administered 2016-01-25 – 2016-01-27 (×5): 50 mg via ORAL
  Filled 2016-01-25 (×9): qty 1

## 2016-01-25 MED ORDER — HYDROXYZINE HCL 25 MG PO TABS
25.0000 mg | ORAL_TABLET | Freq: Three times a day (TID) | ORAL | Status: DC | PRN
  Administered 2016-01-25 – 2016-01-28 (×6): 25 mg via ORAL
  Filled 2016-01-25 (×6): qty 1

## 2016-01-25 MED ORDER — MAGNESIUM HYDROXIDE 400 MG/5ML PO SUSP: 30.0000 mL | Freq: Every day | ORAL | Status: DC | PRN

## 2016-01-25 MED ORDER — INFLUENZA VAC SPLIT QUAD 0.5 ML IM SUSY
0.5000 mL | PREFILLED_SYRINGE | INTRAMUSCULAR | Status: AC
  Administered 2016-01-27: 0.5 mL via INTRAMUSCULAR
  Filled 2016-01-25: qty 0.5

## 2016-01-25 MED ORDER — AMLODIPINE BESYLATE 5 MG PO TABS
5.0000 mg | ORAL_TABLET | Freq: Every day | ORAL | Status: DC
  Administered 2016-01-25 – 2016-01-27 (×3): 5 mg via ORAL
  Filled 2016-01-25 (×6): qty 1

## 2016-01-25 MED ORDER — ASPIRIN EC 81 MG PO TBEC
162.0000 mg | DELAYED_RELEASE_TABLET | Freq: Every day | ORAL | Status: DC
  Administered 2016-01-25: 162 mg via ORAL
  Filled 2016-01-25 (×6): qty 2

## 2016-01-25 MED ORDER — IRBESARTAN 150 MG PO TABS
150.0000 mg | ORAL_TABLET | Freq: Every day | ORAL | Status: DC
  Administered 2016-01-25 – 2016-01-27 (×3): 150 mg via ORAL
  Filled 2016-01-25: qty 1
  Filled 2016-01-25: qty 2
  Filled 2016-01-25 (×4): qty 1

## 2016-01-25 NOTE — Progress Notes (Signed)
Report received from KincaidPamela, [place]. Waiting on BP to trend down, remaining vitals are stable. [facility].

## 2016-01-25 NOTE — Tx Team (Signed)
Initial Treatment Plan 01/25/2016 1:41 AM Alger SimonsPatrick Sur ZOX:[id number]    PATIENT STRESSORS: Financial difficulties Marital or family conflict Occupational concerns   PATIENT STRENGTHS: Ability for insight Active sense of humor Average or above average intelligence Capable of independent living General fund of knowledge Motivation for treatment/growth Supportive family/friends Work skills   PATIENT IDENTIFIED PROBLEMS:   At risk for suicide   Depression   Anger   "I get blame for everything"   " My wife ignores me and give me the silent treatment"            DISCHARGE CRITERIA:  Ability to meet basic life and health needs Adequate post-discharge living arrangements Improved stabilization in mood, thinking, and/or behavior Need for constant or close observation no longer present Reduction of life-threatening or endangering symptoms to within safe limits  PRELIMINARY DISCHARGE PLAN: Return to previous living arrangement Return to previous work or school arrangements  PATIENT/FAMILY INVOLVEMENT: This treatment plan has been presented to and reviewed with the patient, Alger Simonsatrick Hayashida. The patient and family have been given the opportunity to ask questions and make suggestions.  Tyrone AppleEmily  Alyona Romack, RN 01/25/2016, 1:41 AM

## 2016-01-25 NOTE — Progress Notes (Signed)
NUTRITION ASSESSMENT  Pt identified as at risk on the Malnutrition Screen Tool  INTERVENTION: 1. Educated patient on the importance of nutrition and encouraged intake of food and beverages. 2. Discussed weight goals. 3. Supplements: will order Ensure Enlive once/day PRN, this supplement provides 350 kcal and 20 grams of protein   NUTRITION DIAGNOSIS: Unintentional weight loss related to sub-optimal intake as evidenced by pt report.   Goal: Pt to meet >/= 90% of their estimated nutrition needs.  Monitor:  PO intake  Assessment:  Pt admitted for threats to his wife. Per notes, pt has been sober from alcohol for 5.5 months and has been going to AA. Per review, pt reported decreased appetite for 6 months prior to sobriety d/t drinking.   No recent weight hx available for comparison. Will order Ensure Enlive once/day PRN. Continue to encourage PO intakes of meals and snacks.  55 y.o. male  Height: Ht Readings from Last 1 Encounters:  01/25/16 5' 8.9" (1.75 m)    Weight: Wt Readings from Last 1 Encounters:  01/25/16 179 lb (81.2 kg)    Weight Hx: Wt Readings from Last 10 Encounters:  01/25/16 179 lb (81.2 kg)  01/24/16 185 lb (83.9 kg)  05/21/14 202 lb (91.6 kg)  05/19/14 200 lb (90.7 kg)  01/08/10 196 lb (88.9 kg)  10/12/09 193 lb (87.5 kg)  09/26/09 194 lb (88 kg)  03/26/09 196 lb (88.9 kg)  09/28/08 193 lb 12.8 oz (87.9 kg)  08/23/08 190 lb (86.2 kg)    BMI:  Body mass index is 26.51 kg/m. Pt meets criteria for overweight based on current BMI.  Estimated Nutritional Needs: Kcal: 25-30 kcal/kg Protein: > 1 gram protein/kg Fluid: 1 ml/kcal  Diet Order: Diet regular Room service appropriate? Yes; Fluid consistency: Thin Pt is also offered choice of unit snacks mid-morning and mid-afternoon.  Pt is eating as desired.   Lab results and medications reviewed.     Trenton GammonJessica Danil Wedge, MS, RD, LDN Inpatient Clinical Dietitian Pager # [phone] After hours/weekend  pager # [phone]

## 2016-01-25 NOTE — Tx Team (Signed)
Interdisciplinary Treatment and Diagnostic Plan Update  01/25/2016 Time of Session: 1:20 PM  Jared [name] MRN: [id number]  Principal Diagnosis: MDD IED Secondary Diagnoses: Active Problems:   Major depressive disorder (HCC)   Current Medications:  Current Facility-Administered Medications  Medication Dose Route Frequency Provider Last Rate Last Dose  . acetaminophen (TYLENOL) tablet 650 mg  650 mg Oral Q6H PRN Rozetta Nunnery, NP      . alum & mag hydroxide-simeth (MAALOX/MYLANTA) 200-200-20 MG/5ML suspension 30 mL  30 mL Oral Q4H PRN Rozetta Nunnery, NP      . feeding supplement (ENSURE ENLIVE) (ENSURE ENLIVE) liquid 237 mL  237 mL Oral Daily PRN Jenne Campus, MD      . gabapentin (NEURONTIN) tablet 600 mg  600 mg Oral BID Sueanne Margarita, MD      . hydrOXYzine (ATARAX/VISTARIL) tablet 25 mg  25 mg Oral TID PRN Rozetta Nunnery, NP   25 mg at 01/25/16 1230  . [START ON 01/26/2016] Influenza vac split quadrivalent PF (FLUARIX) injection 0.5 mL  0.5 mL Intramuscular Tomorrow-1000 Fernando A Cobos, MD      . magnesium hydroxide (MILK OF MAGNESIA) suspension 30 mL  30 mL Oral Daily PRN Rozetta Nunnery, NP      . traZODone (DESYREL) tablet 50 mg  50 mg Oral QHS,MR X 1 Rozetta Nunnery, NP        PTA Medications: Prescriptions Prior to Admission  Medication Sig Dispense Refill Last Dose  . amLODipine-olmesartan (AZOR) 5-20 MG per tablet Take 1 tablet by mouth 2 (two) times daily. For high blood pressure 60 tablet 0 01/23/2016 at Unknown time  . aspirin 81 MG tablet Take 162 mg by mouth daily.    Past Week at Unknown time  . gabapentin (NEURONTIN) 300 MG capsule Take 1 capsule (300 mg total) by mouth 3 (three) times daily. For agitation 90 capsule 0 Past Week at Unknown time  . sertraline (ZOLOFT) 50 MG tablet Take 50 mg by mouth daily.   01/23/2016 at Unknown time  . zolpidem (AMBIEN) 5 MG tablet Take 1 tablet (5 mg total) by mouth at bedtime as needed for sleep. (Patient not taking: Reported on  06/19/2014) 30 tablet 0 Not Taking    Treatment Modalities: Medication Management, Group therapy, Case management,  1 to 1 session with clinician, Psychoeducation, Recreational therapy.  Patient Stressors: Financial difficulties Marital or family conflict Occupational concerns  Patient Strengths: Ability for insight Active sense of humor Average or above average intelligence Capable of independent living General fund of knowledge Motivation for treatment/growth Supportive family/friends Work Artist for Primary Diagnosis: MDD IED Long Term Goal(s): Improvement in symptoms so as ready for discharge  Short Term Goals: Ability to identify changes in lifestyle to reduce recurrence of condition will improve, Ability to verbalize feelings will improve, Ability to disclose and discuss suicidal ideas, Ability to demonstrate self-control will improve, Ability to identify and develop effective coping behaviors will improve, Ability to maintain clinical measurements within normal limits will improve, Compliance with prescribed medications will improve and Ability to identify triggers associated with substance abuse/mental health issues will improve  Medication Management: Evaluate patient's response, side effects, and tolerance of medication regimen.  Therapeutic Interventions: 1 to 1 sessions, Unit Group sessions and Medication administration.  Evaluation of Outcomes: Not Met  Physician Treatment Plan for Secondary Diagnosis: Active Problems:   Major depressive disorder ([place])   Long Term Goal(s): Improvement in symptoms so as ready  for discharge  Short Term Goals: Ability to identify changes in lifestyle to reduce recurrence of condition will improve, Ability to verbalize feelings will improve, Ability to disclose and discuss suicidal ideas, Ability to demonstrate self-control will improve, Ability to identify and develop effective coping behaviors will improve,  Ability to maintain clinical measurements within normal limits will improve, Compliance with prescribed medications will improve and Ability to identify triggers associated with substance abuse/mental health issues will improve  Medication Management: Evaluate patient's response, side effects, and tolerance of medication regimen.  Therapeutic Interventions: 1 to 1 sessions, Unit Group sessions and Medication administration.  Evaluation of Outcomes: Met   RN Treatment Plan for Primary Diagnosis: MDD IED Long Term Goal(s): Knowledge of disease and therapeutic regimen to maintain health will improve  Short Term Goals: Ability to verbalize frustration and anger appropriately will improve, Ability to verbalize feelings will improve and Ability to identify and develop effective coping behaviors will improve  Medication Management: RN will administer medications as ordered by provider, will assess and evaluate patient's response and provide education to patient for prescribed medication. RN will report any adverse and/or side effects to prescribing provider.  Therapeutic Interventions: 1 on 1 counseling sessions, Psychoeducation, Medication administration, Evaluate responses to treatment, Monitor vital signs and CBGs as ordered, Perform/monitor CIWA, COWS, AIMS and Fall Risk screenings as ordered, Perform wound care treatments as ordered.  Evaluation of Outcomes: Not Met   LCSW Treatment Plan for Primary Diagnosis: MDD IED Long Term Goal(s): Safe transition to appropriate next level of care at discharge, Engage patient in therapeutic group addressing interpersonal concerns.  Short Term Goals: Engage patient in aftercare planning with referrals and resources, Increase ability to appropriately verbalize feelings, Facilitate acceptance of mental health diagnosis and concerns and Increase skills for wellness and recovery  Therapeutic Interventions: Assess for all discharge needs, 1 to 1 time with  Social worker, Explore available resources and support systems, Assess for adequacy in community support network, Educate family and significant other(s) on suicide prevention, Complete Psychosocial Assessment, Interpersonal group therapy.  Evaluation of Outcomes: Not Met   Progress in Treatment: Attending groups: Pt is new to milieu, continuing to assess  Participating in groups: Pt is new to milieu, continuing to assess  Taking medication as prescribed: Yes, MD continues to assess for medication changes as needed Toleration medication: Yes, no side effects reported at this time Family/Significant other contact made: No, CSW assessing for appropriate contact Patient understands diagnosis: Continuing to assess Discussing patient identified problems/goals with staff: Yes Medical problems stabilized or resolved: Yes Denies suicidal/homicidal ideation: Yes Issues/concerns per patient self-inventory: None Other: N/A  New problem(s) identified: None identified at this time.   New Short Term/Long Term Goal(s): None identified at this time.   Discharge Plan or Barriers: Pt will return home and follow-up with outpatient services.   Reason for Continuation of Hospitalization: Depression Medication stabilization Suicidal ideation  Estimated Length of Stay: 3-5 days  Attendees: Patient: 01/25/2016  1:20 PM  Physician: Dr. Ananias Pilgrim 01/25/2016  1:20 PM  Nursing: Sandre Kitty, RN 01/25/2016  1:20 PM  RN Care Manager: Lars Pinks, RN 01/25/2016  1:20 PM  Social Worker: Peri Maris, LCSW; Kristin Drinkard, LCSW 01/25/2016  1:20 PM  Recreational Therapist:  01/25/2016  1:20 PM  Other: Lindell Spar, NP;  01/25/2016  1:20 PM  Other:  01/25/2016  1:20 PM  Other: 01/25/2016  1:20 PM    Scribe for Treatment Team: Bo Mcclintock, LCSW 01/25/2016 1:20 PM

## 2016-01-25 NOTE — Progress Notes (Signed)
Admission Note:   [age] yr male who presents IVC petitioned by wife accompanied by GDP in no acute distress for the treatment of SI and Depression. Pt appears angry, irritable, and depressed. Pt was calm and cooperative with admission process. Per report, Pt. was sending texts to his wife stating he wanted to kill himself and burn down the house. He sent pictures filling up gasoline cans and a video of himself placing them around the house. Pt. states "my wife ignores me and gives me the silent treatment" and "I get blame for everything". Pt. Identified family and financial concerns as his major stressors. 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 Per assessment, Pt. express conflict with step daughters and wife. Pt. is currently 6 months sober and attends AA meetings regularly. Pt. denies SI/HI/AVH/Pain at this time. Pt has Past medical Hx of HTN, GERD, alcohol abuse,heart murmur, and PUD. Pt. states he has a family medical history of alcohol use and substance abuse. Skin was assessed and found to be clear of any abnormal marks apart from a bruise from previous IV site to L. forearm. PT searched and no contraband found, POC and unit policies explained and understanding verbalized. Consents obtained. Food and fluids offered, and both accepted. Pt had no additional questions or concerns.
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
## 2016-01-25 NOTE — [facility] Group Notes (Signed)
[facility] LCSW Group Therapy 01/25/2016 1:15pm  Type of Therapy: Group Therapy- Feelings Around Relapse and Recovery  Participation Level: Active   Participation Quality:  Appropriate  Affect:  Appropriate  Cognitive: Alert and Oriented   Insight:  Developing   Engagement in Therapy: Developing/Improving and Engaged   Modes of Intervention: Clarification, Confrontation, Discussion, Education, Exploration, Limit-setting, Orientation, Problem-solving, Rapport Building, Dance movement psychotherapisteality Testing, Socialization and Support  Summary of Progress/Problems: The topic for today was feelings about relapse. The group discussed what relapse prevention is to them and identified triggers that they are on the path to relapse. Members also processed their feeling towards relapse and were able to relate to common experiences. Group also discussed coping skills that can be used for relapse prevention.  Pt discussed triggers including feeling like his opinion is not valued and the toxic relationship with his wife. Pt was able to relate with other peers.    Therapeutic Modalities:   Cognitive Behavioral Therapy Solution-Focused Therapy Assertiveness Training Relapse Prevention Therapy    Jared SprinklesLauren Carter, LCSWA [phone] 01/25/2016 4:49 PM

## 2016-01-25 NOTE — [facility] Suicide Risk Assessment (Signed)
[facility]   Nursing information obtained from:    Demographic factors:    Current Mental Status:    Loss Factors:    Historical Factors:    Risk Reduction Factors:     Total Time spent with patient: 1.5 hours Principal Problem: <principal problem not specified> Diagnosis:   Patient Active Problem List   Diagnosis Date Noted  . Major depressive disorder ([facility]) [F32.9] 01/25/2016  . Intermittent explosive disorder [F63.81] 01/24/2016  . Major depressive disorder, single episode, severe without psychotic features ([facility]) [F32.2]   . Alcohol abuse [F10.10] 05/20/2014  . Depression [F32.9] 05/20/2014  . Suicide attempt ([facility]) [T14.91] 05/20/2014  . Suicidal ideations [R45.851]   . INGUINAL HERNIA [K40.90] 10/12/2009  . HEMORRHOIDS, WITH BLEEDING [K64.9] 09/28/2008  . HYPERLIPIDEMIA [E78.5] 08/23/2008  . ERECTILE DYSFUNCTION [F52.8] 07/26/2008  . HYPERTENSION [I10] 07/26/2008  . ALLERGIC RHINITIS [J30.9] 07/26/2008  . GERD [K21.9] 07/26/2008  . PEPTIC ULCER DISEASE [K27.9] 07/26/2008  . NONSPECIFIC ABNORMAL ELECTROCARDIOGRAM [R94.31] 07/26/2008   Subjective Data: SEE ADMISSION ASSESSMENT  Continued Clinical Symptoms:  Alcohol Use Disorder Identification Test Final Score (AUDIT): 0 The "Alcohol Use Disorders Identification Test", Guidelines for Use in Primary Care, Second Edition.  World Science writerHealth Organization [facility](WHO). Score between 0-7:  no or low risk or alcohol related problems. Score between 8-15:  moderate risk of alcohol related problems. Score between 16-19:  high risk of alcohol related problems. Score 20 or above:  warrants further diagnostic evaluation for alcohol dependence and treatment.   CLINICAL FACTORS:   Depression:   Impulsivity   Musculoskeletal: SEE ADMISSION ASSESSMENT   Psychiatric Specialty Exam:see admission assessment Physical Exam  Constitutional: He appears well-developed and well-nourished.  HENT:  Head: Normocephalic and  atraumatic.  Eyes: Conjunctivae and EOM are normal. Pupils are equal, round, and reactive to light.  Neck: Normal range of motion. Neck supple.  Psychiatric: He expresses impulsivity.    ROS  Blood pressure 109/76, pulse 83, temperature 98.6 F (37 C), temperature source Oral, resp. rate 18, height 5' 8.9" (1.75 m), weight 81.2 kg (179 lb), SpO2 100 %.Body mass index is 26.51 kg/m.      COGNITIVE FEATURES THAT CONTRIBUTE TO RISK:  None    SUICIDE RISK:   Moderate:  Frequent suicidal ideation with limited intensity, and duration, some specificity in terms of plans, no associated intent, good self-control, limited dysphoria/symptomatology, some risk factors present, and identifiable protective factors, including available and accessible social support.   PLAN OF CARE: see admission assessment  I certify that inpatient services furnished can reasonably be expected to improve the patient's condition.  Wynelle BourgeoisUreh N Lekauwa, MD 01/25/2016, 9:06 AM

## 2016-01-25 NOTE — Progress Notes (Signed)
D.  Pt nervous on approach, upset, states wife didn't visit and he is tired of her putting him last.  Pt states he asked secretary to take his wife's name off of his visitation list.  Pt ruminated on this for rest of evening.   Pt did attend evening wrap up group, minimal interaction this evening on unit.  Pt endorses passive SI at times, contracts for safety on the unit.  A.  Support and encouragement offered, medication given as ordered.  R.  Pt came up around 10 minutes after receiving Trazodone 50 mg and states he sees spots before his eyes when he closes them.  Asked if medication will wear off, does not want to take it again.  Reassured Pt, will continue to monitor.

## 2016-01-25 NOTE — Plan of Care (Signed)
Problem: Safety: Goal: Periods of time without injury will increase Outcome: Progressing Pt. remains a low fall risk, denies SI/HI at this time, Q 15 checks in place.    
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
## 2016-01-26 NOTE — Progress Notes (Signed)
Adult Psychoeducational Group Note  Date:  01/26/2016 Time:  12:09 AM  Group Topic/Focus:  Wrap-Up Group:   The focus of this group is to help patients review their daily goal of treatment and discuss progress on daily workbooks.   Participation Level:  Active  Participation Quality:  Appropriate  Affect:  Appropriate  Cognitive:  Appropriate  Insight: Appropriate  Engagement in Group:  Engaged  Modes of Intervention:  Education  Additional Comments:  Pt stated that goal was to get through the day and to speak to wife. Pt stated that today was "awful and it sucked." Pt stated that he had argued with wife. After group, writer spoke with pt about pts current situation. Writer encouraged pt to shift negative thoughts into a positive direction. Pt was advised to allow time to "cool off" before making any impulsive decisions with wife. Chevette Fee, Alfredia ClientAndreia 01/26/2016, 12:09 AM

## 2016-01-26 NOTE — [facility] Group Notes (Signed)
[facility] Group Notes:  (Nursing/MHT/Case Management/Adjunct)  Date:  01/26/2016  Time:  11:03 AM  Type of Therapy:  Psychoeducational Skills  Participation Level:  Active  Participation Quality:  Appropriate  Affect:  Appropriate  Cognitive:  Appropriate  Insight:  Appropriate  Engagement in Group:  Engaged  Modes of Intervention:  Discussion  Summary of Progress/Problems: Pt did attend self inventory group.     Jacquelyne BalintForrest, Kaveh Kissinger Shanta 01/26/2016, 11:03 AM

## 2016-01-26 NOTE — Progress Notes (Signed)
D Jared [name] has spent a lot of time processing today with this Clinical research associatewriter...he has attended his groups..he has asked appropriate questions , has documented his feelings and also is currently writing a letter to his wife (telling her his feelings of hurt that she hasnt visited him in the hospital as of yet).  A He completed his daily asessment and on it he wrote he deneid SI today and he rated his depression, hopelesness and anxiety " 0/0/2", respectively. R Pt has reveived 2 prn doses of po vistaril and he has stated reilef from anxiety after taking vistaril. Safety in place.
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
## 2016-01-26 NOTE — Progress Notes (Signed)
Adult Psychoeducational Group Note  Date:  01/26/2016 Time:  10:26 PM  Group Topic/Focus:  Wrap-Up Group:   The focus of this group is to help patients review their daily goal of treatment and discuss progress on daily workbooks.   Participation Level:  Active  Participation Quality:  Appropriate  Affect:  Appropriate  Cognitive:  Alert, Appropriate and Oriented  Insight: Appropriate  Engagement in Group:  Engaged  Modes of Intervention:  Discussion  Additional Comments: Patient said that his day was a 8.  His goal for today was to not to call his wife and he did not. His coping skills was talking to his Psychiatrist.   Jared DandyDelicia W Anjelina [name] 01/26/2016, 10:26 PM

## 2016-01-26 NOTE — [facility] Group Notes (Signed)
[facility] Group Notes:  (Nursing/MHT/Case Management/Adjunct)  Date:  01/26/2016  Time:  2:38 PM  Type of Therapy:  Psychoeducational Skills  Participation Level:  Active  Participation Quality:  Appropriate  Affect:  Appropriate  Cognitive:  Appropriate  Insight:  Appropriate  Engagement in Group:  Engaged  Modes of Intervention:  Discussion  Summary of Progress/Problems: Pt did attend life skills group.     Jared [name] 01/26/2016, 2:38 PM 

## 2016-01-26 NOTE — [facility] Group Notes (Signed)
Adult Therapy Group Note  Date: 01/26/2016 Time:  10:00-11:00AM  Group Topic/Focus: Unhealthy Coping Skills versus Healthy Coping Skills  Building Self Esteem:   The Focus of this group was to assist patients in becoming aware of the differences between healthy and unhealthy coping techniques, as well as how to determine which type they are using.   Reasons for choosing the unhealthy techniques were explored, which helped patients to provide support to each other and to determine that, in fact, they are not alone.  Participation Level:  Active  Participation Quality:  Attentive, Sharing and Supportive  Affect:  Depressed  Cognitive:  Appropriate and Oriented  Insight: Good  Engagement in Group:  Engaged  Modes of Intervention:  Discussion, Exploration and Support  Additional Comments:  The patient expressed that alcohol has been an unhealthy coping technique, although he has been sober for 6 months through attendance at [employer] meetings, and the use of a sponsor and grand-sponsor.  He was able to talk about an incident in the morning with his nurse where he changed his method of doing things, and decided instead of assuming the worst, went back to talk to his nurse, getting confirmation of what in fact was meant in their conversation, and receiving appropriate support instead of the typical outcome of anger.  Carloyn JaegerMareida J Grossman-Orr 01/26/2016, 12:35 PM

## 2016-01-27 MED ORDER — ARIPIPRAZOLE 5 MG PO TABS
5.0000 mg | ORAL_TABLET | Freq: Every day | ORAL | Status: DC
  Administered 2016-01-27: 5 mg via ORAL
  Filled 2016-01-27 (×3): qty 1

## 2016-01-27 NOTE — Progress Notes (Signed)
Wrier spoke with patient 1:1 and he talked about the reason he is here is because of his wife. He is in denial and wants to blame her for and is somewhat still upset with her. He reports that she doesn't pay any attention to him and constantly does thing that make him upset. Writer asked if they had received counseling and he reported that they had but it didn't work. He reports that he planst to start back seeing a therapist. Support given and safety maintained on unit with 15 min checks.
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
## 2016-01-27 NOTE — Progress Notes (Signed)
Adult Psychoeducational Group Note  Date:  01/27/2016 Time:  10:53 PM  Group Topic/Focus:  Wrap-Up Group:   The focus of this group is to help patients review their daily goal of treatment and discuss progress on daily workbooks.   Participation Level:  Active  Participation Quality:  Appropriate  Affect:  Appropriate  Cognitive:  Alert, Appropriate and Oriented  Insight: Appropriate  Engagement in Group:  Engaged  Modes of Intervention:  Discussion  Additional Comments:  Patient attended group and said that his day was a 5. His goal was not to call his wife and he did not .  His coping skill was talking to his nurse.  Gustavia Carie W Elisabeth Strom 01/27/2016, 10:53 PM

## 2016-01-27 NOTE — [facility] Group Notes (Signed)
Adult Therapy Group Note  Date: 01/27/2016 Time:  10:00-11:00AM  Group Topic/Focus: Healthy Event organiserupport Systems  Building Self Esteem:   The focus of this group was to assist patients in identifying current healthy supports, as well as how to widen their support systems.  Examples given by various patients were used to emphasize the importance of expanding supports, with an emphasis on the use of AA/NA, problem-specific support groups, doctors, counselors, and self.  Most patients also chose to share the reasons for their current hospitalization, and received much encouragement and support from their fellow patients as they did this.   Participation Level:  Active  Participation Quality:  Attentive and Sharing  Affect:  Blunted  Cognitive:  Appropriate  Insight: Improving  Engagement in Group:  Engaged  Modes of Intervention:  Discussion, Exploration and Support  Additional Comments:  The patient expressed that healthy supports currently active include "maybe" his wife and her family, his sponsor, IT sales professionalgrandsponsor, and [employer] meetings.  He provided much support for other group members and presented himself as being less dysfunctional than he likely is, as long as he was talking in the group setting, but revealed the true depths of his feelings in private session later.  Lynnell ChadMareida J Grossman-Orr, LCSW 01/27/2016  4:15PM
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
## 2016-01-27 NOTE — Progress Notes (Signed)
Patient ID: Jared [name], male   DOB: 08/25/1960, 55 y.o.   MRN: [id number] D:Affect is sad/anxious ,mood is depressed. States that he is working on coming to some clarity on what his wife has said to him.A:Support and encouragement offered. R:Receptive. No complaints of pain at this time.

## 2016-01-28 ENCOUNTER — Inpatient Hospital Stay (HOSPITAL_COMMUNITY): Payer: BC Managed Care – PPO

## 2016-01-28 ENCOUNTER — Other Ambulatory Visit: Payer: Self-pay

## 2016-01-28 ENCOUNTER — Inpatient Hospital Stay (HOSPITAL_COMMUNITY)
Admission: AD | Admit: 2016-01-28 | Discharge: 2016-01-31 | DRG: 309 | Disposition: A | Discharge status: Home / Self Care | Payer: BC Managed Care – PPO | Source: Other Acute Inpatient Hospital | Attending: Internal Medicine | Admitting: Internal Medicine

## 2016-01-28 ENCOUNTER — Encounter (HOSPITAL_COMMUNITY): Payer: Self-pay | Admitting: *Deleted

## 2016-01-28 DIAGNOSIS — E7800 Pure hypercholesterolemia, unspecified: Secondary | ICD-10-CM | POA: Diagnosis not present

## 2016-01-28 DIAGNOSIS — R45851 Suicidal ideations: Secondary | ICD-10-CM

## 2016-01-28 DIAGNOSIS — F6381 Intermittent explosive disorder: Secondary | ICD-10-CM | POA: Diagnosis present

## 2016-01-28 DIAGNOSIS — I110 Hypertensive heart disease with heart failure: Secondary | ICD-10-CM | POA: Diagnosis present

## 2016-01-28 DIAGNOSIS — I10 Essential (primary) hypertension: Secondary | ICD-10-CM | POA: Diagnosis not present

## 2016-01-28 DIAGNOSIS — Z7982 Long term (current) use of aspirin: Secondary | ICD-10-CM | POA: Diagnosis not present

## 2016-01-28 DIAGNOSIS — I4891 Unspecified atrial fibrillation: Secondary | ICD-10-CM | POA: Diagnosis not present

## 2016-01-28 DIAGNOSIS — Z87891 Personal history of nicotine dependence: Secondary | ICD-10-CM

## 2016-01-28 DIAGNOSIS — I480 Paroxysmal atrial fibrillation: Principal | ICD-10-CM | POA: Diagnosis present

## 2016-01-28 DIAGNOSIS — E785 Hyperlipidemia, unspecified: Secondary | ICD-10-CM

## 2016-01-28 DIAGNOSIS — F322 Major depressive disorder, single episode, severe without psychotic features: Secondary | ICD-10-CM | POA: Diagnosis not present

## 2016-01-28 DIAGNOSIS — I5032 Chronic diastolic (congestive) heart failure: Secondary | ICD-10-CM | POA: Diagnosis present

## 2016-01-28 DIAGNOSIS — F332 Major depressive disorder, recurrent severe without psychotic features: Secondary | ICD-10-CM | POA: Diagnosis not present

## 2016-01-28 DIAGNOSIS — Z8249 Family history of ischemic heart disease and other diseases of the circulatory system: Secondary | ICD-10-CM

## 2016-01-28 DIAGNOSIS — Z823 Family history of stroke: Secondary | ICD-10-CM

## 2016-01-28 DIAGNOSIS — F1021 Alcohol dependence, in remission: Secondary | ICD-10-CM | POA: Diagnosis present

## 2016-01-28 DIAGNOSIS — E782 Mixed hyperlipidemia: Secondary | ICD-10-CM | POA: Diagnosis not present

## 2016-01-28 DIAGNOSIS — F333 Major depressive disorder, recurrent, severe with psychotic symptoms: Secondary | ICD-10-CM | POA: Diagnosis not present

## 2016-01-28 DIAGNOSIS — F339 Major depressive disorder, recurrent, unspecified: Secondary | ICD-10-CM | POA: Diagnosis not present

## 2016-01-28 DIAGNOSIS — Z79899 Other long term (current) drug therapy: Secondary | ICD-10-CM

## 2016-01-28 DIAGNOSIS — F1099 Alcohol use, unspecified with unspecified alcohol-induced disorder: Secondary | ICD-10-CM

## 2016-01-28 LAB — LIPID PANEL
CHOL/HDL RATIO: 4.1 ratio
Cholesterol: 154 mg/dL (ref 0–200)
HDL: 38 mg/dL — AB (ref >40)
LDL Cholesterol: 76 mg/dL (ref 0–99)
TRIGLYCERIDES: 198 mg/dL — AB (ref <150)
VLDL: 40 mg/dL (ref 0–40)

## 2016-01-28 LAB — CBC
HEMATOCRIT: 40.9 % (ref 39.0–52.0)
HEMOGLOBIN: 13.9 g/dL (ref 13.0–17.0)
MCH: 31.4 pg (ref 26.0–34.0)
MCHC: 34 g/dL (ref 30.0–36.0)
MCV: 92.3 fL (ref 78.0–100.0)
Platelets: 217 10*3/uL (ref 150–400)
RBC: 4.43 MIL/uL (ref 4.22–5.81)
RDW: 12.5 % (ref 11.5–15.5)
WBC: 6.6 10*3/uL (ref 4.0–10.5)

## 2016-01-28 LAB — MAGNESIUM: Magnesium: 1.7 mg/dL (ref 1.7–2.4)

## 2016-01-28 LAB — PHOSPHORUS: PHOSPHORUS: 2.3 mg/dL — AB (ref 2.5–4.6)

## 2016-01-28 LAB — COMPREHENSIVE METABOLIC PANEL
ALBUMIN: 3.8 g/dL (ref 3.5–5.0)
ALT: 30 U/L (ref 17–63)
ANION GAP: 6 (ref 5–15)
AST: 21 U/L (ref 15–41)
Alkaline Phosphatase: 71 U/L (ref 38–126)
BILIRUBIN TOTAL: 0.7 mg/dL (ref 0.3–1.2)
BUN: 6 mg/dL (ref 6–20)
CHLORIDE: 113 mmol/L — AB (ref 101–111)
CO2: 22 mmol/L (ref 22–32)
Calcium: 8.9 mg/dL (ref 8.9–10.3)
Creatinine, Ser: 0.76 mg/dL (ref 0.61–1.24)
GFR calc Af Amer: >60 mL/min (ref >60)
GFR calc non Af Amer: >60 mL/min (ref >60)
GLUCOSE: 118 mg/dL — AB (ref 65–99)
POTASSIUM: 4 mmol/L (ref 3.5–5.1)
SODIUM: 141 mmol/L (ref 135–145)
TOTAL PROTEIN: 6 g/dL — AB (ref 6.5–8.1)

## 2016-01-28 LAB — APTT: APTT: 26 s (ref 24–36)

## 2016-01-28 LAB — PROTIME-INR
INR: 1
Prothrombin Time: 13.2 seconds (ref 11.4–15.2)

## 2016-01-28 LAB — TSH: TSH: 1.828 u[IU]/mL (ref 0.350–4.500)

## 2016-01-28 LAB — T4, FREE: FREE T4: 0.89 ng/dL (ref 0.61–1.12)

## 2016-01-28 LAB — MRSA PCR SCREENING: MRSA by PCR: NEGATIVE

## 2016-01-28 MED ORDER — DILTIAZEM LOAD VIA INFUSION
20.0000 mg | Freq: Once | INTRAVENOUS | Status: AC
  Administered 2016-01-28: 20 mg via INTRAVENOUS
  Filled 2016-01-28: qty 20

## 2016-01-28 MED ORDER — HYDROXYZINE HCL 25 MG PO TABS: 25.0000 mg | ORAL_TABLET | Freq: Three times a day (TID) | ORAL | Status: DC | PRN

## 2016-01-28 MED ORDER — HEPARIN BOLUS VIA INFUSION
4000.0000 [IU] | Freq: Once | INTRAVENOUS | Status: AC
  Administered 2016-01-28: 4000 [IU] via INTRAVENOUS
  Filled 2016-01-28: qty 4000

## 2016-01-28 MED ORDER — HEPARIN (PORCINE) IN NACL 100-0.45 UNIT/ML-% IJ SOLN: 1200.0000 [IU]/h | INTRAMUSCULAR | Status: DC

## 2016-01-28 MED ORDER — HEPARIN BOLUS VIA INFUSION: 4000.0000 [IU] | Freq: Once | INTRAVENOUS | Status: DC

## 2016-01-28 MED ORDER — SODIUM CHLORIDE 0.9 % IV BOLUS (SEPSIS)
1000.0000 mL | Freq: Once | INTRAVENOUS | Status: AC
  Administered 2016-01-28: 1000 mL via INTRAVENOUS

## 2016-01-28 MED ORDER — MAGNESIUM SULFATE 2 GM/50ML IV SOLN
2.0000 g | Freq: Once | INTRAVENOUS | Status: AC
Start: 2016-01-28 — End: 2016-01-28
  Administered 2016-01-28: 2 g via INTRAVENOUS
  Filled 2016-01-28: qty 50

## 2016-01-28 MED ORDER — DILTIAZEM HCL-DEXTROSE 100-5 MG/100ML-% IV SOLN (PREMIX)
5.0000 mg/h | INTRAVENOUS | Status: DC
  Administered 2016-01-28: 5 mg/h via INTRAVENOUS
  Filled 2016-01-28: qty 100

## 2016-01-28 MED ORDER — GABAPENTIN 300 MG PO CAPS
600.0000 mg | ORAL_CAPSULE | Freq: Once | ORAL | Status: AC
  Administered 2016-01-28: 600 mg via ORAL
  Filled 2016-01-28: qty 2

## 2016-01-28 MED ORDER — ATORVASTATIN CALCIUM 10 MG PO TABS: 20.0000 mg | ORAL_TABLET | Freq: Every day | ORAL | Status: DC

## 2016-01-28 MED ORDER — SODIUM CHLORIDE 0.9% FLUSH
3.0000 mL | Freq: Two times a day (BID) | INTRAVENOUS | Status: DC
  Administered 2016-01-29: 3 mL via INTRAVENOUS
  Administered 2016-01-30: 6 mL via INTRAVENOUS
  Administered 2016-01-31: 3 mL via INTRAVENOUS

## 2016-01-28 MED ORDER — OFF THE BEAT BOOK
Freq: Once | Status: AC
  Administered 2016-01-28: 1
  Filled 2016-01-28: qty 1

## 2016-01-28 MED ORDER — ARIPIPRAZOLE 5 MG PO TABS: 5.0000 mg | ORAL_TABLET | Freq: Every day | ORAL | Status: DC

## 2016-01-28 MED ORDER — ZOLPIDEM TARTRATE 5 MG PO TABS: 5.0000 mg | ORAL_TABLET | Freq: Every evening | ORAL | Status: DC | PRN

## 2016-01-28 MED ORDER — GABAPENTIN 300 MG PO CAPS
600.0000 mg | ORAL_CAPSULE | Freq: Two times a day (BID) | ORAL | Status: DC
  Administered 2016-01-28 – 2016-01-31 (×6): 600 mg via ORAL
  Filled 2016-01-28 (×6): qty 2

## 2016-01-28 MED ORDER — HEPARIN (PORCINE) IN NACL 100-0.45 UNIT/ML-% IJ SOLN
1200.0000 [IU]/h | INTRAMUSCULAR | Status: DC
  Administered 2016-01-28 – 2016-01-29 (×2): 1200 [IU]/h via INTRAVENOUS
  Filled 2016-01-28 (×2): qty 250

## 2016-01-28 MED ORDER — ARIPIPRAZOLE 5 MG PO TABS
5.0000 mg | ORAL_TABLET | Freq: Every day | ORAL | Status: DC
  Filled 2016-01-28: qty 1

## 2016-01-28 MED ORDER — GABAPENTIN 600 MG PO TABS: 600.0000 mg | ORAL_TABLET | Freq: Two times a day (BID) | ORAL | Status: DC

## 2016-01-28 NOTE — ED Notes (Signed)
MD at bedside. Cardiology 

## 2016-01-28 NOTE — Progress Notes (Signed)
**  Copy of note written earlier from previous encounter**    ANTICOAGULATION CONSULT NOTE - Initial Consult  Pharmacy Consult for heparin Indication: atrial fibrillation  No Known Allergies  Patient Measurements:   Heparin Dosing Weight: 83.9kg  Vital Signs: Temp: 98.1 F (36.7 C) (10/02 1700) Temp Source: Oral (10/02 1700) BP: 133/71 (10/02 1700) Pulse Rate: 61 (10/02 1700)  Labs:  Recent Labs  01/28/16 1105 01/28/16 1426  HGB  --  13.9  HCT  --  40.9  PLT  --  217  APTT 26  --   LABPROT 13.2  --   INR 1.00  --   CREATININE  --  0.76    Estimated Creatinine Clearance: 107.7 mL/min (by C-G formula based on SCr of 0.76 mg/dL).   Assessment: [age] [facility] and lightheadeness. EKG showed AFib and [facility]. History of PVCs, but no known AFib, MIs or CAD.  Hgb 13.2, plts 217- no bleeding noted. He is not on anticoagulation PTA.  Goal of Therapy:  Heparin level 0.3-0.7 units/ml Monitor platelets by anticoagulation protocol: Yes   Plan:  - start heparin with 4000 units IV x1, then start infusion at 1200units/hr - heparin level in 6h - daily HL and CBC - follow for cardiology workup and s/s bleeding  Rolena Knutson D. Taison Celani, PharmD, BCPS Clinical Pharmacist Pager: [phone] 01/28/2016 5:34 PM

## 2016-01-28 NOTE — ED Notes (Addendum)
Pt reports getting up to use the BR at 0330 this am and fell 3 times different place while walking to the BR and back to his bed.  Pt reports L upper lateral thigh, no bruising noted at this time.  Pt is A&O x4.  No hx of afib.  Denies any cp or dizziness at this time.  [facility]

## 2016-01-28 NOTE — Progress Notes (Addendum)
Patient reported to nurse 0830, and received per report, that patient says he has passed out several times on NOC shift.  BP stable (see flowsheets); however, irregular rhythm and rapid rate ausculated with stethoscope (110).  EKG received revealed Atrial Fibrillation RVR (verified with NP), while leads connected heart rate ranged between 100 & 130.  No history per chart or patient, patient stated occasional PVC's in past.  Press photographerCharge nurse, MD notified. [facility]  [facility], EMS called.  Patient's wife called and updated per patient/emergency contact list- patient notified.  Patient sent with an MHT due to IVC and SI.  [facility] and [facility].

## 2016-01-28 NOTE — Progress Notes (Signed)
Writer was notified by MHT on the 400 hall that patient was requesting to see Clinical research associatewriter. Writer went to his room and he reported that he felt dizzy and light headed. Vitals were taken sitting bp was 155/83 and pulse 66. He was encouraged to try and lie down and rest. He reported that he did not feel right and felt that it may be due to the abilify that he took on at hs. Writer encouraged him to speak with doctor about this particular medication and see if can be prescribed something else for his racing thoughts. He later reported to MHT that he was having PVC's, Clinical research associatewriter assessed patient and encouraged him to try a dose of vistaril for his anxiety and will reassess him shortly. Writer went back to check on him at 0530 and he was asleep. Safety maintained on unit with 15 min checks.

## 2016-01-28 NOTE — Progress Notes (Signed)
ANTICOAGULATION CONSULT NOTE - Initial Consult  Pharmacy Consult for heparin Indication: atrial fibrillation  No Known Allergies  Patient Measurements: Height: 5\' 10"  (177.8 cm) Weight: 185 lb (83.9 kg) IBW/kg (Calculated) : 73 Heparin Dosing Weight: 83.9kg  Vital Signs: Temp: 97.7 F (36.5 C) (10/02 1008) Temp Source: Oral (10/02 1008) BP: 135/93 (10/02 1545) Pulse Rate: 72 (10/02 1545)  Labs:  Recent Labs  01/28/16 1105 01/28/16 1426  HGB  --  13.9  HCT  --  40.9  PLT  --  217  APTT 26  --   LABPROT 13.2  --   INR 1.00  --   CREATININE  --  0.76    Estimated Creatinine Clearance: 107.7 mL/min (by C-G formula based on SCr of 0.76 mg/dL).   Assessment: [age] Y[facility] and lightheadeness. EKG showed AFib and [facility]. History of PVCs, but no known AFib, MIs or CAD.  Hgb 13.2, plts 217- no bleeding noted. He is not on anticoagulation PTA.  Goal of Therapy:  Heparin level 0.3-0.7 units/ml Monitor platelets by anticoagulation protocol: Yes   Plan:  - start heparin with 4000 units IV x1, then start infusion at 1200units/hr - heparin level in 6h - daily HL and CBC - follow for cardiology workup and s/s bleeding  Blessings Inglett D. Aija Scarfo, PharmD, BCPS Clinical Pharmacist Pager: [phone] 01/28/2016 4:45 PM

## 2016-01-28 NOTE — ED Notes (Signed)
[facility].  New sitter at bedside.

## 2016-01-28 NOTE — ED Notes (Signed)
Patient transported to CT 

## 2016-01-28 NOTE — ED Triage Notes (Signed)
Per EMS., [facility], had a syncopal episode last night, EKG showed A-fib RVR.  Pt is a&o X 4.  [facility] with SI.  Pt is calm and cooperative at this time.

## 2016-01-28 NOTE — H&P (Signed)
Date: 01/28/2016               Patient Name:  Jared [name] MRN: [id number]  DOB: 07/06/1960 Age / Sex: 55 y.o., male   PCP: No Pcp Per Patient         Medical Service: Internal Medicine Teaching Service         Attending Physician: Dr. Inez Catalina, MD    First Contact: Dr. Nelson Chimes Pager: [phone]  Second Contact: Dr. Lawerance Bach Pager: [phone]       After Hours (After 5p/  First Contact Pager: [phone]  weekends / holidays): Second Contact Pager: [phone]   Chief Complaint: syncope.  History of Present Illness: Jared [name] 55 y.o man with past medical history significant for severe depression with suicidal ideation, h/o alcohol abuse, HTN, HLD recently admitted to behavioral health on 01/25/16 after threatening that he will down his house along with himself and his dogs who presents to the ED after a syncopal episode.   Last night, patient got up to use the restroom and woke up laying next to the toilet. He got up and went back to his room and states he fell on his roommates leg. Patient then went to his bed and his roommate called someone to come check on him. At that time, staff told him he was likely still drowsy from his nighttime medications which includes trazodone and told him to "sleep it off." Patient then ambulated down the hall to lab and remembers sitting down holding his head in his hands. Patient admits to palpitations that he could feel in his chest and up into his throat. The nursing staff checked his vitals and an EKG who noted he was in atrial fibrillation.   Patient admits to diaphoresis, palpitations. Patient denies new shortness of breath, chest pain, constipation, recent illness, dysuria, increased frequency.   Meds:  No outpatient prescriptions have been marked as taking for the 01/25/16 encounter [facility] Encounter).   Allergies: Allergies as of 01/24/2016  . (No Known Allergies)   Past Medical History:  Diagnosis Date  . Heart murmur   . Hypertension      Family History: Mother- atrial fibrillation Father- CABG, CAD, MI  Social History: Tobacco Abuse: Denies Alcohol Abuse: History of alcohol abuse, quit 6 months ago, previously drank 4-8 glasses per night of red wine Illicit Drug Abuse: None  Review of Systems: A complete ROS was negative except as per HPI.   Physical Exam: Blood pressure 140/72, pulse 91, temperature 97.7 F (36.5 C), temperature source Oral, resp. rate 24, height 5\' 10"  (1.778 m), weight 185 lb (83.9 kg), SpO2 98 %.  Vitals:   01/28/16 1500 01/28/16 1515 01/28/16 1530 01/28/16 1545  BP: 131/83 132/86 116/96 135/93  Pulse: (!) 57 (!) 56 71 72  Resp: 18 13 15 18   Temp:      TempSrc:      SpO2: 99% 99% 97% 98%  Weight:      Height:       General: Vital signs reviewed.  Patient is well-developed and well-nourished, in no acute distress and cooperative with exam.  Head: Normocephalic and atraumatic. Eyes: EOMI, conjunctivae normal, no scleral icterus.  Neck: Supple, trachea midline, normal ROM, no JVD, masses, thyromegaly, or carotid bruit present.  Cardiovascular:Irregular rhythm with normal rate , no murmurs, gallops, or rubs. Pulmonary/Chest: Clear to auscultation bilaterally, no wheezes, rales, or rhonchi. Abdominal: Soft, non-tender, non-distended, BS +, no masses, organomegaly, or guarding present.  Musculoskeletal: No  joint deformities, erythema, or stiffness, ROM full and nontender. Extremities: No lower extremity edema bilaterally,  pulses symmetric and intact bilaterally. No cyanosis or clubbing. Neurological: A&O x3, Strength is normal and symmetric bilaterally, cranial nerve II-XII are grossly intact, no focal motor deficit, sensory intact to light touch bilaterally.  Skin: Warm, dry and intact. No rashes or erythema. Psychiatric: Normal mood and affect. speech and behavior is normal. Cognition and memory are normal.  Denies any current suicidal ideations.  Labs. CBC Latest Ref Rng & Units  01/28/2016 01/24/2016 05/19/2014  WBC 4.0 - 10.5 K/uL 6.6 8.4 5.2  Hemoglobin 13.0 - 17.0 g/dL 29.513.9 62.114.9 30.815.0  Hematocrit 39.0 - 52.0 % 40.9 41.2 42.3  Platelets 150 - 400 K/uL 217 233 245   Lipid Panel     Component Value Date/Time   CHOL 154 01/28/2016 0617   TRIG 198 (H) 01/28/2016 0617   HDL 38 (L) 01/28/2016 0617   CHOLHDL 4.1 01/28/2016 0617   VLDL 40 01/28/2016 0617   LDLCALC 76 01/28/2016 0617   LDLDIRECT 98.6 04/23/2010 0916   TSH: 1.828 T4 Free. 0.89 Mg. 1.7 Phosphorus: 2.3   EKG: Vent. rate 135 BPM PR interval * ms QRS duration 80 ms QT/QTc 268/402 ms P-R-T axes * 59 -82 Atrial fibrillation Ventricular premature complex Repol abnrm suggests ischemia, anterolateral Since last EKG, rate has increased Confirmed by ISAACS MD, CAMERON  CXR:  FINDINGS: The heart size and mediastinal contours are within normal limits. Both lungs are clear. The visualized skeletal structures are unremarkable.  IMPRESSION: No active disease.  CT head and spine without contrast. IMPRESSION: CT of the head:  No acute intracranial abnormality noted.  CT of the cervical spine: Multilevel degenerative change without acute abnormality.  Assessment & Plan by Problem: Principal Problem:   Atrial fibrillation with RVR (HCC) Active Problems:   HLD (hyperlipidemia)   Essential hypertension   GERD   Major depressive disorder  [name] 55 y.o man with past medical history significant for severe depression with suicidal ideation, HTN, HLD recently admitted to behavioral health on 01/25/16 after threatening that he will down his house along with himself and his dogs who presents to the ED after a syncopal episode.   New Onset Atrial Fibrillation with RVR: He has this new onset of A. fib with RVR. He was started on Cardizem infusion, his rate responded very well. His CHA2DS-VASc risk stratification score for stroke is 1 due to his history of hypertension. -Cardiology  consult. -Titrate Cardizem accordingly. -Echo.   HTN: He states that he was taking Zocor 10-40 at home. At behavioral health he was given amlodipine. He was normotensive on Cardizem drip. We will reassess his need for antihypertensives.  HLD: ASCVD risk factor of 0.31 and 14.83 %. -Start him on Lipitor 20 mg daily.  Major Depression with Suicidal Ideation: [facility]. At ED he states that he was never serious about suicidal ideation, he was doing all that to get his wife's attention. -Continue Abilify 5 mg at bedtime. -Hydroxyzine 25 mg when necessary.  DVT/PE ppx: Heparin FEN: Low sodium CODE:Full  Dispo: Admit patient to Inpatient with expected length of stay greater than 2 midnights.  Signed: Arnetha CourserSumayya Maja Mccaffery, MD 01/28/2016, 1:57 PM  Pager: [phone]

## 2016-01-28 NOTE — ED Notes (Signed)
Pt ambulated to the BR with sitter and his wife.  Pt reports feeling mild lightheadedness.

## 2016-01-28 NOTE — Progress Notes (Signed)
Patient has been up and active on the unit, attended group this evening and was compliant with his medications. Patient currently denies having pain, -si/hi/a/v hall. He reports that he takes his blood pressure medication 2 x daily.  Support and encouragement offered, safety maintained on unit, will continue to monitor.

## 2016-01-28 NOTE — Plan of Care (Signed)
Problem: Education: Goal: Knowledge of disease or condition will improve Outcome: Progressing Pt will receive education on A-Fib
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
## 2016-01-28 NOTE — ED Notes (Signed)
Family at bedside.Wife.
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
## 2016-01-28 NOTE — Progress Notes (Signed)
[facility] MD Progress Note  01/28/2016 1:23 PM Jared [name]  MRN:  [id number]  Subjective:  Patient reports he has had episodes of " falling out" this AM. He has not lost consciousness, but states he has felt he was going to fall or pass out .    Objective:  I have discussed case with treatment team and have met with patient . Patient is a 55 year old male, admitted under IVC due to making threats of suicide by burning his house down. He actually videotaped self with gasoline canisters and threatened violence if someone attempted to stop him. Today states he has been having marital difficulties and that his suicidal statements were simply an attempt to " get to my wife ".  He states " I realize now it was a really stupid thing to say, I was not planning on doing anything ". He reports history of alcohol dependence, but states that he has remained sober recently and denies any current alcohol WDL symptoms. At this time denies active SI, states mood is better, but does remain depressed . Of note, due to episodes of feeling faint as above, and EKG was done, which showed Atrial Fibrillation with RVR. Patient reports no prior history of A fib. Because of this he will be sent to ED for appropriate care, management .  At this time denies medication side effects.    Principal Problem: Major depressive disorder, recurrent episodes.  Diagnosis:   Patient Active Problem List   Diagnosis Date Noted  . A-fib (HCC) [I48.91] 01/28/2016  . Major depressive disorder [F32.9] 01/25/2016  . Intermittent explosive disorder [F63.81] 01/24/2016  . Major depressive disorder, single episode, severe without psychotic features (HCC) [F32.2]   . Alcohol abuse [F10.10] 05/20/2014  . Depression [F32.9] 05/20/2014  . Suicide attempt [T14.91XA] 05/20/2014  . Suicidal ideations [R45.851]   . INGUINAL HERNIA [K40.90] 10/12/2009  . HEMORRHOIDS, WITH BLEEDING [K64.9] 09/28/2008  . HYPERLIPIDEMIA [E78.5] 08/23/2008  .  ERECTILE DYSFUNCTION [F52.8] 07/26/2008  . HYPERTENSION [I10] 07/26/2008  . ALLERGIC RHINITIS [J30.9] 07/26/2008  . GERD [K21.9] 07/26/2008  . PEPTIC ULCER DISEASE [K27.9] 07/26/2008  . NONSPECIFIC ABNORMAL ELECTROCARDIOGRAM [R94.31] 07/26/2008   Total Time spent with patient: 20 minutes   Past Psychiatric History: Alcohol use disorder  Past Medical History:  Past Medical History:  Diagnosis Date  . Heart murmur   . Hypertension    History reviewed. No pertinent surgical history.  Family History:  Family History  Problem Relation Age of Onset  . Heart disease Mother   . Hypertension Mother   . Stroke Mother   . Heart disease Father    Family Psychiatric  History: See H&P  Social History:  History  Alcohol Use  . 10.8 oz/week  . 4 Glasses of wine, 14 Standard drinks or equivalent per week     History  Drug Use No    Social History   Social History  . Marital status: Married    Spouse name: N/A  . Number of children: N/A  . Years of education: N/A   Social History Main Topics  . Smoking status: Former Games developer  . Smokeless tobacco: Never Used  . Alcohol use 10.8 oz/week    4 Glasses of wine, 14 Standard drinks or equivalent per week  . Drug use: No  . Sexual activity: Yes    Birth control/ protection: None   Other Topics Concern  . None   Social History Narrative  . None   Additional Social History:  Sleep: Fair  Appetite:  Fair  Current Medications: Current Facility-Administered Medications  Medication Dose Route Frequency Provider Last Rate Last Dose  . ARIPiprazole (ABILIFY) tablet 5 mg  5 mg Oral Daily Encarnacion Slates, NP   5 mg at 01/27/16 2125  . diltiazem (CARDIZEM) 100 mg in dextrose 5% 151m (1 mg/mL) infusion  5-15 mg/hr Intravenous Continuous BVoncille Lo MD 7.5 mL/hr at 01/28/16 1154 7.5 mg/hr at 01/28/16 1154  . gabapentin (NEURONTIN) capsule 600 mg  600 mg Oral Once CDuffy Bruce MD       Current Outpatient Prescriptions   Medication Sig Dispense Refill  . amLODipine-olmesartan (AZOR) 5-20 MG per tablet Take 1 tablet by mouth 2 (two) times daily. For high blood pressure 60 tablet 0  . aspirin 81 MG tablet Take 162 mg by mouth daily.     .Marland Kitchengabapentin (NEURONTIN) 300 MG capsule Take 1 capsule (300 mg total) by mouth 3 (three) times daily. For agitation 90 capsule 0  . sertraline (ZOLOFT) 50 MG tablet Take 50 mg by mouth daily.    .Marland Kitchenzolpidem (AMBIEN) 5 MG tablet Take 1 tablet (5 mg total) by mouth at bedtime as needed for sleep. (Patient not taking: Reported on 06/19/2014) 30 tablet 0   Lab Results:  Results for orders placed or performed during the hospital encounter of 01/25/16 (from the past 48 hour(s))  TSH     Status: None   Collection Time: 01/28/16  6:17 AM  Result Value Ref Range   TSH 1.828 0.350 - 4.500 uIU/mL    Comment[facility]     Status: Abnormal   Collection Time: 01/28/16  6:17 AM  Result Value Ref Range   Cholesterol 154 0 - 200 mg/dL   Triglycerides 198 (H) <150 mg/dL   HDL 38 (L) >40 mg/dL   Total CHOL/HDL Ratio 4.1 RATIO   VLDL 40 0 - 40 mg/dL   LDL Cholesterol 76 0 - 99 mg/dL    Comment:        Total Cholesterol/HDL:CHD Risk Coronary Heart Disease Risk Table                     Men   Women  1/2 Average Risk   3.4   3.3  Average Risk       5.0   4.4  2 X Average Risk   9.6   7.1  3 X Average Risk  23.4   11.0        Use the calculated Patient Ratio above and the CHD Risk Table to determine the patient's CHD Risk.        ATP III CLASSIFICATION (LDL):  <100     mg/dL   Optimal  100-129  mg/dL   Near or Above                    Optimal  130-159  mg/dL   Borderline  160-189  mg/dL   High  >190     mg/dL   Very High [facility]  T4, free     Status: None   Collection Time: 01/28/16 11:05 AM  Result Value Ref Range   Free T4 0.89 0.61 - 1.12 ng/dL    Comment: (NOTE) Biotin ingestion may interfere with  free T4 tests. If the results are inconsistent with the TSH level, previous test results, or the clinical presentation, then consider biotin interference. If  needed, order repeat testing after stopping biotin.   APTT     Status: None   Collection Time: 01/28/16 11:05 AM  Result Value Ref Range   aPTT 26 24 - 36 seconds  Protime-INR     Status: None   Collection Time: 01/28/16 11:05 AM  Result Value Ref Range   Prothrombin Time 13.2 11.4 - 15.2 seconds   INR 1.00   Magnesium     Status: None   Collection Time: 01/28/16 11:05 AM  Result Value Ref Range   Magnesium 1.7 1.7 - 2.4 mg/dL  Phosphorus     Status: Abnormal   Collection Time: 01/28/16 11:05 AM  Result Value Ref Range   Phosphorus 2.3 (L) 2.5 - 4.6 mg/dL    Blood Alcohol level:  Lab Results  Component Value Date   ETH <5 01/24/2016   ETH 161 (H) 05/19/2014   Metabolic Disorder Labs: No results found for: HGBA1C, MPG No results found for: PROLACTIN Lab Results  Component Value Date   CHOL 154 01/28/2016   TRIG 198 (H) 01/28/2016   HDL 38 (L) 01/28/2016   CHOLHDL 4.1 01/28/2016   VLDL 40 01/28/2016   LDLCALC 76 01/28/2016   Physical Findings: AIMS: Facial and Oral Movements Muscles of Facial Expression: None, normal Lips and Perioral Area: None, normal Jaw: None, normal Tongue: None, normal,Extremity Movements Upper (arms, wrists, hands, fingers): None, normal Lower (legs, knees, ankles, toes): None, normal, Trunk Movements Neck, shoulders, hips: None, normal, Overall Severity Severity of abnormal movements (highest score from questions above): None, normal Incapacitation due to abnormal movements: None, normal Patient's awareness of abnormal movements (rate only patient's report): No Awareness, Dental Status Current problems with teeth and/or dentures?: No Does patient usually wear dentures?: No  CIWA:  CIWA-Ar Total: 2 COWS:     Musculoskeletal: Strength & Muscle Tone: within normal limits Gait &  Station: normal Patient leans: N/A  Psychiatric Specialty Exam: Physical Exam: See H&P  ROS:  No chest pain at this time , no shortness of breath at room air   Blood pressure 140/72, pulse 91, temperature 97.7 F (36.5 C), temperature source Oral, resp. rate 24, height 5\' 10"  (1.778 m), weight 185 lb (83.9 kg), SpO2 98 %.Body mass index is 26.54 kg/m.  General Appearance: Casual  Eye Contact:  good   Speech:  Clear and Coherent and Normal Rate  Volume:  Normal  Mood: states he is feeling better  Affect:  mildly constricted, anxious   Thought Process:  Goal Directed  Orientation:  Full (Time, Place, and Person)  Thought Content:  Linear   Suicidal Thoughts: At this time denies any suicidal plan or intention   Homicidal Thoughts:  No- also specifically denies any violent or homicidal ideations towards wife   Memory:  Immediate;   Fair Recent;   Fair Remote;   Fair  Judgement:  Fair  Insight:  Fair  Psychomotor Activity:  Normal- no psychomotor agitation or restlessness at this time   Concentration:  Concentration: Fair and Attention Span: Fair  Recall:  good   Fund of Knowledge: good   Language:   good   Akathisia:  No  Handed:  Right  AIMS (if indicated):     Assets:  Communication Skills Desire for Improvement Financial Resources/Insurance Housing Intimacy Physical Health Resilience Social Support Talents/Skills Vocational/Educational  ADL's:  Intact  Cognition:  WNL  Sleep:  Number of Hours: 3.75      Assessment - at this time patient reports improving mood  and denies any active suicidal ideations. States he regrets having made suicidal remarks, which he states he made because of marital tension. Today reports pre-syncopal episodes and an EKG shows Atrial Fibrillation with RVR .  Treatment Plan Summary: Daily contact with patient to assess and evaluate symptoms and progress in treatment and Medication management: Because of new onset/newly diagnosed  Atrial  Fibrillation, with RVR , currently  Symptomatic , patient will be transported to ED for appropriate cardiac monitoring and treatment . Discussed this with patient, reviewed rationale, and he agrees .   Neita Garnet, MD, PMHNP 01/28/2016, 1:23 PMPatient ID: Jared [name], male   DOB: [birth year], 55 y.o.   MRN: [id number]

## 2016-01-29 ENCOUNTER — Inpatient Hospital Stay (HOSPITAL_COMMUNITY): Payer: BC Managed Care – PPO

## 2016-01-29 ENCOUNTER — Encounter (HOSPITAL_COMMUNITY): Payer: Self-pay

## 2016-01-29 DIAGNOSIS — R45851 Suicidal ideations: Secondary | ICD-10-CM

## 2016-01-29 DIAGNOSIS — Z87891 Personal history of nicotine dependence: Secondary | ICD-10-CM

## 2016-01-29 DIAGNOSIS — I10 Essential (primary) hypertension: Secondary | ICD-10-CM

## 2016-01-29 DIAGNOSIS — I4891 Unspecified atrial fibrillation: Secondary | ICD-10-CM

## 2016-01-29 DIAGNOSIS — F6381 Intermittent explosive disorder: Secondary | ICD-10-CM

## 2016-01-29 DIAGNOSIS — F322 Major depressive disorder, single episode, severe without psychotic features: Secondary | ICD-10-CM

## 2016-01-29 DIAGNOSIS — Z823 Family history of stroke: Secondary | ICD-10-CM

## 2016-01-29 DIAGNOSIS — Z8249 Family history of ischemic heart disease and other diseases of the circulatory system: Secondary | ICD-10-CM

## 2016-01-29 DIAGNOSIS — E785 Hyperlipidemia, unspecified: Secondary | ICD-10-CM

## 2016-01-29 LAB — ECHOCARDIOGRAM COMPLETE
CHL CUP MV DEC (S): 232
E decel time: 232 msec
E/e' ratio: 5.19
FS: 30 % (ref 28–44)
Height: 70 in
IV/PV OW: 0.95
LA ID, A-P, ES: 34 mm
LA diam index: 1.67 cm/m2
LA vol A4C: 46.8 ml
LA vol index: 23.8 mL/m2
LAVOL: 48.5 mL
LDCA: 4.91 cm2
LEFT ATRIUM END SYS DIAM: 34 mm
LV E/e'average: 5.19
LV PW d: 11.4 mm — AB (ref 0.6–1.1)
LV TDI E'MEDIAL: 6.31
LVEEMED: 5.19
LVELAT: 10 cm/s
LVOT diameter: 25 mm
MV pk A vel: 51.9 m/s
MV pk E vel: 51.9 m/s
P 1/2 time: 493 ms
RV LATERAL S' VELOCITY: 12.8 cm/s
RV TAPSE: 21.7 mm
TDI e' lateral: 10
WEIGHTICAEL: 2920 [oz_av]

## 2016-01-29 LAB — COMPREHENSIVE METABOLIC PANEL
ALK PHOS: 60 U/L (ref 38–126)
ALT: 26 U/L (ref 17–63)
ANION GAP: 7 (ref 5–15)
AST: 18 U/L (ref 15–41)
Albumin: 3.2 g/dL — ABNORMAL LOW (ref 3.5–5.0)
BUN: 6 mg/dL (ref 6–20)
CALCIUM: 8.8 mg/dL — AB (ref 8.9–10.3)
CHLORIDE: 111 mmol/L (ref 101–111)
CO2: 24 mmol/L (ref 22–32)
CREATININE: 0.76 mg/dL (ref 0.61–1.24)
Glucose, Bld: 113 mg/dL — ABNORMAL HIGH (ref 65–99)
Potassium: 3.6 mmol/L (ref 3.5–5.1)
Sodium: 142 mmol/L (ref 135–145)
Total Bilirubin: 0.9 mg/dL (ref 0.3–1.2)
Total Protein: 5.5 g/dL — ABNORMAL LOW (ref 6.5–8.1)

## 2016-01-29 LAB — CBC
HCT: 39 % (ref 39.0–52.0)
Hemoglobin: 12.9 g/dL — ABNORMAL LOW (ref 13.0–17.0)
MCH: 31 pg (ref 26.0–34.0)
MCHC: 33.1 g/dL (ref 30.0–36.0)
MCV: 93.8 fL (ref 78.0–100.0)
PLATELETS: 212 10*3/uL (ref 150–400)
RBC: 4.16 MIL/uL — AB (ref 4.22–5.81)
RDW: 12.9 % (ref 11.5–15.5)
WBC: 5.9 10*3/uL (ref 4.0–10.5)

## 2016-01-29 LAB — HEMOGLOBIN A1C
Hgb A1c MFr Bld: 5.2 % (ref 4.8–5.6)
Mean Plasma Glucose: 103 mg/dL

## 2016-01-29 LAB — HEPARIN LEVEL (UNFRACTIONATED)
HEPARIN UNFRACTIONATED: 0.35 [IU]/mL (ref 0.30–0.70)
Heparin Unfractionated: 0.48 IU/mL (ref 0.30–0.70)

## 2016-01-29 LAB — PROLACTIN: Prolactin: 12.2 ng/mL (ref 4.0–15.2)

## 2016-01-29 MED ORDER — HYDROXYZINE HCL 25 MG PO TABS: 50.0000 mg | ORAL_TABLET | Freq: Three times a day (TID) | ORAL | Status: DC | PRN

## 2016-01-29 MED ORDER — ASPIRIN EC 81 MG PO TBEC: 81.0000 mg | DELAYED_RELEASE_TABLET | Freq: Every day | ORAL | Status: DC

## 2016-01-29 MED ORDER — DILTIAZEM HCL 60 MG PO TABS
60.0000 mg | ORAL_TABLET | Freq: Four times a day (QID) | ORAL | Status: DC
  Administered 2016-01-29 – 2016-01-30 (×4): 60 mg via ORAL
  Filled 2016-01-29 (×4): qty 1

## 2016-01-29 NOTE — Progress Notes (Signed)
ANTICOAGULATION CONSULT NOTE Pharmacy Consult for heparin Indication: atrial fibrillation  No Known Allergies  Patient Measurements: Height: 5\' 10"  (177.8 cm) Weight: 182 lb 8 oz (82.8 kg) IBW/kg (Calculated) : 73 Heparin Dosing Weight: 83.9kg  Vital Signs: Temp: 98.4 F (36.9 C) (10/03 0038) Temp Source: Oral (10/03 0038) BP: 144/74 (10/03 0038) Pulse Rate: 74 (10/03 0038)  Labs:  Recent Labs  01/28/16 1105 01/28/16 1426 01/29/16 0004  HGB  --  13.9  --   HCT  --  40.9  --   PLT  --  217  --   APTT 26  --   --   LABPROT 13.2  --   --   INR 1.00  --   --   HEPARINUNFRC  --   --  0.48  CREATININE  --  0.76  --     Estimated Creatinine Clearance: 107.7 mL/min (by C-G formula based on SCr of 0.76 mg/dL).   Assessment: 55 y.o. male with Afib for heparin  Goal of Therapy:  Heparin level 0.3-0.7 units/ml Monitor platelets by anticoagulation protocol: Yes   Plan:  -Continue Heparin at current rate  Follow-up am labs.  Geannie RisenGreg Erika Slaby, PharmD, BCPS  01/29/2016 12:46 AM

## 2016-01-29 NOTE — Progress Notes (Signed)
  Echocardiogram 2D Echocardiogram has been performed.  Delcie RochENNINGTON, Dmitriy Gair 01/29/2016, 3:46 PM

## 2016-01-29 NOTE — Progress Notes (Signed)
Pt with increasing heart rate this am - heart rate currently sustaining in the 150s - dilt drip increased to from 5 to 7.5 then to 10mg . (see Mar). Pt asymptomatic at this time with increased heart.  Will continue to monitor closely

## 2016-01-29 NOTE — Progress Notes (Signed)
   Subjective: Patient was feeling better. He denies any palpitation or chest pain. He denies any suicidal ideation.  Objective:  Vital signs in last 24 hours: Vitals:   01/29/16 0854 01/29/16 0858 01/29/16 1130 01/29/16 1133  BP: 114/77  (!) 148/97 130/81  Pulse: (!) 154 (!) 152 86   Resp: (!) 21 16 16    Temp:   98.6 F (37 C)   TempSrc:   Oral   SpO2: 97% 96% 98%   Weight:      Height:       Gen. well-developed, well-nourished man, in no acute distress. Lungs. Clear bilaterally. CV. Regular rate and rhythm, no murmur/rub/gallop. Abdomen. Soft , nontender, bowel sounds positive  Extremities. No edema, no cyanosis, pulses 2+ bilaterally.  ECG. Vent. rate 88 BPM PR interval 148 ms QRS duration 76 ms QT/QTc 382/462 ms P-R-T axes 42 18 54 Normal sinus rhythm Nonspecific T wave abnormality Prolonged QT Abnormal ECG  Assessment/Plan:  Mr. Jared [name] 55 y.o man with past medical history significant for severe depression with suicidal ideation, HTN, HLD recently admitted to behavioral health on 01/25/16 after threatening that he will down his house along with himself and his dogs who presents to the ED after a syncopal episode.   New Onset Atrial Fibrillation with RVR: He is in sinus rhythm since 9 AM today. His Cardizem drip was discontinued and he was started on Cardizem 60 mg every 6 hourly. If remains stable he can be started on Cardizem long-acting 240 mg daily. -Repeat echo today. -No anticoagulation because of low CHA2DS-VASc  Score.  Hypertension. He remains mostly normotensive with Cardizem. Discontinue his home antihypertensive. Continue with Cardizem, and reassess again.  HLD: ASCVD risk factor of 0.31 and 14.83 %. -Start him on Lipitor 20 mg daily.  Major Depression with Suicidal Ideation: [facility]. At ED he states that he was never serious about suicidal ideation, he was doing all that to get his wife's  attention. He was given Abilify and hydroxyzine at behavioral health, which has less than 1% risk of A. Fib. Discontinue Abilify and hydroxyzine. Psych reevaluation for a different regimen. And to find out whether it's safe to discharge him to home.  DVT/PE ppx: Heparin FEN: Low sodium CODE:Full Dispo: Anticipated discharge in approximately 1-2 day(s).   Jared CourserSumayya [name], MD 01/29/2016, 3:32 PM Pager: [phone]
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
## 2016-01-29 NOTE — Progress Notes (Signed)
ANTICOAGULATION CONSULT NOTE Pharmacy Consult for heparin Indication: atrial fibrillation  No Known Allergies  Patient Measurements: Height: 5\' 10"  (177.8 cm) Weight: 182 lb 8 oz (82.8 kg) IBW/kg (Calculated) : 73 Heparin Dosing Weight: 83.9kg  Vital Signs: Temp: 97.7 F (36.5 C) (10/03 0728) Temp Source: Oral (10/03 0728) BP: 116/88 (10/03 0730) Pulse Rate: 78 (10/03 0728)  Labs:  Recent Labs  01/28/16 1105 01/28/16 1426 01/29/16 0004 01/29/16 0326  HGB  --  13.9  --  12.9*  HCT  --  40.9  --  39.0  PLT  --  217  --  212  APTT 26  --   --   --   LABPROT 13.2  --   --   --   INR 1.00  --   --   --   HEPARINUNFRC  --   --  0.48 0.35  CREATININE  --  0.76  --  0.76    Estimated Creatinine Clearance: 107.7 mL/min (by C-G formula based on SCr of 0.76 mg/dL).   Assessment: 55 y.o. male with Afib on heparin (CHADSVASC=1). He is at goal on 1200 units/hr  Goal of Therapy:  Heparin level 0.3-0.7 units/ml Monitor platelets by anticoagulation protocol: Yes   Plan:  -Continue Heparin at current rate  -Daily heparin level and CBC -Will follow patient plans  Harland Germanndrew Shaima Sardinas, Pharm D 01/29/2016 8:57 AM
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
## 2016-01-30 DIAGNOSIS — I5032 Chronic diastolic (congestive) heart failure: Secondary | ICD-10-CM

## 2016-01-30 DIAGNOSIS — I480 Paroxysmal atrial fibrillation: Secondary | ICD-10-CM

## 2016-01-30 LAB — CBC
HCT: 39.4 % (ref 39.0–52.0)
Hemoglobin: 13.2 g/dL (ref 13.0–17.0)
MCH: 31.5 pg (ref 26.0–34.0)
MCHC: 33.5 g/dL (ref 30.0–36.0)
MCV: 94 fL (ref 78.0–100.0)
PLATELETS: 206 10*3/uL (ref 150–400)
RBC: 4.19 MIL/uL — AB (ref 4.22–5.81)
RDW: 12.9 % (ref 11.5–15.5)
WBC: 6 10*3/uL (ref 4.0–10.5)

## 2016-01-30 MED ORDER — DILTIAZEM HCL ER COATED BEADS 240 MG PO CP24
240.0000 mg | ORAL_CAPSULE | Freq: Every day | ORAL | Status: DC
  Administered 2016-01-30 – 2016-01-31 (×2): 240 mg via ORAL
  Filled 2016-01-30 (×2): qty 1

## 2016-01-30 MED ORDER — RIVAROXABAN 20 MG PO TABS: 20.0000 mg | ORAL_TABLET | Freq: Every day | ORAL | Status: DC

## 2016-01-30 MED ORDER — ASPIRIN EC 81 MG PO TBEC
81.0000 mg | DELAYED_RELEASE_TABLET | Freq: Every day | ORAL | Status: DC
  Administered 2016-01-30: 81 mg via ORAL
  Filled 2016-01-30: qty 1

## 2016-01-30 MED ORDER — LOSARTAN POTASSIUM 50 MG PO TABS
50.0000 mg | ORAL_TABLET | Freq: Every day | ORAL | Status: DC
  Administered 2016-01-30 – 2016-01-31 (×2): 50 mg via ORAL
  Filled 2016-01-30 (×2): qty 1

## 2016-01-30 MED ORDER — LOSARTAN POTASSIUM 50 MG PO TABS: 50.0000 mg | ORAL_TABLET | Freq: Every day | ORAL | Status: DC

## 2016-01-30 MED ORDER — LOSARTAN POTASSIUM 25 MG PO TABS
25.0000 mg | ORAL_TABLET | Freq: Every day | ORAL | Status: DC
  Filled 2016-01-30: qty 1

## 2016-01-30 NOTE — Progress Notes (Signed)
Patient Name: Jared [name] Date of Encounter: 01/30/2016  Primary Cardiologist: [facility] Problem List     Principal Problem:   Major depressive disorder, single episode, severe without psychotic features [facility]) Active Problems:   Intermittent explosive disorder   Atrial fibrillation with RVR (HCC)   Subjective   No chest pain or sob. Had lot of family drama in room.   Inpatient Medications    Scheduled Meds: . aspirin EC  81 mg Oral Daily  . diltiazem  240 mg Oral Daily  . gabapentin  600 mg Oral BID  . losartan  25 mg Oral Daily  . sodium chloride flush  3 mL Intravenous Q12H   Continuous Infusions:   PRN Meds:.hydrOXYzine, zolpidem   Vital Signs    Vitals:   01/30/16 0100 01/30/16 0400 01/30/16 0532 01/30/16 0728  BP:  (!) 150/90  140/89  Pulse: 66 (!) 57  79  Resp: 18 20  14   Temp:  97.9 F (36.6 C)  98.3 F (36.8 C)  TempSrc:  Oral  Oral  SpO2: 94% 93%  98%  Weight:   180 lb 14.4 oz (82.1 kg)   Height:        Intake/Output Summary (Last 24 hours) at 01/30/16 0949 Last data filed at 01/30/16 0500  Gross per 24 hour  Intake            833.5 ml  Output             3300 ml  Net          -2466.5 ml   Filed Weights   01/28/16 1749 01/30/16 0532  Weight: 182 lb 8 oz (82.8 kg) 180 lb 14.4 oz (82.1 kg)    Physical Exam   GEN: Well nourished, well developed, in no acute distress.  HEENT: Grossly normal.  Neck: Supple, no JVD, carotid bruits, or masses. Cardiac: RRR, no murmurs, rubs, or gallops. No clubbing, cyanosis, edema.  Radials/DP/PT 2+ and equal bilaterally.  Respiratory:  Respirations regular and unlabored, clear to auscultation bilaterally. GI: Soft, nontender, nondistended, BS + x 4. MS: no deformity or atrophy. Skin: warm and dry, no rash. Neuro:  Strength and sensation are intact. Psych: AAOx3.  Normal affect.  Labs    CBC  Recent Labs  01/29/16 0326 01/30/16 0338  WBC 5.9 6.0  HGB 12.9* 13.2  HCT 39.0 39.4    MCV 93.8 94.0  PLT 212 206   Basic Metabolic Panel  Recent Labs  01/28/16 1105 01/28/16 1426 01/29/16 0326  NA  --  141 142  K  --  4.0 3.6  CL  --  113* 111  CO2  --  22 24  GLUCOSE  --  118* 113*  BUN  --  6 6  CREATININE  --  0.76 0.76  CALCIUM  --  8.9 8.8*  MG 1.7  --   --   PHOS 2.3*  --   --    Liver Function Tests  Recent Labs  01/28/16 1426 01/29/16 0326  AST 21 18  ALT 30 26  ALKPHOS 71 60  BILITOT 0.7 0.9  PROT 6.0* 5.5*  ALBUMIN 3.8 3.2*   No results for input(s): LIPASE, AMYLASE in the last 72 hours. Cardiac Enzymes No results for input(s): CKTOTAL, CKMB, CKMBINDEX, TROPONINI in the last 72 hours. BNP Invalid input(s): POCBNP D-Dimer No results for input(s): DDIMER in the last 72 hours. Hemoglobin A1C  Recent Labs  01/28/16 0617  HGBA1C  5.2   Fasting Lipid Panel  Recent Labs  01/28/16 0617  CHOL 154  HDL 38*  LDLCALC 76  TRIG [id number]*  CHOLHDL 4.1   Thyroid Function Tests  Recent Labs  01/28/16 0617  TSH 1.828    Telemetry     Sinus rhythm at rate of 90-110s  ECG    EKG 01/29/16:  Sinus rhythm with non specific TWI in lateral leads Vent. rate 88 BPM PR interval 148 ms QRS duration 76 ms QT/QTc 382/462 ms P-R-T axes 42 18 54  Radiology    Ct Head Wo Contrast  Result Date: 01/28/2016 CLINICAL DATA:  Recent syncopal episodes overnight, new onset atrial fibrillation EXAM: CT HEAD WITHOUT CONTRAST CT CERVICAL SPINE WITHOUT CONTRAST TECHNIQUE: Multidetector CT imaging of the head and cervical spine was performed following the standard protocol without intravenous contrast. Multiplanar CT image reconstructions of the cervical spine were also generated. COMPARISON:  None. FINDINGS: CT HEAD FINDINGS Brain: No evidence of acute infarction, hemorrhage, hydrocephalus, extra-axial collection or mass lesion/mass effect. Vascular: No hyperdense vessel or unexpected calcification. Skull: Normal. Negative for fracture or focal lesion.  Sinuses/Orbits: No acute finding. Other: None CT CERVICAL SPINE FINDINGS Alignment: Well-maintained Skull base and vertebrae: 7 cervical segments are well visualized. No compression deformities are noted. No anterolisthesis is seen. Mild facet hypertrophic changes are noted. Posterior fusion defect is noted at C5. Soft tissues and spinal canal: Within normal limits. Disc levels: Osteophytic changes are noted most prominent at C3-4 and C5-6. Upper chest: Lung apices are within normal limits. Other: No other focal abnormality is noted. IMPRESSION: CT of the head:  No acute intracranial abnormality noted. CT of the cervical spine: Multilevel degenerative change without acute abnormality. Electronically Signed   By: Alcide CleverMark  Lukens M.D.   On: 01/28/2016 11:11   Ct Cervical Spine Wo Contrast  Result Date: 01/28/2016 CLINICAL DATA:  Recent syncopal episodes overnight, new onset atrial fibrillation EXAM: CT HEAD WITHOUT CONTRAST CT CERVICAL SPINE WITHOUT CONTRAST TECHNIQUE: Multidetector CT imaging of the head and cervical spine was performed following the standard protocol without intravenous contrast. Multiplanar CT image reconstructions of the cervical spine were also generated. COMPARISON:  None. FINDINGS: CT HEAD FINDINGS Brain: No evidence of acute infarction, hemorrhage, hydrocephalus, extra-axial collection or mass lesion/mass effect. Vascular: No hyperdense vessel or unexpected calcification. Skull: Normal. Negative for fracture or focal lesion. Sinuses/Orbits: No acute finding. Other: None CT CERVICAL SPINE FINDINGS Alignment: Well-maintained Skull base and vertebrae: 7 cervical segments are well visualized. No compression deformities are noted. No anterolisthesis is seen. Mild facet hypertrophic changes are noted. Posterior fusion defect is noted at C5. Soft tissues and spinal canal: Within normal limits. Disc levels: Osteophytic changes are noted most prominent at C3-4 and C5-6. Upper chest: Lung apices are  within normal limits. Other: No other focal abnormality is noted. IMPRESSION: CT of the head:  No acute intracranial abnormality noted. CT of the cervical spine: Multilevel degenerative change without acute abnormality. Electronically Signed   By: Alcide CleverMark  Lukens M.D.   On: 01/28/2016 11:11   Dg Chest Portable 1 View  Result Date: 01/28/2016 CLINICAL DATA:  New onset atrial fibrillation beginning this morning. EXAM: PORTABLE CHEST 1 VIEW COMPARISON:  09/01/2009 FINDINGS: The heart size and mediastinal contours are within normal limits. Both lungs are clear. The visualized skeletal structures are unremarkable. IMPRESSION: No active disease. Electronically Signed   By: Paulina FusiMark  Shogry M.D.   On: 01/28/2016 12:26     Cardiac Studies   Pending echo  Patient Profile     Wil Slape is a 55 y.o. male with past medical history of HTN, prior alcohol abuse, and severe depression with suicidal ideation who was recently admitted to [facility] on 01/25/2016 after threatening to kill himself and burn his home up with the pets inside cardiology is consulted for afib RVR  Echo 01/29/16 LV EF: 55% -   60%  ------------------------------------------------------------------- Indications:      [address].  ------------------------------------------------------------------- History:   Risk factors:  Hypertension. Dyslipidemia.  ------------------------------------------------------------------- Study Conclusions  - Left ventricle: The cavity size was mildly dilated. Systolic   function was normal. The estimated ejection fraction was in the   range of 55% to 60%. Wall motion was normal; there were no   regional wall motion abnormalities. Features are consistent with   a pseudonormal left ventricular filling pattern, with concomitant   abnormal relaxation and increased filling pressure (grade 2   diastolic dysfunction). - Aortic valve: There was mild regurgitation. - Atrial septum: There was  increased thickness of the septum,   consistent with lipomatous hypertrophy.  Assessment & Plan    1. Afib with RVR - Admitted to [facility] and started on Abilify and Trazodone. Syncope episode. Now found to have afib with RVR. TSH normal. Echo showed LV EF of 55-60%, no wm abnormality, grade 2 DD, mild AI, lipomatous hypertrophy of atrial septum.  - converted sinus rhythm 01/29/16 @ 9:30am. Maintaining sinus rhythm. Rate of 90-100s. Consider up titrating cardizem.  -  cha2ds2vasc score is a 1 --> likely does not requires anticoagulation. Will defer to MD.  Xarelto started by Primary  2. HTN - He was taking (amlodipine and benicar combination). Discontinue. Continue cardizem and losartan.   3. Abnormal EKG  - Sinus rhythm with non specific TWI in lateral leads yesterday. Will repeat EKG. No chest pain.   Otherwise per primary   Bhagat,Bhavinkumar, [facility]  Patient seen and examined. Agree with assessment and plan.  I had a long discussion with patient and wife.  EKG today reveals normal sinus rhythm 90 bpm.  QTc interval 430 milliseconds.  Nonspecific ST-T changes.  Echo Doppler study reveals normal systolic function with grade 2 diastolic dysfunction, which I suspect may be related to his history of hypertension.  There is evidence for mild aortic insufficiency and trivial mitral regurgitation.  He is maintaining sinus rhythm.  Short acting Cardizem is being transitioned to Cardizem CD 240 mg today.  Patient had been on Benicar 40 mg as part of his Azor 10/40 daily dosing.  Will initiate losartan at 50 mg today , which is a reduced dose equivalent compared to the higher dose Benicar 40 mg which will be beneficial with his grade 2 diastolic dysfunction.  I would recommend that the patient be monitored today  in the hospital.  The patient does not have any prior history of atrial fibrillation.  His only risk factor is hypertension which gives him a cha2ds2vasc score of 1.  Since this was his  only episode of atrial fibrillation and is atrial fibrillation, duration was less than 48 hours.  I do not feel he requires initiation of long-term anticoagulation therapy, but would benefit from initiation of aspirin.  However, if he does develop recurrent symptomatology he will need medication adjustment and anti-caogulation can be implemented.   Lennette Bihari, MD, [facility] 01/30/2016 10:42 AM

## 2016-01-30 NOTE — Clinical Social Work Psych Note (Addendum)
Psych CSW met with patient and wife at bedside (1:45am-1:26pm) Wife is adamant that patient be discharged home and is at baseline.  Patient and wife state the patient was in the process of being discharged from [facility] when he was admitted to [facility] in afib.  Patient and wife are requesting a reconsult to psychiatry in attempts for patient to be discharged home.  Psychiatry to determine disposition.  If [facility] continues to be recommended the patient's IVC will need to be updated as it expires 10/5.  Psych CSW received consult that patient is from [facility].  Psych CSW contacted Charge at [facility] to inquire of patient's return.  Per psychiatry note yesterday 01/29/2016, the patient is in continued need for inpatient psychiatric hospitalization.  Nonnie Done, MSW, LCSW  [phone]  Licensed Clinical Social Worker

## 2016-01-30 NOTE — Progress Notes (Signed)
The client during assessment was very loud, passive aggressively speaking and using body language when discussing his care. Trying to be intimidating in a dominating manner. When asked about SI thoughts or plans to harm himself or others he spoke in a very scripted manner as to tell me what I wanted to hear. Saying a very quick "No I don't want to harm myself or others and I never tried to, it was the TIA causing all of this not really me. Now that that is better I'm better" and ended with saying "blah blah blah." The client has a SI sitter and I will continue to monitor closely.   Sheppard Evensina Tracie Dore RN

## 2016-01-30 NOTE — Progress Notes (Signed)
   Subjective: Patient was feeling better. He denies any palpitation or chest pain. He denies any suicidal ideation. Patient and his wife was really upset after hearing that they need to go back to behavioral health facility. His wife states that he never had any psychiatric history and they don't want to go back to that facility because of their poor management.   Objective:  Vital signs in last 24 hours: Vitals:   01/30/16 0100 01/30/16 0400 01/30/16 0532 01/30/16 0728  BP:  (!) 150/90  140/89  Pulse: 66 (!) 57  79  Resp: 18 20  14   Temp:  97.9 F (36.6 C)  98.3 F (36.8 C)  TempSrc:  Oral  Oral  SpO2: 94% 93%  98%  Weight:   180 lb 14.4 oz (82.1 kg)   Height:       Gen. well-developed, well-nourished man, in no acute distress. Lungs. Clear bilaterally. CV. Regular rate and rhythm, no murmur/rub/gallop. Abdomen. Soft , nontender, bowel sounds positive  Extremities. No edema, no cyanosis, pulses 2+ bilaterally.  ECHO. LV EF: 55% -   60  ------------------------------------------------------------------- History:   Risk factors:  Hypertension. Dyslipidemia.  ------------------------------------------------------------------- Study Conclusions  - Left ventricle: The cavity size was mildly dilated. Systolic   function was normal. The estimated ejection fraction was in the range of 55% to 60%. Wall motion was normal; there were no regional wall motion abnormalities. Features are consistent with a pseudonormal left ventricular filling pattern, with concomitant abnormal relaxation and increased filling pressure (grade 2 diastolic dysfunction). - Aortic valve: There was mild regurgitation. - Atrial septum: There was increased thickness of the septum, consistent with lipomatous hypertrophy.  Assessment/Plan:  [name] 55 y.o man with past medical history significant for severe depression with suicidal ideation, HTN, HLDrecently admitted to behavioral health on  09/29/17after threatening that he will down his house along with himself and his dogs who presents to the ED after a syncopal episode.   New Onset Atrial Fibrillation with RVR: He is in sinus rhythm since yesterday. He remains stable on Cardizem 60 mg every 6 hourly. -Start him on Cardizem CD 240 mg daily. We were concerned regarding his CHA2DS-VASc  Score, going up to 2 due to his grade 2 diastolic dysfunction on echo yesterday, and thinking of starting him on long-term anticoagulation with Xarelto. Dr. Tresa EndoKelly from cardiology and advised that we do not need any long-term anticoagulation at this point, just keep him on low-dose aspirin daily.  Hypertension. His blood pressure was staying at a high yesterday, even with Cardizem to 240 mg. -Start losartan 50 mg daily. -Continue Cardizem.  HLD: ASCVD risk factor of 0.31 and 14.83 %. -Start him on Lipitor 20 mg daily.  Major Depression with Suicidal Ideation: [facility] for suicidal ideation. At ED he states that he was never serious about suicidal ideation,he was doing all that to get his wife's attention. He was given Abilify and hydroxyzine at behavioral health, which has less than 1% risk of A. Fib.  Abilify and hydroxyzine was discontinued yesterday. During the evaluation with psych and they advise continue inpatient psych treatment.  DVT/PE ppx: Heparin FEN:Low sodium CODE:Full Dispo: Anticipated discharge in approximately 1 day(s).   Arnetha CourserSumayya Xavyer Steenson, MD 01/30/2016, 11:49 AM Pager: [phone]

## 2016-01-31 DIAGNOSIS — I110 Hypertensive heart disease with heart failure: Secondary | ICD-10-CM

## 2016-01-31 DIAGNOSIS — Z79899 Other long term (current) drug therapy: Secondary | ICD-10-CM

## 2016-01-31 DIAGNOSIS — Z7982 Long term (current) use of aspirin: Secondary | ICD-10-CM

## 2016-01-31 DIAGNOSIS — I5032 Chronic diastolic (congestive) heart failure: Secondary | ICD-10-CM

## 2016-01-31 DIAGNOSIS — F332 Major depressive disorder, recurrent severe without psychotic features: Secondary | ICD-10-CM

## 2016-01-31 LAB — CBC
HEMATOCRIT: 39.8 % (ref 39.0–52.0)
Hemoglobin: 13.3 g/dL (ref 13.0–17.0)
MCH: 31.4 pg (ref 26.0–34.0)
MCHC: 33.4 g/dL (ref 30.0–36.0)
MCV: 93.9 fL (ref 78.0–100.0)
Platelets: 203 10*3/uL (ref 150–400)
RBC: 4.24 MIL/uL (ref 4.22–5.81)
RDW: 12.8 % (ref 11.5–15.5)
WBC: 6.3 10*3/uL (ref 4.0–10.5)

## 2016-01-31 LAB — BASIC METABOLIC PANEL
ANION GAP: 8 (ref 5–15)
BUN: 11 mg/dL (ref 6–20)
CHLORIDE: 107 mmol/L (ref 101–111)
CO2: 25 mmol/L (ref 22–32)
Calcium: 9.2 mg/dL (ref 8.9–10.3)
Creatinine, Ser: 0.83 mg/dL (ref 0.61–1.24)
GFR calc Af Amer: >60 mL/min (ref >60)
GFR calc non Af Amer: >60 mL/min (ref >60)
GLUCOSE: 88 mg/dL (ref 65–99)
POTASSIUM: 4 mmol/L (ref 3.5–5.1)
Sodium: 140 mmol/L (ref 135–145)

## 2016-01-31 MED ORDER — LOSARTAN POTASSIUM 100 MG PO TABS: 100.0000 mg | ORAL_TABLET | Freq: Every day | ORAL | 0 refills | Status: DC

## 2016-01-31 MED ORDER — GABAPENTIN 300 MG PO CAPS: 600.0000 mg | ORAL_CAPSULE | Freq: Two times a day (BID) | ORAL | 0 refills | Status: DC

## 2016-01-31 MED ORDER — LOSARTAN POTASSIUM 50 MG PO TABS: 100.0000 mg | ORAL_TABLET | Freq: Every day | ORAL | Status: DC

## 2016-01-31 MED ORDER — DILTIAZEM HCL ER COATED BEADS 240 MG PO CP24: 240.0000 mg | ORAL_CAPSULE | Freq: Every day | ORAL | 0 refills | Status: DC

## 2016-01-31 NOTE — Discharge Summary (Signed)
Name: Jared [name] MRN: [id number] DOB: 09/14/1960 55 y.o. PCP: No Pcp Per Patient  Date of Admission: 01/28/2016  4:43 PM Date of Discharge: 01/31/2016 Attending Physician: No att. providers found  Discharge Diagnosis: 1. A. fib with RVR. 2. Major depressive disorder. 3. Diastolic heart failure grade 2. 4. Hypertension.  Discharge Medications:   Medication List    STOP taking these medications   amLODipine-olmesartan 5-20 MG tablet Commonly known as:  AZOR     TAKE these medications   aspirin 81 MG tablet Take 162 mg by mouth daily.   diltiazem 240 MG 24 hr capsule Commonly known as:  CARDIZEM CD Take 1 capsule (240 mg total) by mouth daily. Start taking on:  02/01/2016   gabapentin 300 MG capsule Commonly known as:  NEURONTIN Take 2 capsules (600 mg total) by mouth 2 (two) times daily. What changed:  how much to take  when to take this  additional instructions   GLUCOSAMINE PO Take 1 tablet by mouth daily.   losartan 100 MG tablet Commonly known as:  COZAAR Take 1 tablet (100 mg total) by mouth daily. Start taking on:  02/01/2016   Melatonin 10 MG Tabs Take 10 mg by mouth at bedtime.       Disposition and follow-up:   Jared [name] was discharged from [facility] in Good condition.  At the hospital follow up visit please address:  1.  His heart rate and rhythm, Dr. Jama Flavors wants to start him on Depakote 500 mg daily, patient was not interested at this time. Just check if it is not going to affect his A. Fib. If he decided to take that in the future.  2.  Labs / imaging needed at time of follow-up: ECG  3.  Pending labs/ test needing follow-up: None  Follow-up Appointments: Follow-up Information    [facility] Course, [place]. Go on 02/15/2016.   Specialties:  Cardiology, Radiology Why:  @1 :30 post hospital Contact information: [address] [place] [address] [phone]        [facility]. Go on 02/07/2016.   Specialty:  Behavioral Health Why:  Appointment with [place] [phone]) at [facility] on Oct 12 at 0845; bring your Insurance Card Contact information: [address] [place] [address] [phone]          Hospital Course by problem list: Jared [name] 56 y.o man with past medical history significant for severe depression with suicidal ideation, HTN, HLDrecently admitted to behavioral health on 09/29/17after threatening that he will down his house along with himself and his dogs who presents to the ED after a syncopal episode X 2.  1. Atrial fibrillation with RVR.In the ED he was found to be in Afib with RVR which was new for him. He was started on Cardizem infusion, he was converted to sinus rhythm around 9 AM next morning. Never had another episode. Rate was controlled, mostly staying in 60s and 70s. He was completely asymptomatic on discharge. He was not considered for long-term anticoagulation due to CHA2DS-VASc  Score of 1. He was discharged on Cardizem 240 mg daily, and aspirin 81 mg daily. Follow-up with cardiology as directed.  2. Grade 2 diastolic dysfunction. Echo was done as a workup for his recent episode of A. Fib. He was found to have grade 2 diastolic dysfunction with normal ejection fraction. He was also having some mild hypertrophy of atrial septum. He does not exhibit any signs of  heart failure.  3. Hypertension.He was taking (amlodipine and benicar combination) at home for his blood pressure. It was discontinued. He was discharged home on losartan 100 mg daily and Cardizem 240 mg daily.  4. Major depressive disorder.Patient was recently admitted to [facility] hospital for suicidal ideation. At ED he states that he was never serious about suicidal ideation,he was doing all that to get his wife's attention. He was given Abilify and hydroxyzine at behavioral health,which has less than 1% risk of A. Fib.  Abilify and  hydroxyzine was discontinued. Although he was not actively suicidal and denies any plans. He does show some aggressive behavior during his hospital stay. During reevaluation initially psychiatric advice to continue inpatient treatment. Patient and his wife refused to go back to that facility. They requested a second opinion by Dr. Jama Flavors. Who after detailed reevaluation cleared him to go home, with close follow-up with behavioral health. He set him up to go to a mental health rehabilitation program starting from next week. Dr. Jama Flavors also advised him to start Depakote 500 mg, because of his aggressive behavior. Patient refuse to start any new medication at this point. We advised the patient to discuss Depakote's affect on his heart, and then he can start the medicine if he wishes to.  Discharge Vitals:   BP 138/82 (BP Location: Right Arm)   Pulse 77   Temp 98.6 F (37 C) (Oral)   Resp (!) 22   Ht 5\' 10"  (1.778 m)   Wt 180 lb 14.4 oz (82.1 kg)   SpO2 97%   BMI 25.96 kg/m   Gen.well-developed, well-nourished man, in no acute distress. Lungs.Clear bilaterally. CV. Regular rate and rhythm,no murmur/rub/gallop. Abdomen.Soft ,nontender, bowel sounds positive Extremities.No edema, no cyanosis, pulses 2+ bilaterally.  Pertinent Labs, Studies, and Procedures:   CBC Latest Ref Rng & Units 01/31/2016 01/30/2016 01/29/2016  WBC 4.0 - 10.5 K/uL 6.3 6.0 5.9  Hemoglobin 13.0 - 17.0 g/dL 16.1 09.6 12.9(L)  Hematocrit 39.0 - 52.0 % 39.8 39.4 39.0  Platelets 150 - 400 K/uL 203 206 212   CMP Latest Ref Rng & Units 01/31/2016 01/29/2016 01/28/2016  Glucose 65 - 99 mg/dL 88 045(W) 098(J)  BUN 6 - 20 mg/dL 11 6 6   Creatinine 0.61 - 1.24 mg/dL 1.91 4.78 2.95  Sodium 135 - 145 mmol/L 140 142 141  Potassium 3.5 - 5.1 mmol/L 4.0 3.6 4.0  Chloride 101 - 111 mmol/L 107 111 113(H)  CO2 22 - 32 mmol/L 25 24 22   Calcium 8.9 - 10.3 mg/dL 9.2 6.2(Z) 8.9  Total Protein 6.5 - 8.1 g/dL - 5.5(L) 6.0(L)  Total  Bilirubin 0.3 - 1.2 mg/dL - 0.9 0.7  Alkaline Phos 38 - 126 U/L - 60 71  AST 15 - 41 U/L - 18 21  ALT 17 - 63 U/L - 26 30   ECHO. LV EF: 55% -   60%  -Study Conclusions  - Left ventricle: The cavity size was mildly dilated. Systolic function was normal. The estimated ejection fraction was in the  range of 55% to 60%. Wall motion was normal; there were no regional wall motion abnormalities. Features are consistent with a pseudonormal left ventricular filling pattern, with concomitant abnormal relaxation and increased filling pressure (grade 2 diastolic dysfunction). - Aortic valve: There was mild regurgitation. - Atrial septum: There was increased thickness of the septum, consistent with lipomatous hypertrophy.    Discharge Instructions: Discharge Instructions    Diet - low sodium heart healthy    Complete  by:  As directed    Discharge instructions    Complete by:  As directed    It was pleasure taking care of you. Please follow-up with your cardiologist as advised. Please follow-up with your psychiatrist according to your appointment. Your psychiatrist think that you will really benefit from there outpatient behavioral health program. They also want you to start medicine called Depakote, I understand that you do not want to add another medicine at this point. Please discuss that with your cardiologist during your next follow-up visit. You can always ask your psychiatrist to start that if it's okay with the cardiology. If you ever feel palpitations or abnormal beating of her heart, seek medical attention.   Increase activity slowly    Complete by:  As directed       Signed: Arnetha CourserSumayya Jalon Squier, MD 01/31/2016, 4:49 PM   Pager: [phone]

## 2016-01-31 NOTE — Progress Notes (Signed)
Counseled patient on new medications (losartan and diltiazem) in regards to indications, side effects, and how to properly take medications.   Monika SalkAngela Donabelle Molden, PharmD Candidate, [facility]

## 2016-01-31 NOTE — Consult Note (Signed)
[facility] Consult   Reason for Consult: Follow up  Referring Physician:  Dr. Daryll Drown Patient Identification: Jared [name] MRN:  [id number] Principal Diagnosis: Major depressive disorder, single episode, severe without psychotic features [facility]) Diagnosis:   Patient Active Problem List   Diagnosis Date Noted  . PAF (paroxysmal atrial fibrillation) ([place]) [I48.0]   . Chronic diastolic heart failure ([place]) [I50.32]   . Atrial fibrillation with RVR ([place]) [I48.91] 01/28/2016  . Major depressive disorder [F32.9] 01/25/2016  . Intermittent explosive disorder [F63.81] 01/24/2016  . Major depressive disorder, single episode, severe without psychotic features ([place]) [F32.2]   . Alcohol abuse [F10.10] 05/20/2014  . Depression [F32.9] 05/20/2014  . Suicide attempt [T14.91XA] 05/20/2014  . Suicidal ideations [R45.851]   . INGUINAL HERNIA [K40.90] 10/12/2009  . HEMORRHOIDS, WITH BLEEDING [K64.9] 09/28/2008  . HLD (hyperlipidemia) [E78.5] 08/23/2008  . ERECTILE DYSFUNCTION [F52.8] 07/26/2008  . Essential hypertension [I10] 07/26/2008  . ALLERGIC RHINITIS [J30.9] 07/26/2008  . GERD [K21.9] 07/26/2008  . PEPTIC ULCER DISEASE [K27.9] 07/26/2008  . NONSPECIFIC ABNORMAL ELECTROCARDIOGRAM [R94.31] 07/26/2008    Total Time spent with patient: 45 minutes  Subjective:   Jared [name] is a 55 y.o. male patient admitted with Atrial Fibrillation  HPI:    Please refer to Prior Psychiatric Assessment and Dr. Barnetta Hammersmith Consult note. Patient and wife requesting psychiatry reassessment. I have met with patient and with wife, who reports she has been spending time with patient daily while he has been  in the hospital. Jared [name] is a 55 year old man , who was initially (under IVC generated by wife,) admitted to inpatient psychiatric unit due to making threats of burning house, self . At the time he reported he had not  actually intended to hurt self or anyone else , and stated it was an  attempt to " piss off wife", and to get her attention. During this psychiatric admission patient presented with episodes of falling ,syncope, and EKG showed  Atrial Fibrillation , associated  with tachycardia. He was transferred  to Medical Unit for appropriate cardiac care . (Patient's heart is now back to Normal ( Sinusal ) rhythm and at this time is asymptomatic.)  At this time patient denies any suicidal ideations , denies any self injurious ideations, denies any homicidal ideations, and with regards to recent suicidal/ threatening statements and behaviors that led to admission,he  states " I don't know what I was thinking that day, it was stupid of me to do that".  He states that he had been irritated at wife because he felt she was ignoring him.  His wife corroborates that at this time patient seems improved, closer to his  baseline , and agrees that he has not expressed any further suicidal or self injurious , or any threatening statements .   Patient reports he has history of alcohol dependence, but has been sober x 6 months. He does state he has a history of irritability, anger issues. Wife reports that patient has been stable and much improved since he has been sober, but that even though sober, he has continued  to present with a vague, often subtle  irritability, which varies from day to day, but is frequently present . She states she sees a relationship between not taking afternoon neurontin doses, and increased irritability. Patient states he has not noticed this pattern .  She states that during this admission he has had the understandable irritability and anxiety related to having a new cardiac condition and being admitted  to a medical setting, but that he has not exhibited any overt anger or any explosive, threatening behaviors or statements.  Patient is wanting to be discharged home and wife is in agreement.     Past Psychiatric History: History of alcohol dependence, has been sober for  6 months, history of prior psychiatric admission 1/16 due to suicidal ideations, alcohol abuse   Risk to Self: Is patient at risk for suicide?: Yes Risk to Others:   Prior Inpatient Therapy:   Prior Outpatient Therapy:    Past Medical History:  Past Medical History:  Diagnosis Date  . Hypertension   . PVC (premature ventricular contraction)    History reviewed. No pertinent surgical history. Family History:  Family History  Problem Relation Age of Onset  . Heart disease Mother   . Hypertension Mother   . Stroke Mother   . Heart disease Father    Family Psychiatric  History:  Social History:  History  Alcohol Use No    Comment: Quit in 07/2015.     History  Drug Use No    Social History   Social History  . Marital status: Married    Spouse name: N/A  . Number of children: N/A  . Years of education: N/A   Social History Main Topics  . Smoking status: Former Research scientist (life sciences)  . Smokeless tobacco: Former Systems developer    Quit date: 01/28/1996  . Alcohol use No     Comment: Quit in 07/2015.  . Drug use: No  . Sexual activity: Yes    Birth control/ protection: None   Other Topics Concern  . None   Social History Narrative  . None   Additional Social History:    Allergies:  No Known Allergies  Labs:  Results for orders placed or performed during the hospital encounter of 01/28/16 (from the past 48 hour(s))  CBC     Status: Abnormal   Collection Time: 01/30/16  3:38 AM  Result Value Ref Range   WBC 6.0 4.0 - 10.5 K/uL   RBC 4.19 (L) 4.22 - 5.81 MIL/uL   Hemoglobin 13.2 13.0 - 17.0 g/dL   HCT 39.4 39.0 - 52.0 %   MCV 94.0 78.0 - 100.0 fL   MCH 31.5 26.0 - 34.0 pg   MCHC 33.5 30.0 - 36.0 g/dL   RDW 12.9 11.5 - 15.5 %   Platelets 206 150 - 400 K/uL  CBC     Status: None   Collection Time: 01/31/16  5:08 AM  Result Value Ref Range   WBC 6.3 4.0 - 10.5 K/uL   RBC 4.24 4.22 - 5.81 MIL/uL   Hemoglobin 13.3 13.0 - 17.0 g/dL   HCT 39.8 39.0 - 52.0 %   MCV 93.9 78.0 - 100.0  fL   MCH 31.4 26.0 - 34.0 pg   MCHC 33.4 30.0 - 36.0 g/dL   RDW 12.8 11.5 - 15.5 %   Platelets 203 150 - 400 K/uL  Basic metabolic panel     Status: None   Collection Time: 01/31/16 10:52 AM  Result Value Ref Range   Sodium 140 135 - 145 mmol/L   Potassium 4.0 3.5 - 5.1 mmol/L   Chloride 107 101 - 111 mmol/L   CO2 25 22 - 32 mmol/L   Glucose, Bld 88 65 - 99 mg/dL   BUN 11 6 - 20 mg/dL   Creatinine, Ser 0.83 0.61 - 1.24 mg/dL   Calcium 9.2 8.9 - 10.3 mg/dL   GFR calc non  Af Amer >60 >60 mL/min   GFR calc Af Amer >60 >60 mL/min    Comment: (NOTE) The eGFR has been calculated using the CKD EPI equation. This calculation has not been validated in all clinical situations. eGFR's persistently <60 mL/min signify possible Chronic Kidney Disease.    Anion gap 8 5 - 15    Current Facility-Administered Medications  Medication Dose Route Frequency Provider Last Rate Last Dose  . aspirin EC tablet 81 mg  81 mg Oral Daily Alexa Angela Burke, MD   81 mg at 01/30/16 2200  . diltiazem (CARDIZEM CD) 24 hr capsule 240 mg  240 mg Oral Daily Alexa Angela Burke, MD   240 mg at 01/31/16 0753  . gabapentin (NEURONTIN) capsule 600 mg  600 mg Oral BID Alexa Angela Burke, MD   600 mg at 01/31/16 0753  . hydrOXYzine (ATARAX/VISTARIL) tablet 50 mg  50 mg Oral TID PRN Ambrose Finland, MD      . Derrill Memo ON 02/01/2016] losartan (COZAAR) tablet 100 mg  100 mg Oral Daily Bhavinkumar Bhagat, PA      . sodium chloride flush (NS) 0.9 % injection 3 mL  3 mL Intravenous Q12H Alexa R Quay Burow, MD   3 mL at 01/31/16 0852  . zolpidem (AMBIEN) tablet 5 mg  5 mg Oral QHS PRN Alexa Angela Burke, MD        Musculoskeletal: Strength & Muscle Tone: within normal limits no tremors, no diaphoresis, no acute distress  Gait & Station: normal- gait not examined  Patient leans: N/A  Psychiatric Specialty Exam: Physical Exam  ROS  No current chest pain, no shortness of breath  Blood pressure 138/82, pulse 77, temperature 98.6 F (37 C),  temperature source Oral, resp. rate (!) 22, height _0  (1.778 m), weight 180 lb 14.4 oz (82.1 kg), SpO2 97 %.Body mass index is 25.96 kg/m.  General Appearance: Well Groomed- in hospital garb   Eye Contact:  Good  Speech:  Normal Rate  Volume:  Normal- not pressured   Mood:  reports he is feeling better, denies significant depression at this time   Affect:  reactive, slightly irritable initially, but became fuller in range as session progressed   Thought Process:  Linear  Orientation:  Full (Time, Place, and Person)  Thought Content:  denies hallucinations, no delusions, not internally preoccupied   Suicidal Thoughts:  No patient denies any suicidal or self injurious ideations, denies any homicidal ideations , specifically has also denied any homicidal ideations towards his wife   Homicidal Thoughts:  No  Memory:  recent and remote grossly intact   Judgement:  Other:  has improved  Insight:  improving   Psychomotor Activity:  Normal  Concentration:  Concentration: Good and Attention Span: Good  Recall:  Good  Fund of Knowledge:  Good  Language:  Good  Akathisia:  Negative  Handed:  Right  AIMS (if indicated):     Assets:  Desire for Improvement Resilience  ADL's: improving   Cognition:  WNL  Sleep:   states he is sleeping well .    Assessment - at this time patient is alert, attentive, oriented x 3,  not suicidal, not homicidal, not psychotic, and behavior is calm, in good control. He states he is feeling better and reports a sense of regret regarding having made suicidal, threatening statements that led to admission, also stating he was simply trying to get wife's attention . Wife corroborates that patient is improved, closer to baseline, and that he  has remained calm, without any further SI , any HI, or any  Further threatening statements or behaviors . At this time both patient and wife are wanting to for him to discharge home .   Treatment Plan Summary: as below    Disposition: No evidence of imminent risk to self or others at present.     At this time patient does not meet criteria for involuntary commitment .   I think he may benefit from brief  inpatient psychiatric admission to focus on medication adjustments/management, and have discussed this option  with wife and him, but they prefer to be discharged home, and continue outpatient treatment options .  Patient may benefit from mood stabilizer treatment to address irritability, explosiveness- if medically appropriate would consider Depakote ER trial, start at 500 mgrs QDAY initially    I have reviewed the importance of further outpatient psychiatric and psychotherapy follow ups after discharge and have  recommended IOP ( Intensive Outpatient Treatment )  as an appropriate stepdown level of care, and they have both have expressed high level of interest. I have spoken with Otila Kluver, CSW in order to work on this disposition plan.  Discussed as above with Dr. Reesa Chew, Attending    Neita Garnet, MD 01/31/2016 2:39 PM

## 2016-01-31 NOTE — Progress Notes (Signed)
Nurse told tech/sitter to take off tele monitor and allow Pt to get dress for home. Per Zella Ballobin, RN

## 2016-01-31 NOTE — Progress Notes (Signed)
Patient Name: Jared [name] Date of Encounter: 01/31/2016  Primary Cardiologist: [facility] Problem List     Principal Problem:   Major depressive disorder, single episode, severe without psychotic features [facility]) Active Problems:   Intermittent explosive disorder   Atrial fibrillation with RVR ([facility])   PAF (paroxysmal atrial fibrillation) ([facility])   Chronic diastolic heart failure ([facility])   Subjective  Feeling well. No chest pain, sob or palpitations.   Inpatient Medications    Scheduled Meds: . aspirin EC  81 mg Oral Daily  . diltiazem  240 mg Oral Daily  . gabapentin  600 mg Oral BID  . [START ON 02/01/2016] losartan  100 mg Oral Daily  . sodium chloride flush  3 mL Intravenous Q12H   Continuous Infusions:   PRN Meds:.hydrOXYzine, zolpidem   Vital Signs    Vitals:   01/30/16 1635 01/30/16 2034 01/31/16 0544 01/31/16 0820  BP: (!) 142/88 (!) 136/91 140/87 (!) 147/96  Pulse: 72 95 69 95  Resp: 11 (!) 24 11 (!) 21  Temp: 98.7 F (37.1 C) 98 F (36.7 C) 98.5 F (36.9 C) 98.1 F (36.7 C)  TempSrc: Oral Oral Oral Oral  SpO2: 99% 99% 95% 99%  Weight:      Height:        Intake/Output Summary (Last 24 hours) at 01/31/16 0922 Last data filed at 01/31/16 0244  Gross per 24 hour  Intake              960 ml  Output             2525 ml  Net            -1565 ml   Filed Weights   01/28/16 1749 01/30/16 0532  Weight: 182 lb 8 oz (82.8 kg) 180 lb 14.4 oz (82.1 kg)    Physical Exam   GEN: Well nourished, well developed, in no acute distress.  HEENT: Grossly normal.  Neck: Supple, no JVD, carotid bruits, or masses. Cardiac: RRR, no murmurs, rubs, or gallops. No clubbing, cyanosis, edema.  Radials/DP/PT 2+ and equal bilaterally.  Respiratory:  Respirations regular and unlabored, clear to auscultation bilaterally. GI: Soft, nontender, nondistended, BS + x 4. MS: no deformity or atrophy. Skin: warm and dry, no rash. Neuro:  Strength and sensation are  intact. Psych: AAOx3.  Normal affect.  Labs    CBC  Recent Labs  01/30/16 0338 01/31/16 0508  WBC 6.0 6.3  HGB 13.2 13.3  HCT 39.4 39.8  MCV 94.0 93.9  PLT 206 203   Basic Metabolic Panel  Recent Labs  01/28/16 1105 01/28/16 1426 01/29/16 0326  NA  --  141 142  K  --  4.0 3.6  CL  --  113* 111  CO2  --  22 24  GLUCOSE  --  118* 113*  BUN  --  6 6  CREATININE  --  0.76 0.76  CALCIUM  --  8.9 8.8*  MG 1.7  --   --   PHOS 2.3*  --   --    Liver Function Tests  Recent Labs  01/28/16 1426 01/29/16 0326  AST 21 18  ALT 30 26  ALKPHOS 71 60  BILITOT 0.7 0.9  PROT 6.0* 5.5*  ALBUMIN 3.8 3.2*   No results for input(s): LIPASE, AMYLASE in the last 72 hours. Cardiac Enzymes No results for input(s): CKTOTAL, CKMB, CKMBINDEX, TROPONINI in the last 72 hours. BNP Invalid input(s): POCBNP D-Dimer No  results for input(s): DDIMER in the last 72 hours. Hemoglobin A1C No results for input(s): HGBA1C in the last 72 hours. Fasting Lipid Panel No results for input(s): CHOL, HDL, LDLCALC, TRIG, CHOLHDL, LDLDIRECT in the last 72 hours. Thyroid Function Tests No results for input(s): TSH, T4TOTAL, T3FREE, THYROIDAB in the last 72 hours.  Invalid input(s): FREET3  Telemetry     Sinus rhythm at rate of 80s  ECG    EKG 01/29/16:  Sinus rhythm with non specific TWI in lateral leads Vent. rate 88 BPM PR interval 148 ms QRS duration 76 ms QT/QTc 382/462 ms P-R-T axes 42 18 54  Radiology    No results found.   Cardiac Studies   Pending echo  Patient Profile     Jared [name] is a 55 y.o. male with past medical history of HTN, prior alcohol abuse, and severe depression with suicidal ideation who [facility] after threatening to kill himself and burn his home up with the pets inside cardiology is consulted for afib RVR  Echo 01/29/16 LV EF: 55% -    60%  ------------------------------------------------------------------- Indications:      [id number].  ------------------------------------------------------------------- History:   Risk factors:  Hypertension. Dyslipidemia.  ------------------------------------------------------------------- Study Conclusions  - Left ventricle: The cavity size was mildly dilated. Systolic   function was normal. The estimated ejection fraction was in the   range of 55% to 60%. Wall motion was normal; there were no   regional wall motion abnormalities. Features are consistent with   a pseudonormal left ventricular filling pattern, with concomitant   abnormal relaxation and increased filling pressure (grade 2   diastolic dysfunction). - Aortic valve: There was mild regurgitation. - Atrial septum: There was increased thickness of the septum,   consistent with lipomatous hypertrophy.  Assessment & Plan    1. Afib with RVR - [facility] started on Abilify and Trazodone. Syncope episode. Now found to have afib with RVR. TSH normal. Echo showed LV EF of 55-60%, no wm abnormality, grade 2 DD, mild AI, lipomatous hypertrophy of atrial septum.  converted sinus rhythm 01/29/16 @ 9:30am. Maintaining sinus rhythm. Rate in 80s.  -  cha2ds2vasc score is a 1 --> does not requires anticoagulation currently.   2. HTN - He was taking (amlodipine and benicar combination). Discontinue. Continue cardizem and losartan. BP still elevated, will increase further to 100mg . BMET today   3. Abnormal EKG  - Sinus rhythm with non specific TWI in lateral leads.  No chest pain. No work up needed. MD to review.    Manson PasseyBhagat,Bhavinkumar, [facility] 01/31/2016 9:22 AM   Patient seen and examined. Agree with assessment and plan. Feels well. No recurrent AF. Sinus rhythm in the 70s. Ok from cardiac perspective to dc today on Cardizem CD 240 mg and Losartan 100 mg daily. Renal fxn stable. Arrange for f/u in  office.   Lennette Biharihomas A. Kelly, MD, [facility] Medical CenterFACC 01/31/2016 10:40 AM

## 2016-01-31 NOTE — Progress Notes (Signed)
   Subjective: The patient was feeling full better. Denies any chest pain, palpitation or shortness of breath. Patient's wife states that they're getting a second opinion regarding his current mental status and going back to the behavioral health. He was supposed to be discharged to behavioral health. Patient and his wife do not want him to go back to that facility.  Objective:  Vital signs in last 24 hours: Vitals:   01/30/16 2034 01/31/16 0544 01/31/16 0820 01/31/16 1200  BP: (!) 136/91 140/87 (!) 147/96 138/82  Pulse: 95 69 95 77  Resp: (!) 24 11 (!) 21 (!) 22  Temp: 98 F (36.7 C) 98.5 F (36.9 C) 98.1 F (36.7 C) 98.6 F (37 C)  TempSrc: Oral Oral Oral Oral  SpO2: 99% 95% 99% 97%  Weight:      Height:       Gen.well-developed, well-nourished man, in no acute distress. Lungs.Clear bilaterally. CV. Regular rate and rhythm,no murmur/rub/gallop. Abdomen.Soft ,nontender, bowel sounds positive Extremities.No edema, no cyanosis, pulses 2+ bilaterally.   Assessment/Plan:  Mr. Jared ImusHannon 55 y.o man with past medical history significant for severe depression with suicidal ideation, HTN, HLDrecently admitted to behavioral health on 09/29/17after threatening that he will down his house along with himself and his dogs who presents to the ED after a syncopal episode.   New Onset Atrial Fibrillation with RVR. He is in sinus rhythm for the last 2 days now. With the rate in 70s on Cardizem 240 mg daily. He does not required a long-term anticoagulation due to low CHA2DS-VASc Score. -Continue Cardizem 240 mg daily  Hypertension.  his blood pressure remains slightly elevated in spite of starting losartan 50 mg yesterday. -Increase losartan 200 mg daily.  Major Depression with Suicidal Ideation. Although he is not actively suicidal and denies any plans. He does show some aggressive behavior during his hospital stay. During the cycle evaluation they were advised to continue inpatient  management. Patient and his wife both are against it and don't want to go back to that facility. They are trying to get a second opinion.  Dispo: Being discharged today.  Arnetha CourserSumayya Lilyan Prete, MD 01/31/2016, 1:09 PM Pager: [phone]

## 2016-01-31 NOTE — Plan of Care (Signed)
Problem: Education: Goal: Knowledge of [facility] Education information/materials will improve Outcome: Not Applicable Date Met: 68/87/37 Pt received education about medications, available resources, labs, and tests during this admission  Problem: Safety: Goal: Ability to remain free from injury will improve Outcome: Completed/Met Date Met: 01/31/16 Pt has remained free from injury during this admission   Problem: Physical Regulation: Goal: Will remain free from infection Outcome: Completed/Met Date Met: 01/31/16 Pt has remained free from infection   Problem: Fluid Volume: Goal: Ability to maintain a balanced intake and output will improve Outcome: Completed/Met Date Met: 01/31/16 Pt has adequate intake and output

## 2016-01-31 NOTE — Clinical Social Work Psych Assess (Signed)
Clinical Social Work Environmental manager Social Worker:  Elvis Coil Date/Time:  01/31/2016, 1:48 PM Referred By:  Physician Date Referred:  01/30/16 Reason for Referral:   (from behavioral health)   Presenting Symptoms/Problems Patient states he is at [facility] because he has a TIA.  Patient minimizes episode    Abuse/Neglect/Trauma History  Denies History  Psychiatric History  Denies History  Emotional Health/Current Symptoms  Suicide/Self Harm: None Reported (upon admission to [facility] (prior to [facility] admission) patinet baracaded himself inside house with gas cans in attenpts to harm self) Suicide Attempt in Past (date/description):  Prior to admission to [facility] the patient barricaded himself inside his home with 5-5 gallon gas cans all over the house and sent wife a video of him with the gas cans and states he was going to blow up the house with him and the pets inside.   Other Harmful Behavior (ex. homicidal ideation) (describe):  None known or disclosed.   Psychotic/Dissociative Symptoms None Reported  Cognitive Impairment  Cognitive Impairment:  Within Normal Limits  Mood and Adjustment Anxious (passive aggressive) Patient exhibits sx of personality d/o  Stress, Anxiety, Trauma, Any Recent Loss/Stressor  Stress, Anxiety, Trauma, Any Recent Loss/Stressor: Anxiety Anxiety (frequency):  Patient states he acted out (involved swat team) because wife made him angry though he cannot remember how or why he was angry.  Wife has no recollection of anything that could have set the patient off into the angry rage.  Substance Abuse/Use  Substance Abuse/Use: History of Substance Use (patient currently in [place] ) SBIRT Completed (please refer for detailed history): No Urinary Drug Screen Completed: Yes  Environment/Housing/Living Arrangement  Environmental/Housing/Living Arrangement: With Family Member Who is in the Home:  Wife and step  daughter  Financial  Financial: Multimedia programmer   Patient's Strengths and Goals  Patient's Strengths and Goals (patient's own words):  Patient has medical insurance and supportive wife at bedside.   Clinical Social Worker's Interpretive Summary  Clinical Social Workers Interpretive Summary:  Psych CSW met with both patient and wife Wednesday.  Total time for this assessment was over an hour and a half.  Patient and wife recollected every detail of the events leading up to the patient's admission at [facility].  Patient states he cannot remember and blames this episode on his newfound TIA.  Patient does remember that he was upset with his wife, though he cannot remember why.  Patient was sent to [facility] from [facility] where the patient was undergoing treatment and was IVC'd,  Patient displays symptoms of a personality disorder and during this assessment has exhibited passive agressiveness.  Patient's wife is advocating for patient to return home as she feels that he is at baseline.  Of note, the patient was so upset with his wife that he barricaided himself in his home surrounded himself with 5-5 gallon gas cans and sent a video to his wife of him wishing to blow the house up with himself and the pets inside.  This standoff with the swat team and negotiators lasted for almost 12 hours before the patient attempted to leave the house and was apprehended by police.  Patient states he has no recollection of these events.  Of note, the wife can recall each detail.  Wife and patient state the patient has no hx of MH neither inpatient nor outpatient.  Wife states the patient has never been "that angry" before and never "acted out" like that.  The wife and patient both  feel that something medically caused his display of anger.  Of note, the patient was admitted to [facility] where he presented labile, not psychotic and staff reports the patient stated upon arrival that he did all of this because he was upset with his wife and  wanted to get back at her.  Psych CSW did not receive this same report from the patient nor the wife during this assessment.    Of note, the patient and wife have raised concerns to the Nursing Administrator at [facility] regarding the "lack of concern for care" that he received when he felt "something was medically wrong".  Though patient is from [facility], patient states he was in the process of becoming discharged and wishes to be discharged home from [facility].  Psych reconsult has been placed for determination of continued need for inpatient psychiatric treatment.  Patient states he wishes to have a second opinion as he did not feel he was respected by the first evaluating psychiatrist.    Patient to be re-evaluated by psychiatry. If IP psych is recommended, the patient will need to be referred out to other facilities including [facility], but not including [facility] per [facility].     Disposition  Disposition: Inpatient Referral Made [facility] [place])

## 2016-02-11 ENCOUNTER — Encounter (HOSPITAL_COMMUNITY): Payer: Self-pay | Admitting: Psychiatry

## 2016-02-11 ENCOUNTER — Other Ambulatory Visit (HOSPITAL_COMMUNITY): Payer: BC Managed Care – PPO | Attending: Psychiatry | Admitting: Psychiatry

## 2016-02-11 DIAGNOSIS — I493 Ventricular premature depolarization: Secondary | ICD-10-CM | POA: Insufficient documentation

## 2016-02-11 DIAGNOSIS — Z79899 Other long term (current) drug therapy: Secondary | ICD-10-CM | POA: Insufficient documentation

## 2016-02-11 DIAGNOSIS — Z87891 Personal history of nicotine dependence: Secondary | ICD-10-CM | POA: Insufficient documentation

## 2016-02-11 DIAGNOSIS — Z7982 Long term (current) use of aspirin: Secondary | ICD-10-CM | POA: Diagnosis not present

## 2016-02-11 DIAGNOSIS — F419 Anxiety disorder, unspecified: Secondary | ICD-10-CM | POA: Diagnosis not present

## 2016-02-11 DIAGNOSIS — F331 Major depressive disorder, recurrent, moderate: Secondary | ICD-10-CM | POA: Insufficient documentation

## 2016-02-11 DIAGNOSIS — F341 Dysthymic disorder: Secondary | ICD-10-CM

## 2016-02-11 DIAGNOSIS — I4891 Unspecified atrial fibrillation: Secondary | ICD-10-CM | POA: Insufficient documentation

## 2016-02-11 DIAGNOSIS — I10 Essential (primary) hypertension: Secondary | ICD-10-CM | POA: Diagnosis not present
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
## 2016-02-12 ENCOUNTER — Encounter (HOSPITAL_COMMUNITY): Payer: Self-pay | Admitting: Psychiatry

## 2016-02-12 ENCOUNTER — Other Ambulatory Visit (HOSPITAL_COMMUNITY): Payer: BC Managed Care – PPO | Admitting: Psychiatry

## 2016-02-12 DIAGNOSIS — F331 Major depressive disorder, recurrent, moderate: Secondary | ICD-10-CM | POA: Insufficient documentation

## 2016-02-12 MED ORDER — GABAPENTIN 600 MG PO TABS: 600.0000 mg | ORAL_TABLET | Freq: Three times a day (TID) | ORAL | 2 refills | Status: DC
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
## 2016-02-13 ENCOUNTER — Other Ambulatory Visit (HOSPITAL_COMMUNITY): Payer: BC Managed Care – PPO | Admitting: Psychiatry

## 2016-02-13 DIAGNOSIS — F331 Major depressive disorder, recurrent, moderate: Secondary | ICD-10-CM | POA: Diagnosis not present

## 2016-02-14 ENCOUNTER — Telehealth (HOSPITAL_COMMUNITY): Payer: Self-pay | Admitting: Psychiatry

## 2016-02-14 ENCOUNTER — Other Ambulatory Visit (HOSPITAL_COMMUNITY): Payer: BC Managed Care – PPO | Admitting: Psychiatry

## 2016-02-14 NOTE — Progress Notes (Signed)
    Daily Group Progress Note  Program: IOP Group Time: 9:00-12:00   Participation Level:  minimal   Behavioral Response:  flat Type of Therapy:  group therapy   Summary of Progress:  Client was only briefly in the group, though he related to other clients who struggle with their teenage children. He saw the psychiatrist and the social worker. Pt. Participated in medication management group with the pharmacist.  Shaune PollackBrown, Joniqua Sidle B, [facility]

## 2016-02-15 ENCOUNTER — Ambulatory Visit (INDEPENDENT_AMBULATORY_CARE_PROVIDER_SITE_OTHER): Payer: BC Managed Care – PPO | Admitting: Physician Assistant

## 2016-02-15 ENCOUNTER — Other Ambulatory Visit (HOSPITAL_COMMUNITY): Payer: BC Managed Care – PPO | Admitting: Psychiatry

## 2016-02-15 ENCOUNTER — Encounter: Payer: Self-pay | Admitting: Physician Assistant

## 2016-02-15 VITALS — BP 150/90 | HR 73 | Ht 70.0 in | Wt 186.0 lb

## 2016-02-15 DIAGNOSIS — I10 Essential (primary) hypertension: Secondary | ICD-10-CM

## 2016-02-15 DIAGNOSIS — I480 Paroxysmal atrial fibrillation: Secondary | ICD-10-CM | POA: Diagnosis not present

## 2016-02-15 DIAGNOSIS — F329 Major depressive disorder, single episode, unspecified: Secondary | ICD-10-CM | POA: Diagnosis not present

## 2016-02-15 DIAGNOSIS — F331 Major depressive disorder, recurrent, moderate: Secondary | ICD-10-CM | POA: Diagnosis not present

## 2016-02-15 DIAGNOSIS — F32A Depression, unspecified: Secondary | ICD-10-CM

## 2016-02-15 NOTE — Progress Notes (Signed)
Cardiology Office Note    Date:  02/15/2016   ID:  Jared [name], DOB 01-09-1961, MRN [id number]  PCP:  No PCP Per Patient  Cardiologist:  Dr. Tresa Endo  Chief Complaint  Patient presents with  . Hospitalization Follow-up    seen for Dr. Tresa Endo, recent new atrial fibrillation    History of Present Illness:  Jared [name] is a 55 y.o. male with PMH of HTN, prior EtOH abuse, and severe depression with suicidal ideation that was recently admitted to behavioral health Hospital in September 2017 after threatening to kill himself per his home up with the pets inside. While he was admitted to behavioral health, he was started on Abilify and trazodone. Throughout the night, he felt very tired, he had a syncopal episode after going to the bathroom and lost consciousness for roughly 30 seconds. On 01/28/2016, he had episodes of dizziness and the developed palpitation. His heart rate was irregular, on ED visit, he was in atrial fibrillation. He reported history of PVCs but no known prior atrial flutter ablation in the past. He does have family history of CAD in his father and atrial fibrillation with subsequent CVA in his mother. He has been recovering from alcoholism for six-month prior to recent hospital visit. He was started on Cardizem drip. Given his CHA2DS2-Vasc score of 1, he likely did not need long-term systemic anticoagulation therapy if converted within 24-48 hours. He was started on Xarelto briefly, this was later changed to aspirin. He spontaneously converted on rate control therapy on 01/29/2016. Echocardiogram obtained on 01/29/2016 which showed EF 55-60%, grade 2 diastolic dysfunction, mild AR, increased thickness in the septum consistent with lipomatous hypertrophy.  Been doing well since he left the hospital. He has no palpitation. No chest pain or shortness breath. He has been compliant with his medication. He is under a lot of stress as he and his wife's relationship is deteriating. EKG today  shows normal sinus rhythm without recurrence of atrial fibrillation. His free T4 was normal on recent hospitalization. Echocardiogram was reassuring. He can follow-up with cardiology in 3 month.   Past Medical History:  Diagnosis Date  . Anxiety   . Hypertension   . PVC (premature ventricular contraction)     History reviewed. No pertinent surgical history.  Current Medications: Outpatient Medications Prior to Visit  Medication Sig Dispense Refill  . aspirin 81 MG tablet Take 162 mg by mouth daily.     Marland Kitchen diltiazem (CARDIZEM CD) 240 MG 24 hr capsule Take 1 capsule (240 mg total) by mouth daily. 30 capsule 0  . gabapentin (NEURONTIN) 600 MG tablet Take 1 tablet (600 mg total) by mouth 3 (three) times daily. 90 tablet 2  . Glucosamine HCl (GLUCOSAMINE PO) Take 1 tablet by mouth daily.    Marland Kitchen losartan (COZAAR) 100 MG tablet Take 1 tablet (100 mg total) by mouth daily. 30 tablet 0  . Melatonin 10 MG TABS Take 10 mg by mouth at bedtime.     No facility-administered medications prior to visit.      Allergies:   Review of patient's allergies indicates no known allergies.   Social History   Social History  . Marital status: Married    Spouse name: N/A  . Number of children: N/A  . Years of education: N/A   Social History Main Topics  . Smoking status: Former Games developer  . Smokeless tobacco: Former Neurosurgeon    Quit date: 01/28/1996  . Alcohol use No     Comment: Quit in  07/2015.  . Drug use: No  . Sexual activity: Yes    Birth control/ protection: None   Other Topics Concern  . None   Social History Narrative  . None     Family History:  The patient's family history includes Alcohol abuse in his paternal uncle; Heart attack in his father; Heart disease in his father and mother; Hypertension in his mother; Stroke in his mother.   ROS:   Please see the history of present illness.    ROS All other systems reviewed and are negative.   PHYSICAL EXAM:   VS:  BP (!) 150/90   Pulse 73    Ht 5\' 10"  (1.778 m)   Wt 186 lb (84.4 kg)   BMI 26.69 kg/m    GEN: Well nourished, well developed, in no acute distress  HEENT: normal  Neck: no JVD, carotid bruits, or masses Cardiac: RRR; no murmurs, rubs, or gallops,no edema  Respiratory:  clear to auscultation bilaterally, normal work of breathing GI: soft, nontender, nondistended, + BS MS: no deformity or atrophy  Skin: warm and dry, no rash Neuro:  Alert and Oriented x 3, Strength and sensation are intact Psych: euthymic mood, full affect  Wt Readings from Last 3 Encounters:  02/15/16 186 lb (84.4 kg)  01/30/16 180 lb 14.4 oz (82.1 kg)  01/28/16 185 lb (83.9 kg)      Studies/Labs Reviewed:   EKG:  EKG is ordered today.  The ekg ordered today demonstrates Normal sinus rhythm without significant ST-T wave changes.  Recent Labs: 01/28/2016: Magnesium 1.7; TSH 1.828 01/29/2016: ALT 26 01/31/2016: BUN 11; Creatinine, Ser 0.83; Hemoglobin 13.3; Platelets 203; Potassium 4.0; Sodium 140   Lipid Panel    Component Value Date/Time   CHOL 154 01/28/2016 0617   TRIG 198 (H) 01/28/2016 0617   HDL 38 (L) 01/28/2016 0617   CHOLHDL 4.1 01/28/2016 0617   VLDL 40 01/28/2016 0617   LDLCALC 76 01/28/2016 0617   LDLDIRECT 98.6 04/23/2010 0916    Additional studies/ records that were reviewed today include:   Echo 01/31/2016  LV EF: 55% -   60%  ------------------------------------------------------------------- Indications:      35218.  ------------------------------------------------------------------- History:   Risk factors:  Hypertension. Dyslipidemia.  ------------------------------------------------------------------- Study Conclusions  - Left ventricle: The cavity size was mildly dilated. Systolic   function was normal. The estimated ejection fraction was in the   range of 55% to 60%. Wall motion was normal; there were no   regional wall motion abnormalities. Features are consistent with   a pseudonormal left  ventricular filling pattern, with concomitant   abnormal relaxation and increased filling pressure (grade 2   diastolic dysfunction). - Aortic valve: There was mild regurgitation. - Atrial septum: There was increased thickness of the septum,   consistent with lipomatous hypertrophy.    ASSESSMENT:    1. Paroxysmal atrial fibrillation (HCC)   2. Essential hypertension   3. Depression, unspecified depression type      PLAN:  In order of problems listed above:  1. PAF on ASA  - rate controlled on diltiazem 240mg . He has not had any recurrence of atrial fibrillation. From the cardiac perspective he has been doing relatively well.  - This patients CHA2DS2-VASc Score and unadjusted Ischemic Stroke Rate (% per year) is equal to 0.6 % stroke rate/year from a score of 1  Above score calculated as 1 point each if present [CHF, HTN, DM, Vascular=MI/PAD/Aortic Plaque, Age if 65-74, or Male] Above score calculated  as 2 points each if present [Age > 75, or Stroke/TIA/TE]   2. HTN: His blood pressure is elevated at 150/90, however per patient, he is agitated today as he and his wife's relationship has been deteriorating. Otherwise his blood pressure at home is usually running in the 110s.  3. Depression: will need to continue followup with psychiatry given h/o psychiatric admission for suicidal ideation    Medication Adjustments/Labs and Tests Ordered: Current medicines are reviewed at length with the patient today.  Concerns regarding medicines are outlined above.  Medication changes, Labs and Tests ordered today are listed in the Patient Instructions below. Patient Instructions  Your physician recommends that you schedule a follow-up appointment in: 3 Months with Dr Tresa EndoKelly     Signed, Jared [name], [place]  02/15/2016 11:25 PM    [facility] [address], [place], [place]  [address] Phone: [phone]; Fax: [phone]

## 2016-02-15 NOTE — Patient Instructions (Signed)
Your physician recommends that you schedule a follow-up appointment in: 3 Months with Dr Kelly  
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
## 2016-02-18 ENCOUNTER — Other Ambulatory Visit (HOSPITAL_COMMUNITY): Payer: BC Managed Care – PPO | Admitting: Psychiatry

## 2016-02-18 DIAGNOSIS — F331 Major depressive disorder, recurrent, moderate: Secondary | ICD-10-CM | POA: Diagnosis not present

## 2016-02-19 ENCOUNTER — Telehealth (HOSPITAL_COMMUNITY): Payer: Self-pay | Admitting: Psychiatry

## 2016-02-19 ENCOUNTER — Other Ambulatory Visit (HOSPITAL_COMMUNITY): Payer: BC Managed Care – PPO | Admitting: Psychiatry

## 2016-02-19 NOTE — Progress Notes (Signed)
    Daily Group Progress Note  Program: IOP  Group Time: 9:00-12:00   Participation Level: active   Behavioral Response:  responsive   Type of Therapy:   group therapy   Summary of Progress:   Patient reported that his wife has left him for two weeks. He feels this situation is entirely his fault, but also would like his wife to take some responsibility in the situation. The group responded supportively, and reminded him that it takes two people to have a relationship, and likewise two people to destroy a relationship. Patient responded that he feels very negatively about himself, and referred to himself as an" a-hole". The group responded encouragingly, and supported the patient in reframing his view of himself. He fears being without his wife, largely because he feels unable to enjoy activities without her. Counselor explored patient's interests and asked him what one small thing he can do to occupy himself would be. Patient responded he would like to begin working out again. Patient identified strongly with psycho-ed around attachment styles.   Shaune PollackBrown, Jennifer B, LPC

## 2016-02-20 ENCOUNTER — Other Ambulatory Visit (HOSPITAL_COMMUNITY): Payer: BC Managed Care – PPO | Admitting: Psychiatry

## 2016-02-20 DIAGNOSIS — F331 Major depressive disorder, recurrent, moderate: Secondary | ICD-10-CM | POA: Diagnosis not present
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
## 2016-02-21 ENCOUNTER — Other Ambulatory Visit (HOSPITAL_COMMUNITY): Payer: BC Managed Care – PPO | Admitting: Psychiatry

## 2016-02-21 ENCOUNTER — Other Ambulatory Visit: Payer: Self-pay | Admitting: *Deleted

## 2016-02-21 DIAGNOSIS — F331 Major depressive disorder, recurrent, moderate: Secondary | ICD-10-CM | POA: Diagnosis not present

## 2016-02-22 ENCOUNTER — Other Ambulatory Visit (HOSPITAL_COMMUNITY): Payer: BC Managed Care – PPO | Admitting: Psychiatry

## 2016-02-22 DIAGNOSIS — F331 Major depressive disorder, recurrent, moderate: Secondary | ICD-10-CM | POA: Diagnosis not present

## 2016-02-22 MED ORDER — LOSARTAN POTASSIUM 100 MG PO TABS: 100.0000 mg | ORAL_TABLET | Freq: Every day | ORAL | 10 refills | Status: DC

## 2016-02-22 MED ORDER — DILTIAZEM HCL ER COATED BEADS 240 MG PO CP24: 240.0000 mg | ORAL_CAPSULE | Freq: Every day | ORAL | 10 refills | Status: DC

## 2016-02-22 NOTE — Telephone Encounter (Signed)
Rx(s) sent to pharmacy electronically.  

## 2016-02-25 ENCOUNTER — Other Ambulatory Visit (HOSPITAL_COMMUNITY): Payer: BC Managed Care – PPO | Admitting: Psychiatry

## 2016-02-25 DIAGNOSIS — F331 Major depressive disorder, recurrent, moderate: Secondary | ICD-10-CM | POA: Diagnosis not present

## 2016-02-25 NOTE — Progress Notes (Signed)
    Daily Group Progress Note  Program: IOP  Group Time: 9:00-12:00   Participation Level:active   Behavioral Response: responsive   Type of Therapy:   group therapy   Summary of Progress: Patient related to another group member around struggling with separation anxiety. Since his wife has been away on their relationship break the patient has reported really struggling to be alone. He said that yesterday was a very bad day for him. However, he was unable to share more as he left group early to go into work.   Shaune PollackBrown, Tatiyana Foucher B, LPC

## 2016-02-25 NOTE — Progress Notes (Signed)
    Daily Group Progress Note  Program: IOP  Group Time: 9:00-12:00   Participation Level:  active   Behavioral Response:engaged   Type of Therapy:  group therapy   Summary of Progress:  Patient has felt very frustrated with his wife's family, and has since cut off communication with them. He feels this is the best for now. He continues to feel hopeful that his relationship with his wife will work out, though he is frustrated by the distance. He acknowledges he has to work on himself to make the relationship better.   Shaune PollackBrown, Jennifer B, LPC

## 2016-02-26 ENCOUNTER — Other Ambulatory Visit (HOSPITAL_COMMUNITY): Payer: BC Managed Care – PPO | Admitting: Psychiatry

## 2016-02-26 DIAGNOSIS — F331 Major depressive disorder, recurrent, moderate: Secondary | ICD-10-CM | POA: Diagnosis not present

## 2016-02-26 NOTE — Progress Notes (Signed)
Patient ID: Jared [name], male   DOB: Mar 23, 1961, 55 y.o.   MRN: [id number] [facility] IOP DISCHARGE NOTE  Patient:  Jared [name] DOB:  Jan 20, 1961  Date of Admission: 02/11/2016  Date of Discharge: 02/26/2016  Reason for Admission:depression and temper outbursts  IOP Course:[name] attended and participated.  By the time of discharge he was feeling less depressed and having fewer outbursts mainly directed towards his wife.  He has met with his new therapist and finds her very helpful to him.  Mental Status at Discharge:no suicidal thoughts  Diagnosis: major depression, recurrent moderate  Level of Care:  IOP  Discharge destination:follow up with Dr Modesta Messing and Criss Alvine [facility]    Follow-up recommendations:  none  Comments:  Still recommend evaluation for central auditory processing disorder  The patient received suicide prevention pamphlet:  Yes   Donnelly Angelica, MD

## 2016-02-26 NOTE — Patient Instructions (Signed)
D:  Patient completed MH-IOP today.  A:  Follow up with Dr. Vanetta ShawlHisada on 02-27-16 @ 1 pm and Verlan FriendsSara Young, [facility].  Encouraged support groups.  R:  Pt receptive.
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
## 2016-02-26 NOTE — Progress Notes (Signed)
    Daily Group Progress Note  Program: IOP  Group Time: 9:00-12:00  Participation Level: Active  Behavioral Response: Appropriate  Type of Therapy:  Group Therapy  Summary of Progress: Pt. Continues to present as alert and talkative. Reports that he is doing well, managing his anger, recognizing patterns of insecure attachment that cause him to appear threatening and aggressive in his relationships. Pt. Reports that he is learning from the AA Big Book and developing insight about his behavior that he was not open to previously in his addiction recovery.     Shaune PollackBrown, Jennifer B, LPC

## 2016-02-27 ENCOUNTER — Other Ambulatory Visit (HOSPITAL_COMMUNITY): Payer: BC Managed Care – PPO

## 2016-02-27 ENCOUNTER — Telehealth: Payer: Self-pay | Admitting: Cardiovascular Disease

## 2016-02-27 ENCOUNTER — Encounter (HOSPITAL_COMMUNITY): Payer: Self-pay | Admitting: Psychiatry

## 2016-02-27 ENCOUNTER — Ambulatory Visit (INDEPENDENT_AMBULATORY_CARE_PROVIDER_SITE_OTHER): Payer: BC Managed Care – PPO | Admitting: Psychiatry

## 2016-02-27 VITALS — BP 148/98 | HR 86 | Ht 70.0 in | Wt 187.0 lb

## 2016-02-27 DIAGNOSIS — Z823 Family history of stroke: Secondary | ICD-10-CM | POA: Diagnosis not present

## 2016-02-27 DIAGNOSIS — F322 Major depressive disorder, single episode, severe without psychotic features: Secondary | ICD-10-CM

## 2016-02-27 DIAGNOSIS — Z8249 Family history of ischemic heart disease and other diseases of the circulatory system: Secondary | ICD-10-CM | POA: Diagnosis not present

## 2016-02-27 DIAGNOSIS — Z87891 Personal history of nicotine dependence: Secondary | ICD-10-CM

## 2016-02-27 DIAGNOSIS — Z79899 Other long term (current) drug therapy: Secondary | ICD-10-CM

## 2016-02-27 DIAGNOSIS — F6381 Intermittent explosive disorder: Secondary | ICD-10-CM | POA: Diagnosis not present

## 2016-02-27 DIAGNOSIS — Z811 Family history of alcohol abuse and dependence: Secondary | ICD-10-CM

## 2016-02-27 NOTE — Telephone Encounter (Signed)
New Message  Pt c/o medication issue:  1. Name of Medication:  Losartan diltiazem  2. How are you currently taking this medication (dosage and times per day)?  losartan 100 mg tablets once daily diltiazem 240 mg tablet once daily  3. Are you having a reaction (difficulty breathing--STAT)? no  4. What is your medication issue?  pt voiced the medication he is on is not controlling his BP 158/98 173/99

## 2016-02-27 NOTE — Patient Instructions (Addendum)
1. Continue gabapentin 300 mg three times a day 2. Return to clinic in one month

## 2016-02-27 NOTE — Progress Notes (Signed)
    Daily Group Progress Note  Program: IOP Group Time: 9:00-12:00   Participation Level: active  Behavioral Response:  engaged   Type of Therapy:  group therapy   Summary of Progress: Patient reported his relationship with his wife continues to be rocky, though it has stabilized at present. He feels strongly that he needs to change, and wants to keep working on himself so he can become a better man, and treat her better.  Shaune PollackBrown, Reya Aurich B, LPC

## 2016-02-27 NOTE — Telephone Encounter (Signed)
Phone rings w/ no answer or VM pickup. 

## 2016-02-28 ENCOUNTER — Other Ambulatory Visit (HOSPITAL_COMMUNITY): Payer: BC Managed Care – PPO

## 2016-02-28 MED ORDER — OLMESARTAN MEDOXOMIL 40 MG PO TABS: 40.0000 mg | ORAL_TABLET | Freq: Every day | ORAL | 3 refills | Status: DC

## 2016-02-28 NOTE — Telephone Encounter (Signed)
Unable to connect when dialed. 

## 2016-02-28 NOTE — Telephone Encounter (Signed)
Recommendations communicated, pt voiced understanding of med change instructions, will follow up if new concerns or BPs do not improve. Rx called to pharmacy.

## 2016-02-28 NOTE — Telephone Encounter (Signed)
Losartan tends to be a little less potent on pressures than olmesartan. Lets try putting him back on the olmesartan component of Azor (olmesartan 40mg  daily) instead of losartan. Continue dilt as prescribed. Continue to monitor and call with further issues.

## 2016-02-28 NOTE — Telephone Encounter (Signed)
Pt calling regarding BP elevations. He had recent med changes when seen here in office, was taken off Azor and put on cardizem and losartan. Aware that the rationale for changing medications had to do w better management of his A Fib.  Patient noted 158/98 yesterday at PCP office  Pt became very anxious about his BP. Took 12 readings at home last night:  156/89 162/88 169/106 155/103 159/94 171/101 149/96 158/94 155/91 153/96 160/102 188/116  Patient states he tried meditation and 2 Xanax to help w anxiety.  Notes BP this AM at home: 140/80 147/84  Patient would like recommendations for medication adjustments. He does not have trends for previous days. Patient states his BP was better controlled prior to med changes on 10/20 and was typically more around 120/75.

## 2016-02-29 ENCOUNTER — Other Ambulatory Visit (HOSPITAL_COMMUNITY): Payer: BC Managed Care – PPO

## 2016-02-29 NOTE — Progress Notes (Signed)
    Daily Group Progress Note  Program: IOP  Group Time: 9:00-12:00   Participation Level: active   Behavioral Response: engaged   Type of Therapy:   group therapy   Summary of Progress: Patient was alert and active during group.  Counselor and group members discussed positive psychology after watching a "The Positive Advantage" TedTalk.  Patient stated he would try some techniques provided in the video.  Counselor and group members discussed what image is portrayed on the outside versus what is felt on the inside.  Patient discussed how what he shows is "good" and what is on the inside is "bad" and how some stuff was contradictory.  He stated he knew some of the stuff he feels on the inside is incorrect and continues to remind himself and use techniques to battle these negative thoughts and images of himself.  Counselor and group discussed "levels" of sharing and trust.  Patient shared how PMR has helped him handle anxiety and stress.  Patient spoke with case manager and prepared for discharge.  Shaune PollackBrown, Ahmed Inniss B, LPC

## 2016-03-03 ENCOUNTER — Other Ambulatory Visit (HOSPITAL_COMMUNITY): Payer: BC Managed Care – PPO

## 2016-03-05 ENCOUNTER — Telehealth (HOSPITAL_COMMUNITY): Payer: Self-pay

## 2016-03-05 MED ORDER — SERTRALINE HCL 50 MG PO TABS: ORAL_TABLET | ORAL | 2 refills | Status: DC

## 2016-03-05 NOTE — Telephone Encounter (Signed)
He can be started on sertraline 50 mg daily for two weeks, then 100 mg daily. Will not prescribe any benzodiazepine at this time given his history of alcohol use.

## 2016-03-05 NOTE — Telephone Encounter (Signed)
I called patient and left a voicemail letting him know the Zoloft had been sent to the drug store and the Xanax was denied

## 2016-03-05 NOTE — Telephone Encounter (Signed)
Patient is calling for Zoloft. He states that you had recommended it at his appointment, but at that time he did not want to take it - however he has been taking his dads 75 mg tablets and it seems to be working, so he would like his own prescription. He also said that his blood pressure has been high and he took his dads .25 mg xanax and that seemed to really help, so he would like a prescription for that as well. I did call patient to advise that he will need to go to his PCP for his blood pressure, however I got his voicemail and I just left a message asking him to call me back. Please advise on the Zoloft, thank you.

## 2016-05-26 ENCOUNTER — Ambulatory Visit: Payer: BC Managed Care – PPO | Attending: Psychiatry | Admitting: Audiology

## 2016-05-26 DIAGNOSIS — H903 Sensorineural hearing loss, bilateral: Secondary | ICD-10-CM | POA: Insufficient documentation

## 2016-05-26 DIAGNOSIS — Z01118 Encounter for examination of ears and hearing with other abnormal findings: Secondary | ICD-10-CM | POA: Diagnosis present

## 2016-05-26 DIAGNOSIS — H918X9 Other specified hearing loss, unspecified ear: Secondary | ICD-10-CM | POA: Diagnosis not present

## 2016-05-26 DIAGNOSIS — R94128 Abnormal results of other function studies of ear and other special senses: Secondary | ICD-10-CM

## 2016-05-26 DIAGNOSIS — H93299 Other abnormal auditory perceptions, unspecified ear: Secondary | ICD-10-CM | POA: Insufficient documentation

## 2016-05-26 DIAGNOSIS — H9190 Unspecified hearing loss, unspecified ear: Secondary | ICD-10-CM

## 2016-05-26 NOTE — Procedures (Signed)
Outpatient Audiology and [name] [address] [place], [place]  [address] [phone]  AUDIOLOGICAL  EVALUATION  NAME: Jared [name]  StATUS: Outpatient DOB:   30-Sep-1960   DIAGNOSIS: Evaluate for Central auditory                                                                                    processing disorder                       MRN: [id number]                                                                                      DATE: 05/26/2016   REFERENT: Dr. Carolanne Grumbling                                                                                   Behavioral Health  HISTORY: Jared [name],  Was scheduled for an auditory processing evaluation, however, once hearing loss was identified, Jared [name] was only seen for an audiological today. Jared [name] remembers "looking out of the window at school a lot" -states could read and write fine, but remembers in elementary, middle and high school "not doing much" but graduated. Went to college, but Jared [name] and "finally got a degree in general studies" from [place].  Currently works as a Adult nurse.   Primary Concern: Trouble hearing in background noise, auditory processing and family complains that he "can't hearing". Sound sensitivity? Has trouble hearing stuff with background noise. Not sound sensitive. Other concerns? Needs to cut the tags out of clothing, likes to have sheets "nice and smooth".  Aversion to tactile issues such as a frayed milk carton container. States is frustrated easily,  History of hearing problems: Needs to have people "repeat", family complains "can't hear". History of ear infections: Y with "tubes" in ears when little. History of dizziness: N History of balance issues: N Significant medical history: Born 6 weeks premature and a c-section.  Family history of hearing loss:  N  EVALUATION: Pure tone air conduction testing showed hearing thresholds of 30 dBHL at 250Hz ; 35-45  dBHL from 500Hz  - 8000Hz  bilaterally. The hearing loss appears sensorineural bilaterally. Speech reception thresholds are 40/35 dBHL on the left and 40 dBHL on the right using recorded spondee word lists. Word recognition was 100% at 75 dBHL on the left at and 80 dBHL on the right using recorded NU-6 word lists, in quiet.  Otoscopic inspection reveals clear ear canals with visible  tympanic membranes.  Tympanometry showed normal middle ear volume and pressure bilaterally with normal tympanogram configuration on the right (Type A) with abnormal tympanogram configuration on the left - which needs further evaluation by an ENT.   Modified Khalfa Hyperacusis Handicap Questionnaire was completed. Jared [name] does not report the loudness of sound bothers him, but background noise does.  The Score for each subscale is Functional 9; Social 2; Emotional 5 . Jared [name] scored 16 which is MILD on the Loudness Sensitivity Handicap Scale.  Jared [name] Jared [name] states that he has "trouble reading in a noisy or loud environment" and that "stress and tiredness reduces his ability to concentrate in noise".    Speech-in-Noise testing was performed to determine speech discrimination in the presence of background noise.  Jared [name] scored 68% in each ear, when noise was presented 5 dB below speech. Jared [name] is expected to have significant difficulty hearing and understanding in minimal background noise.        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 This amount of hearing loss will adversely affect communication.  A hearing aid evaluation is recommended.  Middle ear function in normal on the right side, but the left side has an unusual middle ear configuration that needs further evaluation by an ENT, even though it may be an artifact of the "tube" history.  Word recognition is excellent in quiet at loud levels  but drops to poor in each ear in  minimal background noise.  Reduced Word Recognition in Minimal Background Noise is the inability to hear in the presence of competing noise. This problem may be easily mistaken for inattention.  Hearing may be excellent in a quiet room but become very poor when a fan, air conditioner or heater come on, paper is rattled or music is turned on. The background noise does not have to "sound loud" to a normal listener in order for it to be a problem..     RECOMMENDATIONS: 1.   Monitor hearing closely with a repeat audiological evaluation in 3-6 months (earlier if there is any change in hearing or ear pressure).  This appointment may be completed at the ENT office or audiologist. 2.   Referral to an Ear, Nose and Throat physician because of the bilateral hearing loss and abnormal middle ear configuration on the left side.  3.   A hearing aid evaluation.  This may be completed at the ENT office, through [facility] (works with a program that may provide one free hearing aid) and/or contact [facility] [employer] for hearing aid purchase assistance (Tel# [phone]).  Amplification helps make the signal louder and therefore often improves hearing and word recognition.  Amplification has many forms including hearing aids in one or both ears, an assistive listening device which have a microphone and speaker such as a small handheld device and/or even a surround sound system of speakers.  Amplification may be covered by some insurances, but not all.  It is important to note that hearing aids must be individually fit according to the hearing test results and the ear shape.  Audiologists and hearing aid dealers in [place] must be licensed in order to dispense hearing aids.  In addition, a trial period is mandated by law in our state because often amplification must be tried and then evaluated in order to determine benefit.       There are many excellent choices when it comes to amplification  in our  area and providers are listed in the phone book under hearing aids, there are audiologists in private practice, those affiliated with Ear, Nose and Throat physicians, and there are audiologists located at [facility] [employer] [facility]. 4.  Strategies that help improve hearing include: A) Face the speaker directly. Optimal is having the speakers face well - lit.  Unless amplified, being within 3-6 feet of the speaker will enhance word recognition. B) Avoid having the speaker back-lit as this will minimize the ability to use cues from lip-reading, facial expression and gestures. C)  Word recognition is poorer in background noise. For optimal word recognition, turn off the TV, radio or noisy fan when engaging in conversation. In a restaurant, try to sit away from noise sources and close to the primary speaker.  D)  Ask for topic clarification from time to time in order to remain in the conversation.  Most people don't mind repeating or clarifying a point when asked.  If needed, explain the difficulty hearing in background noise or hearing loss. 5.   Use hearing protection during noisy activities such as using a weed eater, moving the lawn, shooting, etc.    Musician's plugs, are available from [employer] for music related hearing protection because there is no distortion.  Other hearing protection, such as sponge plugs (available at pharmacies) or earmuffs (available at sporting goods stores or department stores such as [employer]) are useful for noisy activities and venues.  Total face to face contact time 45 minutes time followed by report writing.   Graceyn Fodor L. Kate Sable, AuD, CCC-A 05/26/2016  cc: Dwana Melena, MD

## 2016-05-29 ENCOUNTER — Encounter: Payer: Self-pay | Admitting: Cardiovascular Disease

## 2016-05-29 ENCOUNTER — Ambulatory Visit (INDEPENDENT_AMBULATORY_CARE_PROVIDER_SITE_OTHER): Payer: BC Managed Care – PPO | Admitting: Cardiovascular Disease

## 2016-05-29 VITALS — BP 152/84 | HR 53 | Ht 70.0 in | Wt 196.0 lb

## 2016-05-29 DIAGNOSIS — I10 Essential (primary) hypertension: Secondary | ICD-10-CM

## 2016-05-29 DIAGNOSIS — F32A Depression, unspecified: Secondary | ICD-10-CM

## 2016-05-29 DIAGNOSIS — I480 Paroxysmal atrial fibrillation: Secondary | ICD-10-CM | POA: Diagnosis not present

## 2016-05-29 DIAGNOSIS — F329 Major depressive disorder, single episode, unspecified: Secondary | ICD-10-CM

## 2016-05-29 NOTE — Patient Instructions (Signed)
Your physician wants you to follow-up in: 1 year or sooner if needed. You will receive a reminder letter in the mail two months in advance. If you don't receive a letter, please call our office to schedule the follow-up appointment.   If you need a refill on your cardiac medications before your next appointment, please call your pharmacy.   
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
## 2016-07-07 ENCOUNTER — Ambulatory Visit (INDEPENDENT_AMBULATORY_CARE_PROVIDER_SITE_OTHER): Payer: Self-pay | Admitting: Otolaryngology

## 2016-07-31 ENCOUNTER — Ambulatory Visit (INDEPENDENT_AMBULATORY_CARE_PROVIDER_SITE_OTHER): Payer: BC Managed Care – PPO | Admitting: Otolaryngology

## 2016-07-31 DIAGNOSIS — H903 Sensorineural hearing loss, bilateral: Secondary | ICD-10-CM | POA: Diagnosis not present

## 2017-02-06 ENCOUNTER — Other Ambulatory Visit: Payer: Self-pay | Admitting: Cardiovascular Disease

## 2017-02-06 NOTE — Telephone Encounter (Signed)
REFILL 

## 2017-03-02 ENCOUNTER — Other Ambulatory Visit: Payer: Self-pay | Admitting: Cardiovascular Disease

## 2017-03-02 NOTE — Telephone Encounter (Signed)
REFILL 

## 2017-05-06 ENCOUNTER — Other Ambulatory Visit: Payer: Self-pay | Admitting: Cardiovascular Disease

## 2017-08-04 ENCOUNTER — Other Ambulatory Visit: Payer: Self-pay | Admitting: Cardiovascular Disease

## 2017-08-04 NOTE — Telephone Encounter (Signed)
REFILL 

## 2017-08-13 ENCOUNTER — Encounter (HOSPITAL_COMMUNITY): Payer: Self-pay | Admitting: Emergency Medicine

## 2017-08-13 ENCOUNTER — Other Ambulatory Visit: Payer: Self-pay

## 2017-08-13 ENCOUNTER — Emergency Department (HOSPITAL_COMMUNITY)
Admission: EM | Admit: 2017-08-13 | Discharge: 2017-08-13 | Disposition: A | Discharge status: Home / Self Care | Payer: BC Managed Care – PPO | Attending: Emergency Medicine | Admitting: Emergency Medicine

## 2017-08-13 DIAGNOSIS — Y999 Unspecified external cause status: Secondary | ICD-10-CM | POA: Diagnosis not present

## 2017-08-13 DIAGNOSIS — Y9389 Activity, other specified: Secondary | ICD-10-CM | POA: Diagnosis not present

## 2017-08-13 DIAGNOSIS — Z7982 Long term (current) use of aspirin: Secondary | ICD-10-CM | POA: Insufficient documentation

## 2017-08-13 DIAGNOSIS — I5032 Chronic diastolic (congestive) heart failure: Secondary | ICD-10-CM | POA: Diagnosis not present

## 2017-08-13 DIAGNOSIS — W540XXA Bitten by dog, initial encounter: Secondary | ICD-10-CM | POA: Diagnosis not present

## 2017-08-13 DIAGNOSIS — Z87891 Personal history of nicotine dependence: Secondary | ICD-10-CM | POA: Diagnosis not present

## 2017-08-13 DIAGNOSIS — I110 Hypertensive heart disease with heart failure: Secondary | ICD-10-CM | POA: Insufficient documentation

## 2017-08-13 DIAGNOSIS — Y929 Unspecified place or not applicable: Secondary | ICD-10-CM | POA: Insufficient documentation

## 2017-08-13 DIAGNOSIS — Z79899 Other long term (current) drug therapy: Secondary | ICD-10-CM | POA: Diagnosis not present

## 2017-08-13 DIAGNOSIS — S61412A Laceration without foreign body of left hand, initial encounter: Secondary | ICD-10-CM | POA: Diagnosis not present

## 2017-08-13 DIAGNOSIS — S6992XA Unspecified injury of left wrist, hand and finger(s), initial encounter: Secondary | ICD-10-CM | POA: Diagnosis present

## 2017-08-13 MED ORDER — AMOXICILLIN-POT CLAVULANATE 875-125 MG PO TABS
1.0000 | ORAL_TABLET | Freq: Once | ORAL | Status: AC
  Administered 2017-08-13: 1 via ORAL
  Filled 2017-08-13: qty 1

## 2017-08-13 MED ORDER — AMOXICILLIN-POT CLAVULANATE 875-125 MG PO TABS: 1.0000 | ORAL_TABLET | Freq: Two times a day (BID) | ORAL | 0 refills | Status: DC

## 2017-08-13 MED ORDER — LIDOCAINE HCL (PF) 1 % IJ SOLN
5.0000 mL | Freq: Once | INTRAMUSCULAR | Status: AC
  Administered 2017-08-13: 5 mL via INTRADERMAL
  Filled 2017-08-13: qty 30

## 2017-08-13 NOTE — ED Notes (Signed)
Pt has proof of rabies cert

## 2017-08-13 NOTE — Discharge Instructions (Addendum)
Take the prescribed medication as directed.  Keep wound clean with soap and warm water, be sure to cover when working. Follow-up with your doctor to have stitches taken out in 7-10 days. Return to the ED for new or worsening symptoms.

## 2017-08-13 NOTE — ED Provider Notes (Signed)
[facility]-EMERGENCY DEPT Provider Note   CSN: [id number] Arrival date & time: 08/13/17  0058     History   Chief Complaint Chief Complaint  Patient presents with  . Animal Bite    HPI Jared [name] is a 57 y.o. male.  The history is provided by the patient and medical records.  Animal Bite     57 year old male with history of anxiety, hypertension, paroxysmal A. fib, presenting to the ED with dog bite to left hand.  States he was bitten by his own daschund puppy while playing with him.  Has gaping wound to webbed space between left thumb and index finger.  Tetanus is UTD.  Dog also UTD on vaccinations.  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  Past Medical History:  Diagnosis Date  . Anxiety   . Hypertension   . PVC (premature ventricular contraction)     Patient Active Problem List   Diagnosis Date Noted  . Major depressive disorder, recurrent episode, moderate (HCC) 02/12/2016  . PAF (paroxysmal atrial fibrillation) (HCC)   . Chronic diastolic heart failure (HCC)   . Atrial fibrillation with RVR (HCC) 01/28/2016  . Major depressive disorder 01/25/2016  . Intermittent explosive disorder 01/24/2016  . Major depressive disorder, single episode, severe without psychotic features (HCC)   . Alcohol abuse 05/20/2014  . Depression 05/20/2014  . Suicide attempt (HCC) 05/20/2014  . Suicidal ideations   . INGUINAL HERNIA 10/12/2009  . HEMORRHOIDS, WITH BLEEDING 09/28/2008  . HLD (hyperlipidemia) 08/23/2008  . ERECTILE DYSFUNCTION 07/26/2008  . Essential hypertension 07/26/2008  . ALLERGIC RHINITIS 07/26/2008  . GERD 07/26/2008  . PEPTIC ULCER DISEASE 07/26/2008  . NONSPECIFIC ABNORMAL ELECTROCARDIOGRAM 07/26/2008    History reviewed. No pertinent surgical history.      Home Medications    Prior to Admission medications   Medication Sig Start Date End Date Taking? Authorizing Provider  aspirin 81 MG tablet Take  162 mg by mouth daily.     [provider]  diltiazem (CARDIZEM CD) 240 MG 24 hr capsule Take 1 capsule (240 mg total) by mouth daily. KEEP OV. 08/04/17   Lennette BihariKelly, Thomas A, MD  gabapentin (NEURONTIN) 600 MG tablet Take 1 tablet (600 mg total) by mouth 3 (three) times daily. 02/12/16 02/11/17  Benjaman Pottaylor, Gerald D, MD  Glucosamine HCl (GLUCOSAMINE PO) Take 1 tablet by mouth daily.    [provider]  Melatonin 10 MG TABS Take 10 mg by mouth at bedtime.    [provider]  olmesartan (BENICAR) 40 MG tablet TAKE ONE TABLET EACH DAY 03/02/17   Lennette BihariKelly, Thomas A, MD  sertraline (ZOLOFT) 50 MG tablet 1 po qd for 2 weeks and then increase to 2 po qd 03/05/16   Neysa HotterHisada, Reina, MD    Family History Family History  Problem Relation Age of Onset  . Heart disease Mother   . Hypertension Mother   . Stroke Mother   . Heart disease Father   . Heart attack Father   . Alcohol abuse Paternal Uncle     Social History Social History   Tobacco Use  . Smoking status: Former Games developermoker  . Smokeless tobacco: Former NeurosurgeonUser    Quit date: 01/28/1996  Substance Use Topics  . Alcohol use: No    Alcohol/week: 10.8 oz    Types: 4 Glasses of wine, 14 Standard drinks or equivalent per week    Comment: Quit in 07/2015.  . Drug use: No  Allergies   Patient has no known allergies.   Review of Systems Review of Systems  Skin: Positive for wound.  All other systems reviewed and are negative.    Physical Exam Updated Vital Signs BP (!) 178/113 (BP Location: Right Arm)   Pulse (!) 106   Temp 98 F (36.7 C) (Oral)   Resp 18   SpO2 100%   Physical Exam  Constitutional: He is oriented to person, place, and time. He appears well-developed and well-nourished.  HENT:  Head: Normocephalic and atraumatic.  Mouth/Throat: Oropharynx is clear and moist.  Eyes: Pupils are equal, round, and reactive to light. Conjunctivae and EOM are normal.  Neck: Normal range of motion.  Cardiovascular: Normal  rate, regular rhythm and normal heart sounds.  Pulmonary/Chest: Effort normal and breath sounds normal. No stridor. No respiratory distress.  Abdominal: Soft. Bowel sounds are normal. There is no tenderness. There is no rebound.  Musculoskeletal: Normal range of motion.  Gaping 3cm square-shaped laceration in webbed space between left thumb and index finger; wound is external to fascia without deep tissue, vessel, or tendon involvement; moving all fingers normally Small abrasion to left index finger  Neurological: He is alert and oriented to person, place, and time.  Skin: Skin is warm and dry.  Psychiatric: He has a normal mood and affect.  Nursing note and vitals reviewed.    ED Treatments / Results  Labs (all labs ordered are listed, but only abnormal results are displayed) Labs Reviewed - No data to display  EKG None  Radiology No results found.  Procedures Procedures (including critical care time)  LACERATION REPAIR Performed by: Garlon Hatchet Authorized by: Garlon Hatchet Consent: Verbal consent obtained. Risks and benefits: risks, benefits and alternatives were discussed Consent given by: patient Patient identity confirmed: provided demographic data Prepped and Draped in normal sterile fashion Wound explored  Laceration Location: webbed space between left thumb and index finger  Laceration Length: 3cm, square shaped  No Foreign Bodies seen or palpated  Anesthesia: local infiltration  Local anesthetic: lidocaine 1% without epinephrine  Anesthetic total: 5 ml  Irrigation method: syringe Amount of cleaning: standard  Skin closure: 4-0 prolene  Number of sutures: 5  Technique: simple interrupted  Patient tolerance: Patient tolerated the procedure well with no immediate complications.   Medications Ordered in ED Medications  lidocaine (PF) (XYLOCAINE) 1 % injection 5 mL (5 mLs Intradermal Given by Other 08/13/17 0439)  amoxicillin-clavulanate  (AUGMENTIN) 875-125 MG per tablet 1 tablet (1 tablet Oral Given 08/13/17 0439)     Initial Impression / Assessment and Plan / ED Course  I have reviewed the triage vital signs and the nursing notes.  Pertinent labs & imaging results that were available during my care of the patient were reviewed by me and considered in my medical decision making (see chart for details).  57 year old male here with dog bite to left hand.  This was by his dog.  Injury to the webspace between left thumb and index finger.  This is a gaping, square-shaped wound.  Dog's vaccinations are up-to-date.  Patient's tetanus is also up-to-date.  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  Given this and gaping nature of the wound, will require loose closure.  We discussed risk versus benefit of closure including potential for infection, patient acknowledged understandsing and agreed to closure here.  Tolerated procedure well, no acute complications.  Given first dose of Augmentin here, will discharge home with  same.  Discussed home wound care.  Close follow-up with PCP in 1 week for suture removal.  He understands to monitor for any signs of infection including redness, drainage, swelling, fever, etc.  He will return here for any new or acute changes.  Final Clinical Impressions(s) / ED Diagnoses   Final diagnoses:  Dog bite, initial encounter    ED Discharge Orders        Ordered    amoxicillin-clavulanate (AUGMENTIN) 875-125 MG tablet  Every 12 hours     08/13/17 0505       Garlon Hatchet, PA-C 08/13/17 0511    Molpus, Jonny Ruiz, MD 08/13/17 [phone]

## 2017-08-13 NOTE — ED Triage Notes (Signed)
Pt states he was bit by his own dog  Dog is up to date on his shots  Pt has a laceration to the webbed area between his thumb and index finger on his left hand   Bleeding controlled

## 2017-08-27 ENCOUNTER — Encounter: Payer: Self-pay | Admitting: Cardiovascular Disease

## 2017-08-27 ENCOUNTER — Ambulatory Visit: Payer: BC Managed Care – PPO | Admitting: Cardiovascular Disease

## 2017-08-27 VITALS — BP 134/80 | HR 53 | Ht 70.0 in | Wt 183.4 lb

## 2017-08-27 DIAGNOSIS — F329 Major depressive disorder, single episode, unspecified: Secondary | ICD-10-CM

## 2017-08-27 DIAGNOSIS — I480 Paroxysmal atrial fibrillation: Secondary | ICD-10-CM | POA: Diagnosis not present

## 2017-08-27 DIAGNOSIS — F32A Depression, unspecified: Secondary | ICD-10-CM

## 2017-08-27 DIAGNOSIS — I10 Essential (primary) hypertension: Secondary | ICD-10-CM | POA: Diagnosis not present

## 2017-08-27 NOTE — Patient Instructions (Signed)
Medication Instructions:  Your physician recommends that you continue on your current medications as directed. Please refer to the Current Medication list given to you today.  Follow-Up: Your physician wants you to follow-up in: 12 months with Dr. Kelly.  You will receive a reminder letter in the mail two months in advance. If you don't receive a letter, please call our office to schedule the follow-up appointment.   If you need a refill on your cardiac medications before your next appointment, please call your pharmacy.   
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
## 2017-08-28 ENCOUNTER — Encounter: Payer: Self-pay | Admitting: Cardiovascular Disease

## 2017-08-31 ENCOUNTER — Other Ambulatory Visit: Payer: Self-pay | Admitting: Cardiovascular Disease

## 2017-08-31 NOTE — Telephone Encounter (Signed)
REFILL 

## 2017-11-05 ENCOUNTER — Other Ambulatory Visit: Payer: Self-pay | Admitting: Cardiovascular Disease

## 2018-03-01 ENCOUNTER — Other Ambulatory Visit: Payer: Self-pay | Admitting: Cardiovascular Disease

## 2018-08-24 ENCOUNTER — Telehealth: Payer: Self-pay | Admitting: *Deleted

## 2018-08-24 NOTE — Telephone Encounter (Signed)
08/24/18 @ 11:30 am,phone busy.

## 2018-08-31 ENCOUNTER — Other Ambulatory Visit: Payer: Self-pay | Admitting: Cardiovascular Disease

## 2018-08-31 NOTE — Telephone Encounter (Signed)
Diltiazem CD 249 mg refilled.

## 2018-10-21 ENCOUNTER — Encounter: Payer: Self-pay | Admitting: Internal Medicine

## 2018-11-11 ENCOUNTER — Telehealth: Payer: Self-pay | Admitting: Physician Assistant

## 2018-11-11 ENCOUNTER — Other Ambulatory Visit: Payer: Self-pay

## 2018-11-11 ENCOUNTER — Ambulatory Visit: Payer: BC Managed Care – PPO | Admitting: Physician Assistant

## 2018-11-11 ENCOUNTER — Encounter: Payer: Self-pay | Admitting: Physician Assistant

## 2018-11-11 VITALS — BP 130/70 | HR 68 | Temp 97.7°F | Ht 70.0 in | Wt 192.2 lb

## 2018-11-11 DIAGNOSIS — I480 Paroxysmal atrial fibrillation: Secondary | ICD-10-CM

## 2018-11-11 DIAGNOSIS — I10 Essential (primary) hypertension: Secondary | ICD-10-CM

## 2018-11-11 DIAGNOSIS — R002 Palpitations: Secondary | ICD-10-CM

## 2018-11-11 MED ORDER — OLMESARTAN MEDOXOMIL 40 MG PO TABS: ORAL_TABLET | ORAL | 3 refills | Status: DC

## 2018-11-11 MED ORDER — DILTIAZEM HCL ER COATED BEADS 240 MG PO CP24: ORAL_CAPSULE | ORAL | 3 refills | Status: DC

## 2018-11-11 NOTE — Telephone Encounter (Signed)
I called pt to confirm appt for 7-16-0 with Dr Almyra Deforest.       COVID-19 Pre-Screening Questions:   In the past 7 to 10 days have you had a cough,  shortness of breath, headache, congestion, fever (100 or greater) body aches, chills, sore throat, or sudden loss of taste or sense of smell? no  Have you been around anyone with known Covid 19.  Have you been around anyone who is awaiting Covid 19 test results in the past 7 to 10 days? no  Have you been around anyone who has been exposed to Covid 19, or has mentioned symptoms of Covid 19 within the past 7 to 10 days? no  If you have any concerns/questions about symptoms patients report during screening (either on the phone or at threshold). Contact the provider seeing the patient or DOD for further guidance.  If neither are available contact a member of the leadership team.

## 2018-11-11 NOTE — Patient Instructions (Signed)
Medication Instructions:  Your physician recommends that you continue on your current medications as directed. Please refer to the Current Medication list given to you today.  If you need a refill on your cardiac medications before your next appointment, please call your pharmacy.   Lab work: NONE ordered at this time of appointment   If you have labs (blood work) drawn today and your tests are completely normal, you will receive your results only by: Marland Kitchen MyChart Message (if you have MyChart) OR . A paper copy in the mail If you have any lab test that is abnormal or we need to change your treatment, we will call you to review the results.  Testing/Procedures: NONE ordered at this time of appointment   Follow-Up: At [facility], you and your health needs are our priority.  As part of our continuing mission to provide you with exceptional heart care, we have created designated Provider Care Teams.  These Care Teams include your primary Cardiologist (physician) and Advanced Practice Providers (APPs -  Physician Assistants and Nurse Practitioners) who all work together to provide you with the care you need, when you need it. You will need a follow up appointment in 12 months.  Please call our office 2 months in advance to schedule this appointment.  You may see [name], MD or one of the following Advanced Practice Providers on your designated Care Team: [place], [place] . Fabian Sharp, PA-C  Any Other Special Instructions Will Be Listed Below (If Applicable).

## 2018-11-11 NOTE — Progress Notes (Signed)
Cardiology Office Note    Date:  11/13/2018   ID:  Jared [name], DOB [birth year], MRN [id number]  PCP:  [name], MD  Cardiologist:  Dr. Claiborne Billings  Chief Complaint  Patient presents with  . Follow-up    History of Present Illness:  Jared [name] is a 58 y.o. male with PMH of EtOH abuse, depression and hypertension.  Patient was started on Abilify and trazodone in 2017 for depression and possible suicidal ideation.  He developed atrial fibrillation which responded to Cardizem drip.  He was started initially on Xarelto, this was later changed to aspirin due to low CHA2DS2-Vasc score of 1.  Echocardiogram showed EF of 55 to 60%, grade 2 DD, mild MR, evidence of increased septal wall thickness consistent with lipomatous hypertrophy.  Patient presents today for cardiology office visit.  Her blood pressure has been quite well controlled.  Lipid panel obtained this year was also well controlled as well.  I do not have the record from this year's lipid panel however he says when compared to the last lipid panel in December the LDL actually improved.  Previous lipid panel obtained on 04/02/2018 showed HDL 46, LDL 102, total cholesterol 176, triglyceride 139.  Last hemoglobin A1c in December 2019 was 5.4.  Renal function and electrolyte were normal.  Otherwise he denies any lower extremity edema, orthopnea or PND.  He does not have any recent exertional symptoms.  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  His wife who teaches both Spanish and works as a Copywriter, advertising prior to the pandemic.  She is only teaching Spanish at this point and not working in the dental office anytime soon.   Past Medical History:  Diagnosis Date  . Anxiety   . Hypertension   . PVC (premature ventricular contraction)     No past surgical history on file.  Current Medications: Outpatient Medications Prior to Visit  Medication Sig Dispense Refill  . Ascorbic Acid (VITAMIN C) 1000 MG tablet Take 1,000 mg by mouth  daily.    Marland Kitchen aspirin 81 MG tablet Take 81 mg by mouth daily.     Marland Kitchen b complex vitamins tablet Take 1 tablet by mouth daily.    . Biotin 1000 MCG tablet Take 1,000 mcg by mouth 3 (three) times daily.    . diphenhydrAMINE (BENADRYL) 25 MG tablet Take 25 mg by mouth every 6 (six) hours as needed for itching, allergies or sleep.     Marland Kitchen gabapentin (NEURONTIN) 300 MG capsule Take 300 mg by mouth 4 (four) times daily.    . naphazoline-pheniramine (NAPHCON-A) 0.025-0.3 % ophthalmic solution Place 1 drop into both eyes 4 (four) times daily as needed for eye irritation or allergies.    . TURMERIC PO Take by mouth daily.    Marland Kitchen diltiazem (CARDIZEM CD) 240 MG 24 hr capsule TAKE ONE CAPSULE EACH DAY 30 capsule 1  . olmesartan (BENICAR) 40 MG tablet TAKE ONE TABLET EACH DAY 90 tablet 2  . naproxen sodium (ALEVE) 220 MG tablet Take 220 mg by mouth 2 (two) times daily as needed (pain).    Marland Kitchen sertraline (ZOLOFT) 50 MG tablet 1 po qd for 2 weeks and then increase to 2 po qd (Patient taking differently: Take 50 mg by mouth daily. ) 60 tablet 2   No facility-administered medications prior to visit.      Allergies:   Patient has no known allergies.   Social History   Socioeconomic History  . Marital status:  Married    Spouse name: Not on file  . Number of children: Not on file  . Years of education: Not on file  . Highest education level: Not on file  Occupational History  . Not on file  Social Needs  . Financial resource strain: Not on file  . Food insecurity    Worry: Not on file    Inability: Not on file  . Transportation needs    Medical: Not on file    Non-medical: Not on file  Tobacco Use  . Smoking status: Former Games developermoker  . Smokeless tobacco: Former NeurosurgeonUser    Quit date: 01/28/1996  Substance and Sexual Activity  . Alcohol use: No    Alcohol/week: 18.0 standard drinks    Types: 4 Glasses of wine, 14 Standard drinks or equivalent per week    Comment: Quit in 07/2015.  . Drug use: No  . Sexual  activity: Yes    Birth control/protection: None  Lifestyle  . Physical activity    Days per week: Not on file    Minutes per session: Not on file  . Stress: Not on file  Relationships  . Social Musicianconnections    Talks on phone: Not on file    Gets together: Not on file    Attends religious service: Not on file    Active member of club or organization: Not on file    Attends meetings of clubs or organizations: Not on file    Relationship status: Not on file  Other Topics Concern  . Not on file  Social History Narrative  . Not on file     Family History:  The patient's family history includes Alcohol abuse in his paternal uncle; Heart attack in his father; Heart disease in his father and mother; Hypertension in his mother; Stroke in his mother.   ROS:   Please see the history of present illness.    ROS All other systems reviewed and are negative.   PHYSICAL EXAM:   VS:  BP 130/70   Pulse 68   Temp 97.7 F (36.5 C)   Ht 5\' 10"  (1.778 m)   Wt 192 lb 3.2 oz (87.2 kg)   SpO2 98%   BMI 27.58 kg/m    GEN: Well nourished, well developed, in no acute distress  HEENT: normal  Neck: no JVD, carotid bruits, or masses Cardiac: RRR; no murmurs, rubs, or gallops,no edema  Respiratory:  clear to auscultation bilaterally, normal work of breathing GI: soft, nontender, nondistended, + BS MS: no deformity or atrophy  Skin: warm and dry, no rash Neuro:  Alert and Oriented x 3, Strength and sensation are intact Psych: euthymic mood, full affect  Wt Readings from Last 3 Encounters:  11/11/18 192 lb 3.2 oz (87.2 kg)  08/27/17 183 lb 6.4 oz (83.2 kg)  05/29/16 196 lb (88.9 kg)      Studies/Labs Reviewed:   EKG:  EKG is ordered today.  The ekg ordered today demonstrates normal sinus rhythm without significant ST-T wave changes  Recent Labs: No results found for requested labs within last 8760 hours.   Lipid Panel    Component Value Date/Time   CHOL 154 01/28/2016 0617   TRIG 198  (H) 01/28/2016 0617   HDL 38 (L) 01/28/2016 0617   CHOLHDL 4.1 01/28/2016 0617   VLDL 40 01/28/2016 0617   LDLCALC 76 01/28/2016 0617   LDLDIRECT 98.6 04/23/2010 0916    Additional studies/ records that were reviewed today include:  Echo 01/29/2016 LV EF: 55% -   60%  ------------------------------------------------------------------- Indications:      [address].  ------------------------------------------------------------------- History:   Risk factors:  Hypertension. Dyslipidemia.  ------------------------------------------------------------------- Study Conclusions  - Left ventricle: The cavity size was mildly dilated. Systolic   function was normal. The estimated ejection fraction was in the   range of 55% to 60%. Wall motion was normal; there were no   regional wall motion abnormalities. Features are consistent with   a pseudonormal left ventricular filling pattern, with concomitant   abnormal relaxation and increased filling pressure (grade 2   diastolic dysfunction). - Aortic valve: There was mild regurgitation. - Atrial septum: There was increased thickness of the septum,   consistent with lipomatous hypertrophy.    ASSESSMENT:    1. Palpitations   2. Essential hypertension   3. PAF (paroxysmal atrial fibrillation) ([facility])      PLAN:  In order of problems listed above:  1. PAF: No recurrence of atrial fibrillation.  Continue aspirin and diltiazem  2. Hypertension: Controlled on Benicar and the diltiazem.  Annual lab work followed by primary care provider.   Medication Adjustments/Labs and Tests Ordered: Current medicines are reviewed at length with the patient today.  Concerns regarding medicines are outlined above.  Medication changes, Labs and Tests ordered today are listed in the Patient Instructions below. Patient Instructions  Medication Instructions:  Your physician recommends that you continue on your current medications as directed. Please refer  to the Current Medication list given to you today.  If you need a refill on your cardiac medications before your next appointment, please call your pharmacy.   Lab work: NONE ordered at this time of appointment   If you have labs (blood work) drawn today and your tests are completely normal, you will receive your results only by: Marland Kitchen. MyChart Message (if you have MyChart) OR . A paper copy in the mail If you have any lab test that is abnormal or we need to change your treatment, we will call you to review the results.  Testing/Procedures: NONE ordered at this time of appointment   Follow-Up: At [facility], you and your health needs are our priority.  As part of our continuing mission to provide you with exceptional heart care, we have created designated Provider Care Teams.  These Care Teams include your primary Cardiologist (physician) and Advanced Practice Providers (APPs -  Physician Assistants and Nurse Practitioners) who all work together to provide you with the care you need, when you need it. You will need a follow up appointment in 12 months.  Please call our office 2 months in advance to schedule this appointment.  You may see Nicki Guadalajarahomas Kelly, MD or one of the following Advanced Practice Providers on your designated Care Team: RippeyHao Tigerlily Christine, [place] JerseyPA-C . Micah FlesherAngela Duke, PA-C  Any Other Special Instructions Will Be Listed Below (If Applicable).       Ramond DialSigned, Arthea Nobel, GeorgiaPA  11/13/2018 11:53 PM    [facility] [address], [place], [place]  [address] Phone: [phone]; Fax: [phone]

## 2019-11-16 ENCOUNTER — Other Ambulatory Visit: Payer: Self-pay | Admitting: Physician Assistant

## 2019-11-16 NOTE — Telephone Encounter (Signed)
Rx has been sent to the pharmacy electronically. ° °

## 2019-12-14 ENCOUNTER — Ambulatory Visit: Payer: BC Managed Care – PPO | Admitting: Physician Assistant

## 2019-12-15 ENCOUNTER — Other Ambulatory Visit: Payer: Self-pay

## 2019-12-15 ENCOUNTER — Encounter: Payer: Self-pay | Admitting: Physician Assistant

## 2019-12-15 ENCOUNTER — Ambulatory Visit (INDEPENDENT_AMBULATORY_CARE_PROVIDER_SITE_OTHER): Payer: BC Managed Care – PPO | Admitting: Physician Assistant

## 2019-12-15 VITALS — BP 120/60 | HR 63 | Ht 70.0 in | Wt 192.0 lb

## 2019-12-15 DIAGNOSIS — I10 Essential (primary) hypertension: Secondary | ICD-10-CM | POA: Diagnosis not present

## 2019-12-15 DIAGNOSIS — I480 Paroxysmal atrial fibrillation: Secondary | ICD-10-CM

## 2019-12-15 DIAGNOSIS — R002 Palpitations: Secondary | ICD-10-CM

## 2019-12-15 NOTE — Progress Notes (Signed)
Cardiology Office Note:    Date:  12/17/2019   ID:  Tysen Roesler, DOB 1960/10/14, MRN [id number]  PCP:  Benita Stabile, MD  [facility] Cardiologist:  Nicki Guadalajara, MD  [facility] Electrophysiologist:  None   Referring MD: Benita Stabile, MD   Chief Complaint  Patient presents with  . Follow-up    seen for Dr. Tresa Endo.    History of Present Illness:    Jared [name] is a 59 y.o. male with a hx of EtOH abuse, depression and hypertension.  Patient was started on Abilify and trazodone in 2017 for depression and possible suicidal ideation.  He developed atrial fibrillation which responded to Cardizem drip.  He was started initially on Xarelto, this was later changed to aspirin due to low CHA2DS2-Vasc score of 1.  Echocardiogram showed EF of 55 to 60%, grade 2 DD, mild MR, evidence of increased septal wall thickness consistent with lipomatous hypertrophy.  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  His wife teaches both Spanish and work as a Armed forces operational officer prior to the pandemic.  Patient presents today for annual follow-up.  He denies any recent chest pain or shortness of breath.  He has no recent palpitation.  Interestingly, he recently found out he has a half brother who lives in [place] to [employer] and check book stub his father saved. His brother is 51 year old.  He is quite excited to bring his wife to meet his half brother.  They plan to travel to [place] in 2 weeks.  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  One of their child had a COVID-19 in the end of last year, however she is recovering okay other than slight proteinuria.  Overall, he has been doing well and can follow-up next year.   Past Medical History:  Diagnosis Date  . Anxiety   . Hypertension   . PVC (premature ventricular contraction)     History reviewed. No  pertinent surgical history.  Current Medications: Current Meds  Medication Sig  . Ascorbic Acid (VITAMIN C) 1000 MG tablet Take 1,000 mg by mouth daily.  Marland Kitchen aspirin 81 MG tablet Take 81 mg by mouth 2 (two) times daily.   Marland Kitchen b complex vitamins tablet Take 1 tablet by mouth daily.  . Biotin 1000 MCG tablet Take 1,000 mcg by mouth 3 (three) times daily.  Marland Kitchen diltiazem (CARDIZEM CD) 240 MG 24 hr capsule TAKE ONE CAPSULE EACH DAY  . diphenhydrAMINE (BENADRYL) 25 MG tablet Take 25 mg by mouth every 6 (six) hours as needed for itching, allergies or sleep.   Marland Kitchen gabapentin (NEURONTIN) 300 MG capsule Take 300 mg by mouth 4 (four) times daily.  . naphazoline-pheniramine (NAPHCON-A) 0.025-0.3 % ophthalmic solution Place 1 drop into both eyes 4 (four) times daily as needed for eye irritation or allergies.  Marland Kitchen olmesartan (BENICAR) 40 MG tablet TAKE ONE TABLET DAILY  . TURMERIC PO Take by mouth daily.     Allergies:   Patient has no known allergies.   Social History   Socioeconomic History  . Marital status: Married    Spouse name: Not on file  . Number of children: Not on file  . Years of education: Not on file  . Highest education level: Not on file  Occupational History  .  Not on file  Tobacco Use  . Smoking status: Former Games developer  . Smokeless tobacco: Former Neurosurgeon    Quit date: 01/28/1996  Substance and Sexual Activity  . Alcohol use: No    Alcohol/week: 18.0 standard drinks    Types: 4 Glasses of wine, 14 Standard drinks or equivalent per week    Comment: Quit in 07/2015.  . Drug use: No  . Sexual activity: Yes    Birth control/protection: None  Other Topics Concern  . Not on file  Social History Narrative  . Not on file   Social Determinants of Health   Financial Resource Strain:   . Difficulty of Paying Living Expenses: Not on file  Food Insecurity:   . Worried About Programme researcher, broadcasting/film/video in the Last Year: Not on file  . Ran Out of Food in the Last Year: Not on file  Transportation  Needs:   . Lack of Transportation (Medical): Not on file  . Lack of Transportation (Non-Medical): Not on file  Physical Activity:   . Days of Exercise per Week: Not on file  . Minutes of Exercise per Session: Not on file  Stress:   . Feeling of Stress : Not on file  Social Connections:   . Frequency of Communication with Friends and Family: Not on file  . Frequency of Social Gatherings with Friends and Family: Not on file  . Attends Religious Services: Not on file  . Active Member of Clubs or Organizations: Not on file  . Attends Banker Meetings: Not on file  . Marital Status: Not on file     Family History: The patient's family history includes Alcohol abuse in his paternal uncle; Heart attack in his father; Heart disease in his father and mother; Hypertension in his mother; Stroke in his mother.  ROS:   Please see the history of present illness.     All other systems reviewed and are negative.  EKGs/Labs/Other Studies Reviewed:    The following studies were reviewed today:  Echo 01/29/2016 LV EF: 55% -  60%   -------------------------------------------------------------------  Indications:   35218.   -------------------------------------------------------------------  History:  Risk factors: Hypertension. Dyslipidemia.   -------------------------------------------------------------------  Study Conclusions   - Left ventricle: The cavity size was mildly dilated. Systolic  function was normal. The estimated ejection fraction was in the  range of 55% to 60%. Wall motion was normal; there were no  regional wall motion abnormalities. Features are consistent with  a pseudonormal left ventricular filling pattern, with concomitant  abnormal relaxation and increased filling pressure (grade 2  diastolic dysfunction).  - Aortic valve: There was mild regurgitation.  - Atrial septum: There was increased thickness of the septum,  consistent with  lipomatous hypertrophy.   EKG:  EKG is ordered today.  The ekg ordered today demonstrates NSR without significant ST-T wave changes  Recent Labs: No results found for requested labs within last 8760 hours.  Recent Lipid Panel    Component Value Date/Time   CHOL 154 01/28/2016 0617   TRIG 198 (H) 01/28/2016 0617   HDL 38 (L) 01/28/2016 0617   CHOLHDL 4.1 01/28/2016 0617   VLDL 40 01/28/2016 0617   LDLCALC 76 01/28/2016 0617   LDLDIRECT 98.6 04/23/2010 0916    Physical Exam:    VS:  BP 120/60   Pulse 63   Ht 5\' 10"  (1.778 m)   Wt 192 lb (87.1 kg)   BMI 27.55 kg/m     Wt Readings from  Last 3 Encounters:  12/15/19 192 lb (87.1 kg)  11/11/18 192 lb 3.2 oz (87.2 kg)  08/27/17 183 lb 6.4 oz (83.2 kg)     GEN:  Well nourished, well developed in no acute distress HEENT: Normal NECK: No JVD; No carotid bruits LYMPHATICS: No lymphadenopathy CARDIAC: RRR, no murmurs, rubs, gallops RESPIRATORY:  Clear to auscultation without rales, wheezing or rhonchi  ABDOMEN: Soft, non-tender, non-distended MUSCULOSKELETAL:  No edema; No deformity  SKIN: Warm and dry NEUROLOGIC:  Alert and oriented x 3 PSYCHIATRIC:  Normal affect   ASSESSMENT:    1. PAF (paroxysmal atrial fibrillation) (HCC)   2. Essential hypertension    PLAN:    In order of problems listed above:  1. PAF: not on anticoagulation therapy due to low CHA2DS2-Vasc score of 1. No recent recurrence  2. Hypertension: Blood pressure stable on diltiazem and Benicar   Medication Adjustments/Labs and Tests Ordered: Current medicines are reviewed at length with the patient today.  Concerns regarding medicines are outlined above.  Orders Placed This Encounter  Procedures  . EKG 12-Lead   No orders of the defined types were placed in this encounter.   Patient Instructions  Medication Instructions:  NO CHANGE *If you need a refill on your cardiac medications before your next appointment, please call your  pharmacy*   Lab Work: If you have labs (blood work) drawn today and your tests are completely normal, you will receive your results only by: Marland Kitchen MyChart Message (if you have MyChart) OR . A paper copy in the mail If you have any lab test that is abnormal or we need to change your treatment, we will call you to review the results.   Follow-Up: At [facility], you and your health needs are our priority.  As part of our continuing mission to provide you with exceptional heart care, we have created designated Provider Care Teams.  These Care Teams include your primary Cardiologist (physician) and Advanced Practice Providers (APPs -  Physician Assistants and Nurse Practitioners) who all work together to provide you with the care you need, when you need it.  We recommend signing up for the patient portal called "MyChart".  Sign up information is provided on this After Visit Summary.  MyChart is used to connect with patients for Virtual Visits (Telemedicine).  Patients are able to view lab/test results, encounter notes, upcoming appointments, etc.  Non-urgent messages can be sent to your provider as well.   To learn more about what you can do with MyChart, go to [email or web address].    Your next appointment:   12 month(s)  The format for your next appointment:   In Person  Provider:   You may see Nicki Guadalajara, MD or one of the following Advanced Practice Providers on your designated Care Team:    Azalee Course, PA-C  Micah Flesher, PA-C or   Judy Pimple, PA-C        Signed, [place], [place]  12/17/2019 11:40 PM    [facility]

## 2019-12-15 NOTE — Patient Instructions (Signed)
Medication Instructions:  NO CHANGE *If you need a refill on your cardiac medications before your next appointment, please call your pharmacy*   Lab Work: If you have labs (blood work) drawn today and your tests are completely normal, you will receive your results only by: Marland Kitchen MyChart Message (if you have MyChart) OR . A paper copy in the mail If you have any lab test that is abnormal or we need to change your treatment, we will call you to review the results.   Follow-Up: At [facility], you and your health needs are our priority.  As part of our continuing mission to provide you with exceptional heart care, we have created designated Provider Care Teams.  These Care Teams include your primary Cardiologist (physician) and Advanced Practice Providers (APPs -  Physician Assistants and Nurse Practitioners) who all work together to provide you with the care you need, when you need it.  We recommend signing up for the patient portal called "MyChart".  Sign up information is provided on this After Visit Summary.  MyChart is used to connect with patients for Virtual Visits (Telemedicine).  Patients are able to view lab/test results, encounter notes, upcoming appointments, etc.  Non-urgent messages can be sent to your provider as well.   To learn more about what you can do with MyChart, go to [email or web address].    Your next appointment:   12 month(s)  The format for your next appointment:   In Person  Provider:   You may see Nicki Guadalajara, MD or one of the following Advanced Practice Providers on your designated Care Team:    Azalee Course, PA-C  Micah Flesher, PA-C or   Judy Pimple, [place]

## 2019-12-17 ENCOUNTER — Encounter: Payer: Self-pay | Admitting: Physician Assistant

## 2020-01-30 ENCOUNTER — Other Ambulatory Visit: Payer: Self-pay | Admitting: Cardiovascular Disease

## 2020-12-17 ENCOUNTER — Other Ambulatory Visit: Payer: Self-pay

## 2020-12-17 ENCOUNTER — Ambulatory Visit: Payer: BC Managed Care – PPO | Admitting: Cardiovascular Disease

## 2020-12-17 ENCOUNTER — Encounter: Payer: Self-pay | Admitting: Cardiovascular Disease

## 2020-12-17 VITALS — BP 148/82 | HR 63 | Ht 70.0 in | Wt 197.6 lb

## 2020-12-17 DIAGNOSIS — I10 Essential (primary) hypertension: Secondary | ICD-10-CM | POA: Diagnosis not present

## 2020-12-17 DIAGNOSIS — U071 COVID-19: Secondary | ICD-10-CM

## 2020-12-17 DIAGNOSIS — I480 Paroxysmal atrial fibrillation: Secondary | ICD-10-CM | POA: Diagnosis not present

## 2020-12-17 DIAGNOSIS — M25473 Effusion, unspecified ankle: Secondary | ICD-10-CM | POA: Diagnosis not present

## 2020-12-17 MED ORDER — HYDROCHLOROTHIAZIDE 25 MG PO TABS: 12.5000 mg | ORAL_TABLET | Freq: Every day | ORAL | 3 refills | Status: DC

## 2020-12-17 NOTE — Patient Instructions (Signed)
Medication Instructions:  Start HCTZ 12.5 mg every other day. If you notice BP is still above 140's, you may take 12.5 mg daily.  *If you need a refill on your cardiac medications before your next appointment, please call your pharmacy*   Follow-Up: At [facility], you and your health needs are our priority.  As part of our continuing mission to provide you with exceptional heart care, we have created designated Provider Care Teams.  These Care Teams include your primary Cardiologist (physician) and Advanced Practice Providers (APPs -  Physician Assistants and Nurse Practitioners) who all work together to provide you with the care you need, when you need it.  We recommend signing up for the patient portal called "MyChart".  Sign up information is provided on this After Visit Summary.  MyChart is used to connect with patients for Virtual Visits (Telemedicine).  Patients are able to view lab/test results, encounter notes, upcoming appointments, etc.  Non-urgent messages can be sent to your provider as well.   To learn more about what you can do with MyChart, go to [email or web address].    Your next appointment:   12 month(s)  The format for your next appointment:   In Person  Provider:   Nicki Guadalajara, MD
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
## 2020-12-24 ENCOUNTER — Encounter: Payer: Self-pay | Admitting: Cardiovascular Disease

## 2021-03-12 ENCOUNTER — Other Ambulatory Visit: Payer: Self-pay

## 2021-03-12 ENCOUNTER — Ambulatory Visit (INDEPENDENT_AMBULATORY_CARE_PROVIDER_SITE_OTHER): Payer: BC Managed Care – PPO

## 2021-03-12 ENCOUNTER — Ambulatory Visit
Admission: RE | Admit: 2021-03-12 | Discharge: 2021-03-12 | Disposition: A | Discharge status: Home Health | Payer: BC Managed Care – PPO | Source: Ambulatory Visit | Attending: Family Medicine | Admitting: Family Medicine

## 2021-03-12 VITALS — BP 126/78 | HR 69 | Temp 98.0°F | Resp 20

## 2021-03-12 DIAGNOSIS — R059 Cough, unspecified: Secondary | ICD-10-CM | POA: Diagnosis not present

## 2021-03-12 DIAGNOSIS — R0602 Shortness of breath: Secondary | ICD-10-CM | POA: Diagnosis not present

## 2021-03-12 DIAGNOSIS — J209 Acute bronchitis, unspecified: Secondary | ICD-10-CM | POA: Diagnosis not present

## 2021-03-12 MED ORDER — ALBUTEROL SULFATE HFA 108 (90 BASE) MCG/ACT IN AERS: 1.0000 | INHALATION_SPRAY | Freq: Four times a day (QID) | RESPIRATORY_TRACT | 0 refills | Status: DC | PRN

## 2021-03-12 MED ORDER — PROMETHAZINE-DM 6.25-15 MG/5ML PO SYRP: 5.0000 mL | ORAL_SOLUTION | Freq: Four times a day (QID) | ORAL | 0 refills | Status: DC | PRN

## 2021-03-12 MED ORDER — DEXAMETHASONE SODIUM PHOSPHATE 10 MG/ML IJ SOLN
10.0000 mg | Freq: Once | INTRAMUSCULAR | Status: AC
  Administered 2021-03-12: 10 mg via INTRAMUSCULAR

## 2021-03-12 NOTE — ED Provider Notes (Signed)
[facility]    CSN: [id number] Arrival date & time: 03/12/21  0935      History   Chief Complaint Chief Complaint  Patient presents with   Cough    HPI Jared [name] is a 60 y.o. male.   Patient presenting today with 3-week history of persistent hacking cough, sometimes productive of sputum.  Initially he also had nasal congestion, scratchy throat but those symptoms have long since resolved.  Denies chest pain, shortness of breath, fever, chills, body aches.  States he has a history of getting bronchitis, particularly this time of year.  Taking Mucinex with minimal relief.  No new sick contacts recently.   Past Medical History:  Diagnosis Date   Anxiety    Hypertension    PVC (premature ventricular contraction)     Patient Active Problem List   Diagnosis Date Noted   Major depressive disorder, recurrent episode, moderate ([place]) 02/12/2016   PAF (paroxysmal atrial fibrillation) ([facility])    Chronic diastolic heart failure ([facility])    Atrial fibrillation with RVR ([place]) 01/28/2016   Major depressive disorder 01/25/2016   Intermittent explosive disorder 01/24/2016   Major depressive disorder, single episode, severe without psychotic features ([place])    Alcohol abuse 05/20/2014   Depression 05/20/2014   Suicide attempt ([place]) 05/20/2014   Suicidal ideations    INGUINAL HERNIA 10/12/2009   HEMORRHOIDS, WITH BLEEDING 09/28/2008   HLD (hyperlipidemia) 08/23/2008   ERECTILE DYSFUNCTION 07/26/2008   Essential hypertension 07/26/2008   ALLERGIC RHINITIS 07/26/2008   GERD 07/26/2008   PEPTIC ULCER DISEASE 07/26/2008   NONSPECIFIC ABNORMAL ELECTROCARDIOGRAM 07/26/2008    History reviewed. No pertinent surgical history.     Home Medications    Prior to Admission medications   Medication Sig Start Date End Date Taking? Authorizing Provider  albuterol (VENTOLIN HFA) 108 (90 Base) MCG/ACT inhaler Inhale 1-2 puffs into the lungs every 6 (six) hours as needed for  wheezing or shortness of breath. 03/12/21  Yes Volney American, PA-C  promethazine-dextromethorphan (PROMETHAZINE-DM) 6.25-15 MG/5ML syrup Take 5 mLs by mouth 4 (four) times daily as needed for cough. 03/12/21  Yes Volney American, PA-C  Ascorbic Acid (VITAMIN C) 1000 MG tablet Take 1,000 mg by mouth daily.    [provider]  aspirin 81 MG tablet Take 81 mg by mouth daily.    [provider]  b complex vitamins tablet Take 1 tablet by mouth daily.    [provider]  Biotin 1000 MCG tablet Take 1,000 mcg by mouth 3 (three) times daily. Patient not taking: Reported on 12/17/2020    [provider]  diltiazem (CARDIZEM CD) 240 MG 24 hr capsule TAKE ONE CAPSULE EACH DAY 01/30/20   Troy Sine, MD  diphenhydrAMINE (BENADRYL) 25 MG tablet Take 25 mg by mouth every 6 (six) hours as needed for itching, allergies or sleep.     [provider]  gabapentin (NEURONTIN) 300 MG capsule Take 300 mg by mouth 4 (four) times daily.    [provider]  hydrochlorothiazide (HYDRODIURIL) 25 MG tablet Take 0.5 tablets (12.5 mg total) by mouth daily. 12/17/20 03/17/21  Troy Sine, MD  Magnesium 250 MG TABS Take 250 mg by mouth daily.    [provider]  Multiple Vitamins-Minerals (MENS 50+ MULTI VITAMIN/MIN) TABS Take by mouth daily.    [provider]  olmesartan (BENICAR) 40 MG tablet TAKE ONE TABLET DAILY 01/30/20   Troy Sine, MD  Potassium 99 MG TABS Take  88 mg by mouth daily.    [provider]  VITAMIN D PO Take 5,000 Units by mouth daily.    [provider]  Zinc 50 MG TABS Take 50 mg by mouth daily.    [provider]    Family History Family History  Problem Relation Age of Onset   Heart disease Mother    Hypertension Mother    Stroke Mother    Heart disease Father    Heart attack Father    Alcohol abuse Paternal Uncle     Social History Social History   Tobacco Use    Smoking status: Former   Smokeless tobacco: Former    Quit date: 01/28/1996  Substance Use Topics   Alcohol use: No    Alcohol/week: 18.0 standard drinks    Types: 4 Glasses of wine, 14 Standard drinks or equivalent per week    Comment: Quit in 07/2015.   Drug use: No     Allergies   Patient has no known allergies.   Review of Systems Review of Systems Per HPI  Physical Exam Triage Vital Signs ED Triage Vitals  Enc Vitals Group     BP 03/12/21 1014 126/78     Pulse Rate 03/12/21 1014 69     Resp 03/12/21 1014 20     Temp 03/12/21 1014 98 F (36.7 C)     Temp src --      SpO2 03/12/21 1014 98 %     Weight --      Height --      Head Circumference --      Peak Flow --      Pain Score 03/12/21 1012 0     Pain Loc --      Pain Edu? --      Excl. in GC? --    No data found.  Updated Vital Signs BP 126/78   Pulse 69   Temp 98 F (36.7 C)   Resp 20   SpO2 98%   Visual Acuity Right Eye Distance:   Left Eye Distance:   Bilateral Distance:    Right Eye Near:   Left Eye Near:    Bilateral Near:     Physical Exam Vitals and nursing note reviewed.  Constitutional:      Appearance: Normal appearance.  HENT:     Head: Atraumatic.     Nose: Nose normal.     Mouth/Throat:     Mouth: Mucous membranes are moist.     Pharynx: Oropharynx is clear. No posterior oropharyngeal erythema.  Eyes:     Extraocular Movements: Extraocular movements intact.     Conjunctiva/sclera: Conjunctivae normal.  Cardiovascular:     Rate and Rhythm: Normal rate and regular rhythm.     Heart sounds: Normal heart sounds.  Pulmonary:     Effort: Pulmonary effort is normal.     Breath sounds: Normal breath sounds. No wheezing or rales.  Musculoskeletal:        General: Normal range of motion.     Cervical back: Normal range of motion and neck supple.  Skin:    General: Skin is warm and dry.  Neurological:     General: No focal deficit present.     Mental Status: He is oriented to  person, place, and time.     Motor: No weakness.     Gait: Gait normal.  Psychiatric:        Mood and Affect: Mood normal.  Thought Content: Thought content normal.        Judgment: Judgment normal.   UC Treatments / Results  Labs (all labs ordered are listed, but only abnormal results are displayed) Labs Reviewed - No data to display  EKG  Radiology DG Chest 2 View  Result Date: 03/12/2021 CLINICAL DATA:  Cough and shortness of breath for 3 weeks. EXAM: CHEST - 2 VIEW COMPARISON:  01/28/2016 FINDINGS: The cardiac silhouette, mediastinal and hilar contours are within normal limits. The lungs are clear. No pleural effusions, pulmonary infiltrates or pulmonary lesions. The bony thorax is intact. IMPRESSION: No acute cardiopulmonary findings. Electronically Signed   By: Marijo Sanes M.D.   On: 03/12/2021 10:25    Procedures Procedures (including critical care time)  Medications Ordered in UC Medications  dexamethasone (DECADRON) injection 10 mg (has no administration in time range)    Initial Impression / Assessment and Plan / UC Course  I have reviewed the triage vital signs and the nursing notes.  Pertinent labs & imaging results that were available during my care of the patient were reviewed by me and considered in my medical decision making (see chart for details).     Vital signs and exam very reassuring today, chest x-ray negative for acute pulmonary findings.  We will treat for bronchitis with IM Decadron, Phenergan DM, albuterol inhaler as needed.  Discussed supportive home care and return precautions.  Final Clinical Impressions(s) / UC Diagnoses   Final diagnoses:  Acute bronchitis, unspecified organism   Discharge Instructions   None    ED Prescriptions     Medication Sig Dispense Auth. Provider   promethazine-dextromethorphan (PROMETHAZINE-DM) 6.25-15 MG/5ML syrup Take 5 mLs by mouth 4 (four) times daily as needed for cough. 100 mL Volney American, PA-C   albuterol (VENTOLIN HFA) 108 (90 Base) MCG/ACT inhaler Inhale 1-2 puffs into the lungs every 6 (six) hours as needed for wheezing or shortness of breath. 18 g Volney American, [place]      PDMP not reviewed this encounter.   Volney American, [place] 03/12/21 1035

## 2021-03-12 NOTE — ED Triage Notes (Signed)
Pt presents with c/o cough for past 3 weeks  ?

## 2021-04-24 ENCOUNTER — Other Ambulatory Visit (HOSPITAL_COMMUNITY): Payer: Self-pay | Admitting: Internal Medicine

## 2021-04-24 ENCOUNTER — Other Ambulatory Visit: Payer: Self-pay | Admitting: Internal Medicine

## 2021-04-24 DIAGNOSIS — K4090 Unilateral inguinal hernia, without obstruction or gangrene, not specified as recurrent: Secondary | ICD-10-CM

## 2021-05-13 ENCOUNTER — Ambulatory Visit (HOSPITAL_COMMUNITY)
Admission: RE | Admit: 2021-05-13 | Discharge: 2021-05-13 | Disposition: A | Discharge status: Home / Self Care | Payer: BC Managed Care – PPO | Source: Ambulatory Visit | Attending: Internal Medicine | Admitting: Internal Medicine

## 2021-05-13 ENCOUNTER — Other Ambulatory Visit: Payer: Self-pay

## 2021-05-13 DIAGNOSIS — K4090 Unilateral inguinal hernia, without obstruction or gangrene, not specified as recurrent: Secondary | ICD-10-CM | POA: Insufficient documentation

## 2021-05-17 ENCOUNTER — Other Ambulatory Visit (HOSPITAL_COMMUNITY): Payer: Self-pay | Admitting: Internal Medicine

## 2021-05-17 ENCOUNTER — Other Ambulatory Visit: Payer: Self-pay | Admitting: Internal Medicine

## 2021-05-17 DIAGNOSIS — R9389 Abnormal findings on diagnostic imaging of other specified body structures: Secondary | ICD-10-CM

## 2021-05-27 ENCOUNTER — Ambulatory Visit (HOSPITAL_BASED_OUTPATIENT_CLINIC_OR_DEPARTMENT_OTHER): Payer: BC Managed Care – PPO

## 2021-05-27 ENCOUNTER — Other Ambulatory Visit: Payer: Self-pay

## 2021-05-27 ENCOUNTER — Encounter (HOSPITAL_BASED_OUTPATIENT_CLINIC_OR_DEPARTMENT_OTHER): Payer: Self-pay

## 2021-05-27 ENCOUNTER — Ambulatory Visit (HOSPITAL_BASED_OUTPATIENT_CLINIC_OR_DEPARTMENT_OTHER)
Admission: RE | Admit: 2021-05-27 | Discharge: 2021-05-27 | Disposition: A | Discharge status: Home / Self Care | Payer: BC Managed Care – PPO | Source: Ambulatory Visit | Attending: Internal Medicine | Admitting: Internal Medicine

## 2021-05-27 DIAGNOSIS — R9389 Abnormal findings on diagnostic imaging of other specified body structures: Secondary | ICD-10-CM | POA: Diagnosis not present

## 2021-05-27 LAB — POCT I-STAT CREATININE: Creatinine, Ser: 1 mg/dL (ref 0.61–1.24)

## 2021-05-27 MED ORDER — IOHEXOL 300 MG/ML  SOLN
100.0000 mL | Freq: Once | INTRAMUSCULAR | Status: AC | PRN
  Administered 2021-05-27: 100 mL via INTRAVENOUS

## 2021-06-20 ENCOUNTER — Emergency Department (HOSPITAL_COMMUNITY): Payer: BC Managed Care – PPO

## 2021-06-20 ENCOUNTER — Other Ambulatory Visit: Payer: Self-pay

## 2021-06-20 ENCOUNTER — Emergency Department (HOSPITAL_COMMUNITY): Payer: BC Managed Care – PPO | Admitting: Certified Registered Nurse Anesthetist

## 2021-06-20 ENCOUNTER — Ambulatory Visit (HOSPITAL_COMMUNITY)
Admission: EM | Admit: 2021-06-20 | Discharge: 2021-06-20 | Disposition: A | Discharge status: Home / Self Care | Payer: BC Managed Care – PPO | Attending: Emergency Medicine | Admitting: Emergency Medicine

## 2021-06-20 ENCOUNTER — Encounter (HOSPITAL_COMMUNITY): Payer: Self-pay | Admitting: Emergency Medicine

## 2021-06-20 ENCOUNTER — Encounter (HOSPITAL_COMMUNITY)
Admission: EM | Disposition: A | Discharge status: Home / Self Care | Payer: Self-pay | Source: Home / Self Care | Attending: Emergency Medicine

## 2021-06-20 DIAGNOSIS — Z20822 Contact with and (suspected) exposure to covid-19: Secondary | ICD-10-CM | POA: Insufficient documentation

## 2021-06-20 DIAGNOSIS — Z87891 Personal history of nicotine dependence: Secondary | ICD-10-CM | POA: Diagnosis not present

## 2021-06-20 DIAGNOSIS — Z9049 Acquired absence of other specified parts of digestive tract: Secondary | ICD-10-CM

## 2021-06-20 DIAGNOSIS — I4891 Unspecified atrial fibrillation: Secondary | ICD-10-CM | POA: Insufficient documentation

## 2021-06-20 DIAGNOSIS — I509 Heart failure, unspecified: Secondary | ICD-10-CM | POA: Insufficient documentation

## 2021-06-20 DIAGNOSIS — I110 Hypertensive heart disease with heart failure: Secondary | ICD-10-CM | POA: Insufficient documentation

## 2021-06-20 DIAGNOSIS — Z8619 Personal history of other infectious and parasitic diseases: Secondary | ICD-10-CM | POA: Insufficient documentation

## 2021-06-20 DIAGNOSIS — K35891 Other acute appendicitis without perforation, with gangrene: Secondary | ICD-10-CM | POA: Diagnosis present

## 2021-06-20 DIAGNOSIS — K3580 Unspecified acute appendicitis: Secondary | ICD-10-CM

## 2021-06-20 DIAGNOSIS — K3531 Acute appendicitis with localized peritonitis and gangrene, without perforation: Secondary | ICD-10-CM | POA: Insufficient documentation

## 2021-06-20 HISTORY — PX: LAPAROSCOPIC APPENDECTOMY: SHX408

## 2021-06-20 LAB — URINALYSIS, ROUTINE W REFLEX MICROSCOPIC
Bilirubin Urine: NEGATIVE
Glucose, UA: NEGATIVE mg/dL
Hgb urine dipstick: NEGATIVE
Ketones, ur: NEGATIVE mg/dL
Leukocytes,Ua: NEGATIVE
Nitrite: NEGATIVE
Protein, ur: NEGATIVE mg/dL
Specific Gravity, Urine: 1.015 (ref 1.005–1.030)
pH: 7 (ref 5.0–8.0)

## 2021-06-20 LAB — CBC WITH DIFFERENTIAL/PLATELET
Abs Immature Granulocytes: 0.03 10*3/uL (ref 0.00–0.07)
Basophils Absolute: 0.1 10*3/uL (ref 0.0–0.1)
Basophils Relative: 1 %
Eosinophils Absolute: 0.1 10*3/uL (ref 0.0–0.5)
Eosinophils Relative: 1 %
HCT: 39.8 % (ref 39.0–52.0)
Hemoglobin: 13.6 g/dL (ref 13.0–17.0)
Immature Granulocytes: 0 %
Lymphocytes Relative: 11 %
Lymphs Abs: 1 10*3/uL (ref 0.7–4.0)
MCH: 31.9 pg (ref 26.0–34.0)
MCHC: 34.2 g/dL (ref 30.0–36.0)
MCV: 93.4 fL (ref 80.0–100.0)
Monocytes Absolute: 0.5 10*3/uL (ref 0.1–1.0)
Monocytes Relative: 6 %
Neutro Abs: 7.2 10*3/uL (ref 1.7–7.7)
Neutrophils Relative %: 81 %
Platelets: 197 10*3/uL (ref 150–400)
RBC: 4.26 MIL/uL (ref 4.22–5.81)
RDW: 12.5 % (ref 11.5–15.5)
WBC: 8.8 10*3/uL (ref 4.0–10.5)
nRBC: 0 % (ref 0.0–0.2)

## 2021-06-20 LAB — COMPREHENSIVE METABOLIC PANEL
ALT: 36 U/L (ref 0–44)
AST: 32 U/L (ref 15–41)
Albumin: 4.2 g/dL (ref 3.5–5.0)
Alkaline Phosphatase: 88 U/L (ref 38–126)
Anion gap: 10 (ref 5–15)
BUN: 15 mg/dL (ref 6–20)
CO2: 24 mmol/L (ref 22–32)
Calcium: 10 mg/dL (ref 8.9–10.3)
Chloride: 105 mmol/L (ref 98–111)
Creatinine, Ser: 0.88 mg/dL (ref 0.61–1.24)
GFR, Estimated: >60 mL/min (ref >60)
Glucose, Bld: 137 mg/dL — ABNORMAL HIGH (ref 70–99)
Potassium: 3.9 mmol/L (ref 3.5–5.1)
Sodium: 139 mmol/L (ref 135–145)
Total Bilirubin: 0.7 mg/dL (ref 0.3–1.2)
Total Protein: 7.3 g/dL (ref 6.5–8.1)

## 2021-06-20 LAB — LIPASE, BLOOD: Lipase: 28 U/L (ref 11–51)

## 2021-06-20 LAB — RESP PANEL BY RT-PCR (FLU A&B, COVID) ARPGX2
Influenza A by PCR: NEGATIVE
Influenza B by PCR: NEGATIVE
SARS Coronavirus 2 by RT PCR: NEGATIVE

## 2021-06-20 SURGERY — APPENDECTOMY, LAPAROSCOPIC
Anesthesia: General

## 2021-06-20 MED ORDER — ROCURONIUM BROMIDE 10 MG/ML (PF) SYRINGE
PREFILLED_SYRINGE | INTRAVENOUS | Status: DC | PRN
  Administered 2021-06-20: 80 mg via INTRAVENOUS

## 2021-06-20 MED ORDER — ACETAMINOPHEN 10 MG/ML IV SOLN
INTRAVENOUS | Status: AC
  Filled 2021-06-20: qty 100

## 2021-06-20 MED ORDER — PROPOFOL 10 MG/ML IV BOLUS
INTRAVENOUS | Status: DC | PRN
  Administered 2021-06-20: 150 mg via INTRAVENOUS
  Administered 2021-06-20: 50 mg via INTRAVENOUS

## 2021-06-20 MED ORDER — DEXAMETHASONE SODIUM PHOSPHATE 10 MG/ML IJ SOLN
INTRAMUSCULAR | Status: DC | PRN
  Administered 2021-06-20: 8 mg via INTRAVENOUS

## 2021-06-20 MED ORDER — FENTANYL CITRATE (PF) 100 MCG/2ML IJ SOLN
INTRAMUSCULAR | Status: AC
  Filled 2021-06-20: qty 2

## 2021-06-20 MED ORDER — AMOXICILLIN-POT CLAVULANATE 875-125 MG PO TABS: 1.0000 | ORAL_TABLET | Freq: Two times a day (BID) | ORAL | 0 refills | Status: AC

## 2021-06-20 MED ORDER — SUGAMMADEX SODIUM 200 MG/2ML IV SOLN
INTRAVENOUS | Status: DC | PRN
  Administered 2021-06-20: 200 mg via INTRAVENOUS

## 2021-06-20 MED ORDER — PROPOFOL 10 MG/ML IV BOLUS
INTRAVENOUS | Status: AC
  Filled 2021-06-20: qty 20

## 2021-06-20 MED ORDER — FENTANYL CITRATE PF 50 MCG/ML IJ SOSY: 25.0000 ug | PREFILLED_SYRINGE | INTRAMUSCULAR | Status: DC | PRN

## 2021-06-20 MED ORDER — PHENYLEPHRINE 40 MCG/ML (10ML) SYRINGE FOR IV PUSH (FOR BLOOD PRESSURE SUPPORT)
PREFILLED_SYRINGE | INTRAVENOUS | Status: DC | PRN
  Administered 2021-06-20: 80 ug via INTRAVENOUS
  Administered 2021-06-20: 160 ug via INTRAVENOUS
  Administered 2021-06-20: 120 ug via INTRAVENOUS
  Administered 2021-06-20: 40 ug via INTRAVENOUS

## 2021-06-20 MED ORDER — IOHEXOL 300 MG/ML  SOLN
100.0000 mL | Freq: Once | INTRAMUSCULAR | Status: AC | PRN
  Administered 2021-06-20: 100 mL via INTRAVENOUS

## 2021-06-20 MED ORDER — PIPERACILLIN-TAZOBACTAM 3.375 G IVPB 30 MIN
3.3750 g | Freq: Once | INTRAVENOUS | Status: AC
  Administered 2021-06-20: 3.375 g via INTRAVENOUS
  Filled 2021-06-20: qty 50

## 2021-06-20 MED ORDER — OXYCODONE HCL 5 MG PO TABS: 5.0000 mg | ORAL_TABLET | Freq: Four times a day (QID) | ORAL | 0 refills | Status: AC | PRN

## 2021-06-20 MED ORDER — ROCURONIUM BROMIDE 10 MG/ML (PF) SYRINGE
PREFILLED_SYRINGE | INTRAVENOUS | Status: AC
  Filled 2021-06-20: qty 10

## 2021-06-20 MED ORDER — LIDOCAINE HCL (PF) 2 % IJ SOLN
INTRAMUSCULAR | Status: AC
  Filled 2021-06-20: qty 5

## 2021-06-20 MED ORDER — DOCUSATE SODIUM 100 MG PO CAPS: 100.0000 mg | ORAL_CAPSULE | Freq: Every day | ORAL | Status: DC | PRN

## 2021-06-20 MED ORDER — ACETAMINOPHEN 500 MG PO TABS: 1000.0000 mg | ORAL_TABLET | Freq: Four times a day (QID) | ORAL | Status: DC | PRN

## 2021-06-20 MED ORDER — ONDANSETRON HCL 4 MG/2ML IJ SOLN
INTRAMUSCULAR | Status: AC
  Filled 2021-06-20: qty 2

## 2021-06-20 MED ORDER — LACTATED RINGERS IV SOLN: INTRAVENOUS | Status: DC

## 2021-06-20 MED ORDER — ACETAMINOPHEN 500 MG PO TABS: 1000.0000 mg | ORAL_TABLET | Freq: Once | ORAL | Status: DC

## 2021-06-20 MED ORDER — BUPIVACAINE-EPINEPHRINE (PF) 0.25% -1:200000 IJ SOLN
INTRAMUSCULAR | Status: AC
  Filled 2021-06-20: qty 30

## 2021-06-20 MED ORDER — SODIUM CHLORIDE 0.9 % IV BOLUS
1000.0000 mL | Freq: Once | INTRAVENOUS | Status: AC
Start: 2021-06-20 — End: 2021-06-20
  Administered 2021-06-20: 1000 mL via INTRAVENOUS

## 2021-06-20 MED ORDER — CHLORHEXIDINE GLUCONATE 0.12 % MT SOLN
15.0000 mL | Freq: Once | OROMUCOSAL | Status: AC
  Administered 2021-06-20: 15 mL via OROMUCOSAL

## 2021-06-20 MED ORDER — ONDANSETRON HCL 4 MG/2ML IJ SOLN
INTRAMUSCULAR | Status: DC | PRN
  Administered 2021-06-20: 4 mg via INTRAVENOUS

## 2021-06-20 MED ORDER — FENTANYL CITRATE (PF) 100 MCG/2ML IJ SOLN
INTRAMUSCULAR | Status: DC | PRN
  Administered 2021-06-20 (×2): 50 ug via INTRAVENOUS

## 2021-06-20 MED ORDER — MIDAZOLAM HCL 2 MG/2ML IJ SOLN
INTRAMUSCULAR | Status: AC
  Filled 2021-06-20: qty 2

## 2021-06-20 MED ORDER — FENTANYL CITRATE PF 50 MCG/ML IJ SOSY
50.0000 ug | PREFILLED_SYRINGE | Freq: Once | INTRAMUSCULAR | Status: AC
  Administered 2021-06-20: 50 ug via INTRAVENOUS
  Filled 2021-06-20: qty 1

## 2021-06-20 MED ORDER — BUPIVACAINE-EPINEPHRINE 0.25% -1:200000 IJ SOLN
INTRAMUSCULAR | Status: DC | PRN
Start: 2021-06-20 — End: 2021-06-20
  Administered 2021-06-20: 14 mL

## 2021-06-20 MED ORDER — LIDOCAINE 2% (20 MG/ML) 5 ML SYRINGE
INTRAMUSCULAR | Status: DC | PRN
  Administered 2021-06-20: 60 mg via INTRAVENOUS
  Administered 2021-06-20: 40 mg via INTRAVENOUS

## 2021-06-20 MED ORDER — MIDAZOLAM HCL 5 MG/5ML IJ SOLN
INTRAMUSCULAR | Status: DC | PRN
  Administered 2021-06-20: 2 mg via INTRAVENOUS

## 2021-06-20 MED ORDER — DEXAMETHASONE SODIUM PHOSPHATE 10 MG/ML IJ SOLN
INTRAMUSCULAR | Status: AC
  Filled 2021-06-20: qty 1

## 2021-06-20 MED ORDER — ACETAMINOPHEN 10 MG/ML IV SOLN
INTRAVENOUS | Status: DC | PRN
  Administered 2021-06-20: 1000 mg via INTRAVENOUS

## 2021-06-20 SURGICAL SUPPLY — 48 items
APPLIER CLIP 5 13 M/L LIGAMAX5 (MISCELLANEOUS)
APPLIER CLIP ROT 10 11.4 M/L (STAPLE)
BAG COUNTER SPONGE SURGICOUNT (BAG) IMPLANT
CABLE HIGH FREQUENCY MONO STRZ (ELECTRODE) IMPLANT
CHLORAPREP W/TINT 26 (MISCELLANEOUS) ×2 IMPLANT
CLIP APPLIE 5 13 M/L LIGAMAX5 (MISCELLANEOUS) IMPLANT
CLIP APPLIE ROT 10 11.4 M/L (STAPLE) IMPLANT
COVER SURGICAL LIGHT HANDLE (MISCELLANEOUS) ×2 IMPLANT
CUTTER FLEX LINEAR 45M (STAPLE) ×1 IMPLANT
DERMABOND ADVANCED (GAUZE/BANDAGES/DRESSINGS) ×1
DERMABOND ADVANCED .7 DNX12 (GAUZE/BANDAGES/DRESSINGS) ×1 IMPLANT
DRAIN CHANNEL 19F RND (DRAIN) IMPLANT
ELECT REM PT RETURN 15FT ADLT (MISCELLANEOUS) ×2 IMPLANT
ENDOLOOP SUT PDS II  0 18 (SUTURE)
ENDOLOOP SUT PDS II 0 18 (SUTURE) IMPLANT
EVACUATOR SILICONE 100CC (DRAIN) IMPLANT
GLOVE SURG ENC MOIS LTX SZ6 (GLOVE) ×2 IMPLANT
GLOVE SURG MICRO LTX SZ6 (GLOVE) ×2 IMPLANT
GLOVE SURG UNDER LTX SZ6.5 (GLOVE) ×2 IMPLANT
GOWN STRL REUS W/ TWL LRG LVL3 (GOWN DISPOSABLE) ×1 IMPLANT
GOWN STRL REUS W/TWL LRG LVL3 (GOWN DISPOSABLE) ×1
GOWN STRL REUS W/TWL XL LVL3 (GOWN DISPOSABLE) ×2 IMPLANT
GRASPER SUT TROCAR 14GX15 (MISCELLANEOUS) ×1 IMPLANT
IRRIG SUCT STRYKERFLOW 2 WTIP (MISCELLANEOUS) ×2
IRRIGATION SUCT STRKRFLW 2 WTP (MISCELLANEOUS) ×1 IMPLANT
KIT BASIN OR (CUSTOM PROCEDURE TRAY) ×2 IMPLANT
KIT TURNOVER KIT A (KITS) IMPLANT
NDL INSUFFLATION 14GA 120MM (NEEDLE) ×1 IMPLANT
NEEDLE INSUFFLATION 14GA 120MM (NEEDLE) ×2 IMPLANT
POUCH SPECIMEN RETRIEVAL 10MM (ENDOMECHANICALS) ×2 IMPLANT
RELOAD 45 VASCULAR/THIN (ENDOMECHANICALS) IMPLANT
RELOAD STAPLE 45 2.5 WHT GRN (ENDOMECHANICALS) IMPLANT
RELOAD STAPLE 45 3.5 BLU ETS (ENDOMECHANICALS) IMPLANT
RELOAD STAPLE TA45 3.5 REG BLU (ENDOMECHANICALS) ×2 IMPLANT
SCISSORS LAP 5X35 DISP (ENDOMECHANICALS) IMPLANT
SET TUBE SMOKE EVAC HIGH FLOW (TUBING) ×2 IMPLANT
SHEARS HARMONIC ACE PLUS 36CM (ENDOMECHANICALS) ×2 IMPLANT
SLEEVE XCEL OPT CAN 5 100 (ENDOMECHANICALS) ×2 IMPLANT
SPIKE FLUID TRANSFER (MISCELLANEOUS) ×2 IMPLANT
SUT ETHILON 2 0 PS N (SUTURE) IMPLANT
SUT MNCRL AB 4-0 PS2 18 (SUTURE) ×2 IMPLANT
TOWEL OR 17X26 10 PK STRL BLUE (TOWEL DISPOSABLE) ×2 IMPLANT
TOWEL OR NON WOVEN STRL DISP B (DISPOSABLE) ×2 IMPLANT
TRAY FOLEY MTR SLVR 14FR STAT (SET/KITS/TRAYS/PACK) IMPLANT
TRAY FOLEY MTR SLVR 16FR STAT (SET/KITS/TRAYS/PACK) ×1 IMPLANT
TRAY LAPAROSCOPIC (CUSTOM PROCEDURE TRAY) ×2 IMPLANT
TROCAR BLADELESS OPT 5 100 (ENDOMECHANICALS) ×2 IMPLANT
TROCAR XCEL 12X100 BLDLESS (ENDOMECHANICALS) ×2 IMPLANT

## 2021-06-20 NOTE — Discharge Instructions (Addendum)
[facility] ______[facility] LAPAROSCOPIC SURGERY: POST OP INSTRUCTIONS Always review your discharge instruction sheet given to you by the facility where your surgery was performed. IF YOU HAVE DISABILITY OR FAMILY LEAVE FORMS, YOU MUST BRING THEM TO THE OFFICE FOR PROCESSING.   DO NOT GIVE THEM TO YOUR DOCTOR.  A prescription for pain medication may be given to you upon discharge.  Take your pain medication as prescribed, if needed.  If narcotic pain medicine is not needed, then you may take acetaminophen (Tylenol) or ibuprofen (Advil) as needed. Take your usually prescribed medications unless otherwise directed. If you need a refill on your pain medication, please contact your pharmacy.  They will contact our office to request authorization. Prescriptions will not be filled after 5pm or on week-ends. You should follow a light diet the first few days after arrival home, such as soup and crackers, etc.  Be sure to include lots of fluids daily. Most patients will experience some swelling and bruising in the area of the incisions.  Ice packs will help.  Swelling and bruising can take several days to resolve.  It is common to experience some constipation if taking pain medication after surgery.  Increasing fluid intake and taking a stool softener (such as Colace) will usually help or prevent this problem from occurring.  A mild laxative (Milk of Magnesia or Miralax) should be taken according to package instructions if there are no bowel movements after 48 hours. Unless discharge instructions indicate otherwise, you may remove your bandages 24-48 hours after surgery, and you may shower at that time.  You may have steri-strips (small skin tapes) in place directly over the incision.  These strips should be left on the skin for 7-10 days.  If your surgeon used skin glue on the incision, you may shower in 24 hours.  The glue will flake off over the next 2-3 weeks.  Any sutures or staples will be  removed at the office during your follow-up visit. ACTIVITIES:  You may resume regular (light) daily activities beginning the next day--such as daily self-care, walking, climbing stairs--gradually increasing activities as tolerated.  You may have sexual intercourse when it is comfortable.  Refrain from any heavy lifting or straining until approved by your doctor. You may drive when you are no longer taking prescription pain medication, you can comfortably wear a seatbelt, and you can safely maneuver your car and apply brakes. You should see your doctor in the office for a follow-up appointment approximately 2-3 weeks after your surgery.  Make sure that you call for this appointment within a day or two after you arrive home to insure a convenient appointment time. OTHER INSTRUCTIONS: __________________________________________________________________________________________________________________________ __________________________________________________________________________________________________________________________ WHEN TO CALL YOUR DOCTOR: Fever over 101.0 Inability to urinate Continued bleeding from incision. Increased pain, redness, or drainage from the incision. Increasing abdominal pain  The clinic staff is available to answer your questions during regular business hours.  Please dont hesitate to call and ask to speak to one of the nurses for clinical concerns.  If you have a medical emergency, go to the nearest emergency room or call 911.  A surgeon from [facility] is always on call at the hospital. [address], [place], [place]  [address] ? [address] [address], [place], [place]   [address] [phone] ? [phone] ? FAX [phone] Web site: [email or web address]    Managing Your Pain After Surgery Without Opioids    Thank you for participating in our program to  help patients manage their pain after surgery without opioids. This is part  of our effort to provide you with the best care possible, without exposing you or your family to the risk that opioids pose.  What pain can I expect after surgery? You can expect to have some pain after surgery. This is normal. The pain is typically worse the day after surgery, and quickly begins to get better. Many studies have found that many patients are able to manage their pain after surgery with Over-the-Counter (OTC) medications such as Tylenol and Motrin. If you have a condition that does not allow you to take Tylenol or Motrin, notify your surgical team.  How will I manage my pain? The best strategy for controlling your pain after surgery is around the clock pain control with Tylenol (acetaminophen) and Motrin (ibuprofen or Advil). Alternating these medications with each other allows you to maximize your pain control. In addition to Tylenol and Motrin, you can use heating pads or ice packs on your incisions to help reduce your pain.  How will I alternate your regular strength over-the-counter pain medication? You will take a dose of pain medication every three hours. Start by taking 650 mg of Tylenol (2 pills of 325 mg) 3 hours later take 600 mg of Motrin (3 pills of 200 mg) 3 hours after taking the Motrin take 650 mg of Tylenol 3 hours after that take 600 mg of Motrin.   - 1 -  See example - if your first dose of Tylenol is at 12:00 PM   12:00 PM Tylenol 650 mg (2 pills of 325 mg)  3:00 PM Motrin 600 mg (3 pills of 200 mg)  6:00 PM Tylenol 650 mg (2 pills of 325 mg)  9:00 PM Motrin 600 mg (3 pills of 200 mg)  Continue alternating every 3 hours   We recommend that you follow this schedule around-the-clock for at least 3 days after surgery, or until you feel that it is no longer needed. Use the table on the last page of this handout to keep track of the medications you are taking. Important: Do not take more than 3000mg  of Tylenol or 3200mg  of Motrin in a 24-hour period. Do not  take ibuprofen/Motrin if you have a history of bleeding stomach ulcers, severe kidney disease, &/or actively taking a blood thinner  What if I still have pain? If you have pain that is not controlled with the over-the-counter pain medications (Tylenol and Motrin or Advil) you might have what we call breakthrough pain. You will receive a prescription for a small amount of an opioid pain medication such as Oxycodone, Tramadol, or Tylenol with Codeine. Use these opioid pills in the first 24 hours after surgery if you have breakthrough pain. Do not take more than 1 pill every 4-6 hours.  If you still have uncontrolled pain after using all opioid pills, don't hesitate to call our staff using the number provided. We will help make sure you are managing your pain in the best way possible, and if necessary, we can provide a prescription for additional pain medication.   Day 1    Time  Name of Medication Number of pills taken  Amount of Acetaminophen  Pain Level   Comments  AM PM       AM PM       AM PM       AM PM       AM PM       AM  PM       AM PM       AM PM       Total Daily amount of Acetaminophen Do not take more than  3,000 mg per day      Day 2    Time  Name of Medication Number of pills taken  Amount of Acetaminophen  Pain Level   Comments  AM PM       AM PM       AM PM       AM PM       AM PM       AM PM       AM PM       AM PM       Total Daily amount of Acetaminophen Do not take more than  3,000 mg per day      Day 3    Time  Name of Medication Number of pills taken  Amount of Acetaminophen  Pain Level   Comments  AM PM       AM PM       AM PM       AM PM          AM PM       AM PM       AM PM       AM PM       Total Daily amount of Acetaminophen Do not take more than  3,000 mg per day      Day 4    Time  Name of Medication Number of pills taken  Amount of Acetaminophen  Pain Level   Comments  AM PM       AM PM       AM PM        AM PM       AM PM       AM PM       AM PM       AM PM       Total Daily amount of Acetaminophen Do not take more than  3,000 mg per day      Day 5    Time  Name of Medication Number of pills taken  Amount of Acetaminophen  Pain Level   Comments  AM PM       AM PM       AM PM       AM PM       AM PM       AM PM       AM PM       AM PM       Total Daily amount of Acetaminophen Do not take more than  3,000 mg per day       Day 6    Time  Name of Medication Number of pills taken  Amount of Acetaminophen  Pain Level  Comments  AM PM       AM PM       AM PM       AM PM       AM PM       AM PM       AM PM       AM PM       Total Daily amount of Acetaminophen Do not take more than  3,000 mg per day      Day 7    Time  Name  of Medication Number of pills taken  Amount of Acetaminophen  Pain Level   Comments  AM PM       AM PM       AM PM       AM PM       AM PM       AM PM       AM PM       AM PM       Total Daily amount of Acetaminophen Do not take more than  3,000 mg per day        For additional information about how and where to safely dispose of unused opioid medications - [employer]  Disclaimer: This document contains information and/or instructional materials adapted from [facility] for the typical patient with your condition. It does not replace medical advice from your health care provider because your experience may differ from that of the typical patient. Talk to your health care provider if you have any questions about this document, your condition or your treatment plan. Adapted from [place]

## 2021-06-20 NOTE — Anesthesia Postprocedure Evaluation (Signed)
Anesthesia Post Note  Patient: Jared [name]  Procedure(s) Performed: APPENDECTOMY LAPAROSCOPIC     Patient location during evaluation: PACU Anesthesia Type: General Level of consciousness: awake and alert Pain management: pain level controlled Vital Signs Assessment: post-procedure vital signs reviewed and stable Respiratory status: spontaneous breathing, nonlabored ventilation, respiratory function stable and patient connected to nasal cannula oxygen Cardiovascular status: blood pressure returned to baseline and stable Postop Assessment: no apparent nausea or vomiting Anesthetic complications: no   No notable events documented.  Last Vitals:  Vitals:   06/20/21 1530 06/20/21 1545  BP: (!) 146/79 (!) 159/88  Pulse: 64 67  Resp: 10 16  Temp:  36.9 C  SpO2: 96% 95%    Last Pain:  Vitals:   06/20/21 1545  TempSrc:   PainSc: 0-No pain                 Alexi Dorminey L Shamiracle Gorden

## 2021-06-20 NOTE — Transfer of Care (Signed)
Immediate Anesthesia Transfer of Care Note  Patient: Jared [name]  Procedure(s) Performed: APPENDECTOMY LAPAROSCOPIC  Patient Location: PACU  Anesthesia Type:General  Level of Consciousness: awake  Airway & Oxygen Therapy: Patient Spontanous Breathing and Patient connected to face mask oxygen  Post-op Assessment: Report given to RN and Post -op Vital signs reviewed and stable  Post vital signs: Reviewed and stable  Last Vitals:  Vitals Value Taken Time  BP 158/85 06/20/21 1500  Temp 37.1 C 06/20/21 1455  Pulse 85 06/20/21 1509  Resp 12 06/20/21 1509  SpO2 94 % 06/20/21 1509  Vitals shown include unvalidated device data.  Last Pain:  Vitals:   06/20/21 1500  TempSrc:   PainSc: Asleep      Patients Stated Pain Goal: 0 (06/20/21 1323)  Complications: No notable events documented.

## 2021-06-20 NOTE — ED Triage Notes (Signed)
Pt reports lower right abdominal pain all night. Pt reports he does still have his appendix. Denies N/V. Increased urination

## 2021-06-20 NOTE — Op Note (Signed)
Operative Report  Jared [name] 61 y.o. male  [id number]  [id number]  06/20/2021  Surgeon: Romana Juniper MD FACS   Procedure performed: Laparoscopic Appendectomy   Preop diagnosis: Acute appendicitis   Post-op diagnosis/intraop findings: Acute gangrenous appendicitis with localized and pelvic purulent peritonitis, no overt perforation   Specimens: appendix   EBL: minimal   Complications: none   Description of procedure: After obtaining informed consent the patient was brought to the operating room. Antibiotics had been administered. SCD's were applied. General endotracheal anesthesia was initiated and a formal time-out was performed.  Foley catheter inserted under sterile conditions which was removed at the end of the case.  The abdomen was prepped and draped in the usual sterile fashion and the abdomen was entered using an infraumbilical Veress needle and insufflated to 15 mmHg. A 5 mm trocar and camera were then introduced, the abdomen was inspected and there is no evidence of injury from our entry. A suprapubic 5 mm trocar and a left lower quadrant 12 mm trocar were introduced under direct visualization following infiltration with local. The patient was then placed in Trendelenburg and rotated to the left and the small bowel was reflected cephalad. The appendix was visualized: It is gangrenous up to the base where there is a small purulent rind and there is localized pus/purulent ascites in the right lower quadrant and pelvis. The appendix was retrocecal. A combination of blunt dissection and harmonic scalpel were used to free it of its retroperitoneal attachments as well as to mobilize the terminal ileum and cecum slightly. Great care was taken to ensure no injury to surrounding retroperitoneal structures, cecum or terminal ileum. A window was created at the base of the appendix and a blue load linear cutting stapler was used to transect the appendix from the cecum, taking a small cuff of  viable cecum with the specimen.  The harmonic scalpel was then used to transect the appendiceal mesentery. Hemostasis was ensured. The appendix was placed in an Ecosac and removed through our 12 mm trocar site.  The right lower quadrant was again inspected for hemostasis.  The staple line is viable, intact and hemostatic.  The small bowel was run from the ileocecal valve proximally several feet with no abnormalities identified.  The remainder of the visible peritoneal cavity was without overt abnormality.  The purulent fluid in the pelvis and right lower quadrant was aspirated and sparingly irrigated.  The 16mm trocar site in the left lower quadrant was closed with a 0 vicryl in the fascia under direct visualization using a PMI device. The abdomen was desufflated and all trocars removed. The skin incisions were closed with subcuticular 4-0 monocryl and Dermabond. The patient was awakened, extubated and transported to the recovery room in stable condition.    All counts were correct at the completion of the case.

## 2021-06-20 NOTE — Anesthesia Preprocedure Evaluation (Addendum)
Anesthesia Evaluation  Patient identified by MRN, date of birth, ID band Patient awake    Reviewed: Allergy & Precautions, NPO status , Patient's Chart, lab work & pertinent test results  Airway Mallampati: II  TM Distance: >3 FB Neck ROM: Full    Dental no notable dental hx. (+) Dental Advisory Given, Chipped,    Pulmonary neg pulmonary ROS, former smoker,    Pulmonary exam normal breath sounds clear to auscultation       Cardiovascular hypertension, Pt. on medications +CHF  Normal cardiovascular exam+ dysrhythmias Atrial Fibrillation  Rhythm:Regular Rate:Normal  TTE 2017 - Left ventricle: The cavity size was mildly dilated. Systolic  function was normal. The estimated ejection fraction was in the  range of 55% to 60%. Wall motion was normal; there were no  regional wall motion abnormalities. Features are consistent with  a pseudonormal left ventricular filling pattern, with concomitant  abnormal relaxation and increased filling pressure (grade 2  diastolic dysfunction).  - Aortic valve: There was mild regurgitation.  - Atrial septum: There was increased thickness of the septum,  consistent with lipomatous hypertrophy.   Neuro/Psych PSYCHIATRIC DISORDERS Anxiety Depression negative neurological ROS     GI/Hepatic PUD, GERD  ,(+)     substance abuse  alcohol use,   Endo/Other  negative endocrine ROS  Renal/GU negative Renal ROS  negative genitourinary   Musculoskeletal negative musculoskeletal ROS (+)   Abdominal   Peds  Hematology negative hematology ROS (+)   Anesthesia Other Findings   Reproductive/Obstetrics                            Anesthesia Physical Anesthesia Plan  ASA: 3  Anesthesia Plan: General   Post-op Pain Management:    Induction: Intravenous  PONV Risk Score and Plan: 2 and Midazolam, Dexamethasone and Ondansetron  Airway Management Planned:  Oral ETT  Additional Equipment:   Intra-op Plan:   Post-operative Plan: Extubation in OR  Informed Consent: I have reviewed the patients History and Physical, chart, labs and discussed the procedure including the risks, benefits and alternatives for the proposed anesthesia with the patient or authorized representative who has indicated his/her understanding and acceptance.     Dental advisory given  Plan Discussed with: CRNA  Anesthesia Plan Comments:         Anesthesia Quick Evaluation

## 2021-06-20 NOTE — ED Notes (Signed)
Marcelino Duster, EMT from OR in room to take patient to OR

## 2021-06-20 NOTE — H&P (Signed)
History and Physical  Jared [name] [birth year]  [id number].    Requesting MD: Dr. Eulis Foster Chief Complaint/Reason for Consult: acute appendicitis  HPI:  61 year old male with past medical history significant for hypertension, PVCs, bilateral inguinal hernia status post repair presented to the [facility] emergency department secondary to acute onset right lower quadrant pain.  Pain started last night and has been worsening since.  He denies associated fever, chills, nausea, vomiting, constipation, diarrhea.  He had a recent CT scan completed per his PCP due to concern about his hernias recurring.  CT was negative for hernia recurrence.  He last had anything to eat or drink prior to dinnertime last night  Work-up in ED significant for WBC WNL, afebrile, CT scan showing early acute uncomplicated appendicitis.  Substance use: Former tobacco use Allergies: NKDA Blood thinners: None  ROS: Review of Systems  Constitutional:  Negative for chills and fever.  Respiratory:  Negative for cough, shortness of breath and wheezing.   Cardiovascular:  Negative for chest pain, palpitations and leg swelling.  Gastrointestinal:  Positive for abdominal pain. Negative for constipation, diarrhea, nausea and vomiting.  Genitourinary: Negative.    Family History  Problem Relation Age of Onset   Heart disease Mother    Hypertension Mother    Stroke Mother    Heart disease Father    Heart attack Father    Alcohol abuse Paternal Uncle     Past Medical History:  Diagnosis Date   Anxiety    Hypertension    PVC (premature ventricular contraction)     History reviewed. No pertinent surgical history.  Social History:  reports that he has quit smoking. He quit smokeless tobacco use about 25 years ago. He reports that he does not drink alcohol and does not use drugs.  Allergies: No Known Allergies  (Not in a hospital admission)   Blood pressure (!) 168/96, pulse 91, temperature 98.9 F (37.2  C), temperature source Oral, resp. rate 16, SpO2 100 %. Physical Exam: General: pleasant, WD, male who is laying in bed in NAD HEENT: head is normocephalic, atraumatic.  Sclera are noninjected.  Pupils equal and round. EOMs intact.  Ears and nose without any masses or lesions.  Mouth is pink and moist Heart: regular, rate, and rhythm.  Normal s1,s2. No obvious murmurs, gallops, or rubs noted.  Palpable radial and pedal pulses bilaterally Lungs: CTAB, no wheezes, rhonchi, or rales noted.  Respiratory effort nonlabored Abd: soft, ND, +BS, no masses or organomegaly.  Mild tenderness to palpation in right lower quadrant and suprapubic regions.  No rebound, guarding, concerning s/s of peritonitis MSK: all 4 extremities are symmetrical with no cyanosis, clubbing, or edema. Skin: warm and dry with no masses, lesions, or rashes Neuro: Cranial nerves 2-12 grossly intact, sensation is normal throughout Psych: A&Ox3 with an appropriate affect.    Results for orders placed or performed during the hospital encounter of 06/20/21 (from the past 48 hour(s))  CBC with Differential     Status: None   Collection Time: 06/20/21  7:34 AM  Result Value Ref Range   WBC 8.8 4.0 - 10.5 K/uL   RBC 4.26 4.22 - 5.81 MIL/uL   Hemoglobin 13.6 13.0 - 17.0 g/dL   HCT 39.8 39.0 - 52.0 %   MCV 93.4 80.0 - 100.0 fL   MCH 31.9 26.0 - 34.0 pg   MCHC 34.2 30.0 - 36.0 g/dL   RDW 12.5 11.5 - 15.5 %   Platelets 197 150 -  400 K/uL   nRBC 0.0 0.0 - 0.2 %   Neutrophils Relative % 81 %   Neutro Abs 7.2 1.7 - 7.7 K/uL   Lymphocytes Relative 11 %   Lymphs Abs 1.0 0.7 - 4.0 K/uL   Monocytes Relative 6 %   Monocytes Absolute 0.5 0.1 - 1.0 K/uL   Eosinophils Relative 1 %   Eosinophils Absolute 0.1 0.0 - 0.5 K/uL   Basophils Relative 1 %   Basophils Absolute 0.1 0.0 - 0.1 K/uL   Immature Granulocytes 0 %   Abs Immature Granulocytes 0.03 0.00 - 0.07 K/uL    Comment: Performed at [facility], [place] [address], [place], [place] [address]  Comprehensive metabolic panel     Status: Abnormal   Collection Time: 06/20/21  7:34 AM  Result Value Ref Range   Sodium 139 135 - 145 mmol/L   Potassium 3.9 3.5 - 5.1 mmol/L   Chloride 105 98 - 111 mmol/L   CO2 24 22 - 32 mmol/L   Glucose, Bld 137 (H) 70 - 99 mg/dL    Comment: Glucose reference range applies only to samples taken after fasting for at least 8 hours.   BUN 15 6 - 20 mg/dL   Creatinine, Ser 0.88 0.61 - 1.24 mg/dL   Calcium 10.0 8.9 - 10.3 mg/dL   Total Protein 7.3 6.5 - 8.1 g/dL   Albumin 4.2 3.5 - 5.0 g/dL   AST 32 15 - 41 U/L   ALT 36 0 - 44 U/L   Alkaline Phosphatase 88 38 - 126 U/L   Total Bilirubin 0.7 0.3 - 1.2 mg/dL   GFR, Estimated >60 >60 mL/min    Comment: (NOTE) Calculated using the CKD-EPI Creatinine Equation (2021)    Anion gap 10 5 - 15    Comment: Performed at [facility], [place] [address] [place], [place] [address]  Lipase, blood     Status: None   Collection Time: 06/20/21  7:34 AM  Result Value Ref Range   Lipase 28 11 - 51 U/L    Comment: Performed at [facility], [place] [address], [place], [place] [address]  Urinalysis, Routine w reflex microscopic     Status: Abnormal   Collection Time: 06/20/21 10:37 AM  Result Value Ref Range   Color, Urine STRAW (A) YELLOW   APPearance CLEAR CLEAR   Specific Gravity, Urine 1.015 1.005 - 1.030   pH 7.0 5.0 - 8.0   Glucose, UA NEGATIVE NEGATIVE mg/dL   Hgb urine dipstick NEGATIVE NEGATIVE   Bilirubin Urine NEGATIVE NEGATIVE   Ketones, ur NEGATIVE NEGATIVE mg/dL   Protein, ur NEGATIVE NEGATIVE mg/dL   Nitrite NEGATIVE NEGATIVE   Leukocytes,Ua NEGATIVE NEGATIVE    Comment: Performed at [place] [address], [place], [place] [address]   CT ABDOMEN PELVIS W CONTRAST  Result Date: 06/20/2021 CLINICAL DATA:  Right lower abdominal pain. EXAM: CT ABDOMEN AND PELVIS WITH CONTRAST TECHNIQUE: Multidetector CT imaging of  the abdomen and pelvis was performed using the standard protocol following bolus administration of intravenous contrast. RADIATION DOSE REDUCTION: This exam was performed according to the departmental dose-optimization program which includes automated exposure control, adjustment of the mA and/or kV according to patient size and/or use of iterative reconstruction technique. CONTRAST:  126mL OMNIPAQUE IOHEXOL 300 MG/ML  SOLN COMPARISON:  05/27/2021 FINDINGS: Lower chest: No acute abnormality. Hepatobiliary: No focal liver abnormality is seen. No gallstones, gallbladder wall thickening, or biliary dilatation. Pancreas:  Unremarkable. No pancreatic ductal dilatation or surrounding inflammatory changes. Spleen: Normal in size without focal abnormality. Adrenals/Urinary Tract: Adrenal glands are unremarkable. Kidneys are normal, without renal calculi, focal lesion, or hydronephrosis. Bladder is unremarkable. Stomach/Bowel: Stomach is within normal limits. No evidence of bowel wall thickening, distention, or inflammatory changes. Dilated appendix measuring 13 mm with mild periappendiceal inflammatory changes concerning for early acute appendicitis. Vascular/Lymphatic: Aortic atherosclerosis. No enlarged abdominal or pelvic lymph nodes. Reproductive: Prostate is unremarkable. Other: No abdominal wall hernia or abnormality. No abdominopelvic ascites. Musculoskeletal: No acute osseous abnormality. No aggressive osseous lesion. 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 IMPRESSION: 1. Dilated appendix measuring 13 mm with mild periappendiceal inflammatory changes concerning for early acute appendicitis. 2. Aortic Atherosclerosis (ICD10-I70.0). Electronically Signed   By: Kathreen Devoid M.D.   On: 06/20/2021 10:28      Assessment/Plan Acute uncomplicated appendicitis -Imaging and symptoms consistent with acute uncomplicated appendicitis.  Recommend definitive surgical treatment  with laparoscopic appendectomy. I have discussed the procedure and risks of appendectomy. The risks include but are not limited to bleeding, infection, wound problems, anesthesia, injury to intra-abdominal organs, possibility of postoperative ileus. He seems to understand and agrees with the plan.  COVID-negative Possible discharge from PACU  FEN: N.p.o. for OR ID: Zosyn in the ED   I reviewed ED provider notes, last 24 h vitals and pain scores, last 48 h intake and output, last 24 h labs and trends, and last 24 h imaging results.  This care required high  level of medical decision making.   Winferd Humphrey, [facility] Surgery 06/20/2021, 12:48 PM Please see Amion for pager number during day hours 7:00am-4:30pm

## 2021-06-20 NOTE — ED Provider Notes (Signed)
[facility]-EMERGENCY DEPT Provider Note   CSN: [id number] Arrival date & time: 06/20/21  0657     History HX. Hernia repair BL.  Chief Complaint  Patient presents with   Abdominal Pain    Jared [name] is a 61 y.o. male.  61 y.o male with a PMH of bilateral groin hernia repairs presents to the ED with a chief complaint of right lower quadrant pain that began last night.  Patient describes an aching dullness feeling to the right lower quadrant with radiation to the suprapubic region.  Symptoms are exacerbated with lying down, bringing his knees up to his chest.  Alleviated with lying flat on his back.  Does report large bowel movement yesterday, recent CT by PCP which revealed according to patient "appendicolith ".  He did try taking some Gas-X x3, Tums, ibuprofen with some improvement in symptoms.  Patient does have a prior history of H. pylori, is reporting some pain along the upper abdomen as well.  Denies any sick contacts, suspicious food intake.  No nausea, vomiting, diarrhea, or urinary symptoms.   The history is provided by the patient and medical records.  Abdominal Pain Pain location:  RLQ Pain quality: aching and dull   Pain radiates to:  Suprapubic region Pain severity:  Mild Onset quality:  Sudden Duration:  6 hours Timing:  Constant Progression:  Partially resolved Chronicity:  New Context: not awakening from sleep, not laxative use, not recent illness, not sick contacts and not suspicious food intake   Associated symptoms: no chest pain, no chills, no diarrhea, no fever, no nausea, no shortness of breath, no sore throat and no vomiting       Home Medications Prior to Admission medications   Medication Sig Start Date End Date Taking? Authorizing Provider  albuterol (VENTOLIN HFA) 108 (90 Base) MCG/ACT inhaler Inhale 1-2 puffs into the lungs every 6 (six) hours as needed for wheezing or shortness of breath. 03/12/21   Particia Nearing,  PA-C  Ascorbic Acid (VITAMIN C) 1000 MG tablet Take 1,000 mg by mouth daily.    [provider]  aspirin 81 MG tablet Take 81 mg by mouth daily.    [provider]  b complex vitamins tablet Take 1 tablet by mouth daily.    [provider]  Biotin 1000 MCG tablet Take 1,000 mcg by mouth 3 (three) times daily. Patient not taking: Reported on 12/17/2020    [provider]  diltiazem (CARDIZEM CD) 240 MG 24 hr capsule TAKE ONE CAPSULE EACH DAY 01/30/20   Lennette Bihari, MD  diphenhydrAMINE (BENADRYL) 25 MG tablet Take 25 mg by mouth every 6 (six) hours as needed for itching, allergies or sleep.     [provider]  gabapentin (NEURONTIN) 300 MG capsule Take 300 mg by mouth 4 (four) times daily.    [provider]  hydrochlorothiazide (HYDRODIURIL) 25 MG tablet Take 0.5 tablets (12.5 mg total) by mouth daily. 12/17/20 03/17/21  Lennette Bihari, MD  Magnesium 250 MG TABS Take 250 mg by mouth daily.    [provider]  Multiple Vitamins-Minerals (MENS 50+ MULTI VITAMIN/MIN) TABS Take by mouth daily.    [provider]  olmesartan (BENICAR) 40 MG tablet TAKE ONE TABLET DAILY 01/30/20   Lennette Bihari, MD  Potassium 99 MG TABS Take 88 mg by mouth daily.    [provider]  promethazine-dextromethorphan (PROMETHAZINE-DM) 6.25-15 MG/5ML syrup Take 5 mLs by mouth 4 (four) times daily as  needed for cough. 03/12/21   Particia NearingLane, Rachel Elizabeth, PA-C  VITAMIN D PO Take 5,000 Units by mouth daily.    [provider]  Zinc 50 MG TABS Take 50 mg by mouth daily.    [provider]      Allergies    Patient has no known allergies.    Review of Systems   Review of Systems  Constitutional:  Negative for chills and fever.  HENT:  Negative for sore throat.   Respiratory:  Negative for shortness of breath.   Cardiovascular:  Negative for chest pain.  Gastrointestinal:  Positive for abdominal pain. Negative for diarrhea,  nausea and vomiting.  Genitourinary:  Negative for flank pain.  Musculoskeletal:  Negative for back pain.  Neurological:  Negative for headaches.  Psychiatric/Behavioral:  Negative for sleep disturbance.   All other systems reviewed and are negative.  Physical Exam Updated Vital Signs BP (!) 168/96    Pulse 91    Temp 98.9 F (37.2 C) (Oral)    Resp 16    SpO2 100%  Physical Exam Vitals and nursing note reviewed.  Constitutional:      Appearance: He is well-developed.  HENT:     Head: Normocephalic and atraumatic.  Cardiovascular:     Rate and Rhythm: Normal rate.  Pulmonary:     Effort: Pulmonary effort is normal.     Breath sounds: No wheezing.  Abdominal:     General: Abdomen is flat. Bowel sounds are decreased.     Palpations: Abdomen is soft.     Tenderness: There is abdominal tenderness in the right lower quadrant and suprapubic area. There is no right CVA tenderness or left CVA tenderness.  Skin:    General: Skin is warm and dry.  Neurological:     Mental Status: He is alert and oriented to person, place, and time.    ED Results / Procedures / Treatments   Labs (all labs ordered are listed, but only abnormal results are displayed) Labs Reviewed  COMPREHENSIVE METABOLIC PANEL - Abnormal; Notable for the following components:      Result Value   Glucose, Bld 137 (*)    All other components within normal limits  URINALYSIS, ROUTINE W REFLEX MICROSCOPIC - Abnormal; Notable for the following components:   Color, Urine STRAW (*)    All other components within normal limits  RESP PANEL BY RT-PCR (FLU A&B, COVID) [id number]  CBC WITH DIFFERENTIAL/PLATELET  LIPASE, BLOOD    EKG None  Radiology CT ABDOMEN PELVIS W CONTRAST  Result Date: 06/20/2021 CLINICAL DATA:  Right lower abdominal pain. EXAM: CT ABDOMEN AND PELVIS WITH CONTRAST TECHNIQUE: Multidetector CT imaging of the abdomen and pelvis was performed using the standard protocol following bolus administration of  intravenous contrast. RADIATION DOSE REDUCTION: This exam was performed according to the departmental dose-optimization program which includes automated exposure control, adjustment of the mA and/or kV according to patient size and/or use of iterative reconstruction technique. CONTRAST:  100mL OMNIPAQUE IOHEXOL 300 MG/ML  SOLN COMPARISON:  05/27/2021 FINDINGS: Lower chest: No acute abnormality. Hepatobiliary: No focal liver abnormality is seen. No gallstones, gallbladder wall thickening, or biliary dilatation. Pancreas: Unremarkable. No pancreatic ductal dilatation or surrounding inflammatory changes. Spleen: Normal in size without focal abnormality. Adrenals/Urinary Tract: Adrenal glands are unremarkable. Kidneys are normal, without renal calculi, focal lesion, or hydronephrosis. Bladder is unremarkable. Stomach/Bowel: Stomach is within normal limits. No evidence of bowel wall thickening, distention, or inflammatory changes. Dilated appendix measuring 13 mm with mild  periappendiceal inflammatory changes concerning for early acute appendicitis. Vascular/Lymphatic: Aortic atherosclerosis. No enlarged abdominal or pelvic lymph nodes. Reproductive: Prostate is unremarkable. Other: No abdominal wall hernia or abnormality. No abdominopelvic ascites. Musculoskeletal: No acute osseous abnormality. No aggressive osseous lesion. 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 IMPRESSION: 1. Dilated appendix measuring 13 mm with mild periappendiceal inflammatory changes concerning for early acute appendicitis. 2. Aortic Atherosclerosis (ICD10-I70.0). Electronically Signed   By: Elige Ko M.D.   On: 06/20/2021 10:28    Procedures Procedures    Medications Ordered in ED Medications  piperacillin-tazobactam (ZOSYN) IVPB 3.375 g (3.375 g Intravenous New Bag/Given 06/20/21 1247)  sodium chloride 0.9 % bolus 1,000 mL (1,000 mLs Intravenous New Bag/Given 06/20/21 0902)  fentaNYL  (SUBLIMAZE) injection 50 mcg (50 mcg Intravenous Given 06/20/21 0903)  iohexol (OMNIPAQUE) 300 MG/ML solution 100 mL (100 mLs Intravenous Contrast Given 06/20/21 1002)    ED Course/ Medical Decision Making/ A&P                           Medical Decision Making Amount and/or Complexity of Data Reviewed Labs: ordered. Radiology: ordered.  Risk Prescription drug management.  This patient presents to the ED for concern of lower abdominal pain, this involves a number of treatment options, and is a complaint that carries with it a high risk of complications and morbidity.  The differential diagnosis includes appendicitis, obstruction, constipation, viral etiology.    Co morbidities: Discussed in HPI   Brief History:  Patient with a prior history of bilateral hernia repair presents to the ED with worsening right lower quadrant pain that began last night.  Recent CT performed by PCP, which indicated may be recurrence of hernia versus surgical complication.  Also had COVID a few months ago, reports after coughing for 6 weeks, he noticed more pain increasing to the lower abdomen.  EMR reviewed including pt PMHx, past surgical history and past visits to ER.   See HPI for more details   Lab Tests:  I ordered and independently interpreted labs.  The pertinent results include:    I personally reviewed all laboratory work and imaging. Metabolic panel without any acute abnormality specifically kidney function within normal limits and no significant electrolyte abnormalities. CBC without leukocytosis or significant anemia.   Imaging Studies:  CT abdomen and pelvis showed:  1. Dilated appendix measuring 13 mm with mild periappendiceal  inflammatory changes concerning for early acute appendicitis.  2. Aortic Atherosclerosis (ICD10-I70.0).          Medicines ordered:  I ordered medication including fluids, fentanyl for symptomatic control. After CT results, he was given zosyn for  suspected appendicitis.  Reevaluation of the patient after these medicines showed that the patient improved I have reviewed the patients home medicines and have made adjustments as needed  Consults:  I requested consultation with general surgery,  and discussed lab and imaging findings as well as pertinent plan - they recommend: keeping patient NPO.     Reevaluation:  After the interventions noted above I re-evaluated patient and found that they have :improved   Social Determinants of Health:  The patient's social determinants of health were a factor in the care of this patient    Problem List / ED Course:  Patient here with RLQ pain since this morning, previous history of bilateral hernia repair.  Here without any fever, no leukocytosis, last meal this morning.   Dispostion:  After consideration  of the diagnostic results and the patients response to treatment, I feel that the patent would benefit from treatment of his appendicitis.  11:59 AM spoke to general surgery PA Johnny Bridge who will evaluate patient while in the ED. Was admitted by general surgery for further management of his acute appendicitis  Portions of this note were generated with Dragon dictation software. Dictation errors may occur despite best attempts at proofreading.   Final Clinical Impression(s) / ED Diagnoses Final diagnoses:  Acute appendicitis, unspecified acute appendicitis type    Rx / DC Orders ED Discharge Orders     None         Claude Manges, PA-C 06/20/21 1303    Mancel Bale, MD 06/20/21 1513

## 2021-06-20 NOTE — Anesthesia Procedure Notes (Addendum)
Procedure Name: Intubation Date/Time: 06/20/2021 1:52 PM Performed by: Wynonia Sours, CRNA Pre-anesthesia Checklist: Patient identified, Emergency Drugs available, Suction available, Patient being monitored and Timeout performed Patient Re-evaluated:Patient Re-evaluated prior to induction Oxygen Delivery Method: Circle system utilized Preoxygenation: Pre-oxygenation with 100% oxygen Induction Type: IV induction Ventilation: Mask ventilation without difficulty and Mask ventilation throughout procedure Laryngoscope Size: Glidescope and 4 Grade View: Grade II Tube type: Oral Tube size: 7.5 mm Airway Equipment and Method: Stylet Placement Confirmation: ETT inserted through vocal cords under direct vision, positive ETCO2, CO2 detector and breath sounds checked- equal and bilateral Secured at: 23 cm Tube secured with: Tape Dental Injury: Teeth and Oropharynx as per pre-operative assessment  Difficulty Due To: Difficult Airway- due to anterior larynx Comments: DL x1 by K.Taelynn Mcelhannon,CRNA-epiglottis view only no attempt made, Glidescope obtained and Grade 2 view and intubation successful by Dr Armond Hang.

## 2021-06-21 ENCOUNTER — Encounter (HOSPITAL_COMMUNITY): Payer: Self-pay | Admitting: Surgery

## 2021-06-25 LAB — SURGICAL PATHOLOGY

## 2021-11-12 ENCOUNTER — Encounter (INDEPENDENT_AMBULATORY_CARE_PROVIDER_SITE_OTHER): Payer: Self-pay | Admitting: *Deleted

## 2021-12-18 ENCOUNTER — Ambulatory Visit (INDEPENDENT_AMBULATORY_CARE_PROVIDER_SITE_OTHER): Payer: BC Managed Care – PPO | Admitting: Physician Assistant

## 2021-12-18 VITALS — BP 146/78 | HR 72 | Ht 70.0 in | Wt 198.0 lb

## 2021-12-18 DIAGNOSIS — I480 Paroxysmal atrial fibrillation: Secondary | ICD-10-CM

## 2021-12-18 DIAGNOSIS — I10 Essential (primary) hypertension: Secondary | ICD-10-CM | POA: Diagnosis not present

## 2021-12-18 NOTE — Progress Notes (Unsigned)
Cardiology Office Note:    Date:  12/19/2021   ID:  Jared [name], DOB 1960/11/11, MRN [id number]  PCP:  Benita Stabile, MD   [facility] Providers Cardiologist:  Nicki Guadalajara, MD     Referring MD: Benita Stabile, MD   Chief Complaint  Patient presents with   Follow-up    1 year.    History of Present Illness:    Jared [name] is a 61 y.o. male with a hx of EtOH abuse, PAF, depression and hypertension.  Patient was started on Abilify and trazodone in 2017 for depression and possible suicidal ideation.  He developed atrial fibrillation which responded to Cardizem drip.  He was started initially on Xarelto, this was later changed to aspirin due to low CHA2DS2-Vasc score of 1.  Echocardiogram showed EF of 55 to 60%, grade 2 DD, mild MR, evidence of increased septal wall thickness consistent with lipomatous hypertrophy.  Patient was last seen by Dr. Tresa Endo in August 2022 at which time he was doing well.  More recently, patient presented to the hospital in February with acute appendicitis and underwent laparoscopic appendectomy.  Patient presents today for 1 year follow-up.  He has recovered quite well since his surgery earlier this year.  He denies any abdominal discomfort.  His blood pressure is elevated today, I asked him to recheck his blood pressure at home in the next few weeks.  If systolic blood pressure remain greater than 140s, he has been instructed to start taking hydrochlorothiazide on a daily basis.  Although hydrochlorothiazide is currently listed as taking daily, in fact he is only taking hydrochlorothiazide every other day.  He has recent blood work shows well-controlled cholesterol, borderline elevated triglyceride.  Stable renal function the electrolyte.  Past Medical History:  Diagnosis Date   Anxiety    Hypertension    PVC (premature ventricular contraction)     Past Surgical History:  Procedure Laterality Date   LAPAROSCOPIC APPENDECTOMY N/A 06/20/2021    Procedure: APPENDECTOMY LAPAROSCOPIC;  Surgeon: Berna Bue, MD;  Location: [facility];  Service: General;  Laterality: N/A;    Current Medications: Current Meds  Medication Sig   Ascorbic Acid (VITAMIN C) 1000 MG tablet Take 1,000 mg by mouth daily.   aspirin 81 MG tablet Take 81 mg by mouth daily.   b complex vitamins tablet Take 1 tablet by mouth daily.   Biotin 1000 MCG tablet Take 1,000 mcg by mouth 3 (three) times daily.   diltiazem (CARDIZEM CD) 240 MG 24 hr capsule TAKE ONE CAPSULE EACH DAY (Patient taking differently: Take 240 mg by mouth daily.)   diphenhydrAMINE (BENADRYL) 25 MG tablet Take 25 mg by mouth every 6 (six) hours as needed for itching, allergies or sleep.    gabapentin (NEURONTIN) 300 MG capsule Take 300 mg by mouth 4 (four) times daily.   Magnesium 250 MG TABS Take 250 mg by mouth daily.   Multiple Vitamins-Minerals (MENS 50+ MULTI VITAMIN/MIN) TABS Take by mouth daily.   olmesartan (BENICAR) 40 MG tablet TAKE ONE TABLET DAILY (Patient taking differently: Take 40 mg by mouth daily.)   Potassium 99 MG TABS Take 99 mg by mouth daily.   VITAMIN D PO Take 5,000 Units by mouth daily.   Zinc 50 MG TABS Take 50 mg by mouth daily.     Allergies:   Patient has no known allergies.   Social History   Socioeconomic History   Marital status: Married    Spouse name: Not  on file   Number of children: Not on file   Years of education: Not on file   Highest education level: Not on file  Occupational History   Not on file  Tobacco Use   Smoking status: Former   Smokeless tobacco: Former    Quit date: 01/28/1996  Vaping Use   Vaping Use: Never used  Substance and Sexual Activity   Alcohol use: No    Alcohol/week: 18.0 standard drinks of alcohol    Types: 4 Glasses of wine, 14 Standard drinks or equivalent per week    Comment: Quit in 07/2015.   Drug use: No   Sexual activity: Yes    Birth control/protection: None  Other Topics Concern   Not on file  Social  History Narrative   Not on file   Social Determinants of Health   Financial Resource Strain: Not on file  Food Insecurity: Not on file  Transportation Needs: Not on file  Physical Activity: Not on file  Stress: Not on file  Social Connections: Not on file     Family History: The patient's family history includes Alcohol abuse in his paternal uncle; Heart attack in his father; Heart disease in his father and mother; Hypertension in his mother; Stroke in his mother.  ROS:   Please see the history of present illness.     All other systems reviewed and are negative.  EKGs/Labs/Other Studies Reviewed:    The following studies were reviewed today:  Echo 01/29/2016 LV EF: 55% -   60%   -------------------------------------------------------------------  Indications:      35218.   -------------------------------------------------------------------  History:   Risk factors:  Hypertension. Dyslipidemia.   -------------------------------------------------------------------  Study Conclusions   - Left ventricle: The cavity size was mildly dilated. Systolic    function was normal. The estimated ejection fraction was in the    range of 55% to 60%. Wall motion was normal; there were no    regional wall motion abnormalities. Features are consistent with    a pseudonormal left ventricular filling pattern, with concomitant    abnormal relaxation and increased filling pressure (grade 2    diastolic dysfunction).  - Aortic valve: There was mild regurgitation.  - Atrial septum: There was increased thickness of the septum,    consistent with lipomatous hypertrophy.   EKG:  EKG is ordered today.  The ekg ordered today demonstrates normal sinus rhythm, no significant ST-T wave changes, single PVC.  Recent Labs: 06/20/2021: ALT 36; BUN 15; Creatinine, Ser 0.88; Hemoglobin 13.6; Platelets 197; Potassium 3.9; Sodium 139  Recent Lipid Panel    Component Value Date/Time   CHOL 154 01/28/2016  0617   TRIG 198 (H) 01/28/2016 0617   HDL 38 (L) 01/28/2016 0617   CHOLHDL 4.1 01/28/2016 0617   VLDL 40 01/28/2016 0617   LDLCALC 76 01/28/2016 0617   LDLDIRECT 98.6 04/23/2010 0916     Risk Assessment/Calculations:    CHA2DS2-VASc Score = 1   This indicates a 0.6% annual risk of stroke. The patient's score is based upon: CHF History: 0 HTN History: 1 Diabetes History: 0 Stroke History: 0 Vascular Disease History: 0 Age Score: 0 Gender Score: 0           Physical Exam:    VS:  BP (!) 146/78 (BP Location: Right Arm, Patient Position: Sitting, Cuff Size: Normal)   Pulse 72   Ht 5\' 10"  (1.778 m)   Wt 198 lb (89.8 kg)   BMI 28.41 kg/m  Wt Readings from Last 3 Encounters:  12/18/21 198 lb (89.8 kg)  06/20/21 190 lb (86.2 kg)  12/17/20 197 lb 9.6 oz (89.6 kg)     GEN:  Well nourished, well developed in no acute distress HEENT: Normal NECK: No JVD; No carotid bruits LYMPHATICS: No lymphadenopathy CARDIAC: RRR, no murmurs, rubs, gallops RESPIRATORY:  Clear to auscultation without rales, wheezing or rhonchi  ABDOMEN: Soft, non-tender, non-distended MUSCULOSKELETAL:  No edema; No deformity  SKIN: Warm and dry NEUROLOGIC:  Alert and oriented x 3 PSYCHIATRIC:  Normal affect   ASSESSMENT:    1. PAF (paroxysmal atrial fibrillation) (HCC)   2. Essential hypertension    PLAN:    In order of problems listed above:  PAF: No significant recurrence of atrial fibrillation.  Currently on aspirin given low CHA2DS2-VASc score.  Hypertension: Blood pressure stable         Medication Adjustments/Labs and Tests Ordered: Current medicines are reviewed at length with the patient today.  Concerns regarding medicines are outlined above.  Orders Placed This Encounter  Procedures   EKG 12-Lead   No orders of the defined types were placed in this encounter.   Patient Instructions  Medication Instructions:  Your physician recommends that you continue on your  current medications as directed. Please refer to the Current Medication list given to you today.  *If you need a refill on your cardiac medications before your next appointment, please call your pharmacy*  Lab Work: NONE ordered at this time of appointment   If you have labs (blood work) drawn today and your tests are completely normal, you will receive your results only by: MyChart Message (if you have MyChart) OR A paper copy in the mail If you have any lab test that is abnormal or we need to change your treatment, we will call you to review the results.  Testing/Procedures: NONE ordered at this time of appointment   Follow-Up: At [facility], you and your health needs are our priority.  As part of our continuing mission to provide you with exceptional heart care, we have created designated Provider Care Teams.  These Care Teams include your primary Cardiologist (physician) and Advanced Practice Providers (APPs -  Physician Assistants and Nurse Practitioners) who all work together to provide you with the care you need, when you need it.  Your next appointment:   1 year(s)  The format for your next appointment:   In Person  Provider:   Nicki Guadalajara, MD    Other Instructions   Important Information About Sugar         Ramond Dial, [place]  12/19/2021 4:08 PM    [facility]

## 2021-12-18 NOTE — Patient Instructions (Signed)
Medication Instructions:  ?Your physician recommends that you continue on your current medications as directed. Please refer to the Current Medication list given to you today. ? ?*If you need a refill on your cardiac medications before your next appointment, please call your pharmacy* ? ?Lab Work: ?NONE ordered at this time of appointment  ? ?If you have labs (blood work) drawn today and your tests are completely normal, you will receive your results only by: ?MyChart Message (if you have MyChart) OR ?A paper copy in the mail ?If you have any lab test that is abnormal or we need to change your treatment, we will call you to review the results. ? ?Testing/Procedures: ?NONE ordered at this time of appointment  ? ?Follow-Up: ?At [facility], you and your health needs are our priority.  As part of our continuing mission to provide you with exceptional heart care, we have created designated Provider Care Teams.  These Care Teams include your primary Cardiologist (physician) and Advanced Practice Providers (APPs -  Physician Assistants and Nurse Practitioners) who all work together to provide you with the care you need, when you need it. ? ?Your next appointment:   ?1 year(s) ? ?The format for your next appointment:   ?In Person ? ?Provider:   ?Thomas Kelly, MD   ? ? ?Other Instructions ? ? ?Important Information About Sugar ? ? ? ? ? ? ?

## 2022-01-22 ENCOUNTER — Ambulatory Visit: Payer: Self-pay | Admitting: Surgery

## 2022-01-22 NOTE — H&P (Signed)
Jared [name] [id number]   Referring Provider:  Self   Subjective   Chief Complaint: New Consultation (Hernia )     History of Present Illness: 61 year old male following up regarding concern for inguinal hernias.  I last saw him in May.  He is known to me following emergent laparoscopic appendectomy at the end of February for gangrenous appendicitis without perforation.  He has noted ongoing swelling in the suprapubic region in the area of prior hernia repairs which were done when he was a child.  This initially presented after a 6-week episode of coughing related to bronchitis.  At our last visit he had no pain, and had not noticed any significant protrusion or reducible structure in the groin, no GI or obstructive symptoms although did note that occasionally the right scrotum and testicle felt tight and notes that he has had a hydrocele aspirated in the past many years ago.  On exam noted increased adipose tissue in bilateral suprapubic pubic region but on palpation no obvious hernia with standing nor Valsalva; and on review of his CT that he had when he presented with appendicitis I did see what appears to be a small direct right hernia containing fat but no hernia on the left.  At the time given nothing appreciable on exam and minimal symptoms surgery was not recommended.  He continues to notice intermittent pain and tightness more on the right than the left, and also notes an umbilical hernia now.  He is interested in proceeding with repair.     Review of Systems: A complete review of systems was obtained from the patient.  I have reviewed this information and discussed as appropriate with the patient.  See HPI as well for other ROS.   Medical History: Past Medical History:  Diagnosis Date   Hypertension     There is no problem list on file for this patient.   Past Surgical History:  Procedure Laterality Date   APPENDECTOMY       No Known Allergies  Current Outpatient  Medications on File Prior to Visit  Medication Sig Dispense Refill   aspirin 81 MG EC tablet Take 81 mg by mouth once daily     cholecalciferol, vitamin D3, (CHOLECALCIFEROL, VIT D3,,BULK,) 100,000 unit/gram Powd Take by mouth     dilTIAZem (TIAZAC) 240 MG ER capsule TAKE ONE CAPSULE EACH DAY     hydroCHLOROthiazide (HYDRODIURIL) 25 MG tablet Take by mouth     magnesium 250 mg Tab Take by mouth     multivitamin with minerals tablet Take by mouth once daily     olmesartan (BENICAR) 40 MG tablet      potassium gluconate 2.5 mEq Tab Take by mouth     No current facility-administered medications on file prior to visit.    Family History  Problem Relation Age of Onset   Skin cancer Mother    High blood pressure (Hypertension) Mother    Colon cancer Father    Coronary Artery Disease (Blocked arteries around heart) Father      Social History   Tobacco Use  Smoking Status Former   Types: Cigarettes  Smokeless Tobacco Never     Social History   Socioeconomic History   Marital status: Married  Tobacco Use   Smoking status: Former    Types: Cigarettes   Smokeless tobacco: Never  Substance and Sexual Activity   Alcohol use: Never   Drug use: Never    Objective:    Vitals:   01/22/22 1005  BP: 130/80  Pulse: 73  Temp: 36.7 C (98 F)  SpO2: 96%  Weight: 89.8 kg (198 lb)  Height: 177.8 cm (5\' 10" )    Body mass index is 28.41 kg/m.  Alert and well-appearing Unlabored respirations Abdomen is soft, nontender, nondistended.  Partially reducible umbilical hernia containing fat, fascial defect not palpable.  Laxity versus vaguely palpable right greater than left inguinal hernias.  Assessment and Plan:  Diagnoses and all orders for this visit:  Inguinal hernia without obstruction or gangrene, recurrence not specified, unspecified laterality  Umbilical hernia without obstruction and without gangrene    We discussed proceeding with minimally invasive repair, and I went  over the relevant anatomy and surgical technique with him as well as risks of bleeding, infection, pain, scarring, injury to intra-abdominal or retroperitoneal structures, hernia recurrence, chronic pain, urinary retention, etc.  We will plan to repair both sides assuming laparoscopic/robotic examination confirms bilateral inguinal hernias, and we will plan to repair the umbilical hernia in an open fashion.  Questions welcomed and answered, he wishes to proceed towards the end of the year.  Timiko Offutt Raquel James, MD

## 2022-02-13 ENCOUNTER — Emergency Department (HOSPITAL_COMMUNITY): Payer: BC Managed Care – PPO

## 2022-02-13 ENCOUNTER — Other Ambulatory Visit (HOSPITAL_COMMUNITY): Payer: BC Managed Care – PPO

## 2022-02-13 ENCOUNTER — Inpatient Hospital Stay (HOSPITAL_COMMUNITY)
Admission: EM | Admit: 2022-02-13 | Discharge: 2022-02-19 | DRG: 483 | Disposition: A | Discharge status: Home Health | Payer: BC Managed Care – PPO | Attending: Family Medicine | Admitting: Family Medicine

## 2022-02-13 DIAGNOSIS — S82402A Unspecified fracture of shaft of left fibula, initial encounter for closed fracture: Secondary | ICD-10-CM

## 2022-02-13 DIAGNOSIS — R001 Bradycardia, unspecified: Secondary | ICD-10-CM | POA: Diagnosis present

## 2022-02-13 DIAGNOSIS — S51012A Laceration without foreign body of left elbow, initial encounter: Secondary | ICD-10-CM | POA: Diagnosis present

## 2022-02-13 DIAGNOSIS — Y92410 Unspecified street and highway as the place of occurrence of the external cause: Secondary | ICD-10-CM

## 2022-02-13 DIAGNOSIS — K219 Gastro-esophageal reflux disease without esophagitis: Secondary | ICD-10-CM | POA: Diagnosis present

## 2022-02-13 DIAGNOSIS — I4821 Permanent atrial fibrillation: Secondary | ICD-10-CM | POA: Diagnosis present

## 2022-02-13 DIAGNOSIS — I959 Hypotension, unspecified: Secondary | ICD-10-CM | POA: Diagnosis present

## 2022-02-13 DIAGNOSIS — Z7982 Long term (current) use of aspirin: Secondary | ICD-10-CM | POA: Diagnosis not present

## 2022-02-13 DIAGNOSIS — I480 Paroxysmal atrial fibrillation: Secondary | ICD-10-CM | POA: Diagnosis present

## 2022-02-13 DIAGNOSIS — S46012A Strain of muscle(s) and tendon(s) of the rotator cuff of left shoulder, initial encounter: Secondary | ICD-10-CM | POA: Diagnosis present

## 2022-02-13 DIAGNOSIS — I951 Orthostatic hypotension: Secondary | ICD-10-CM

## 2022-02-13 DIAGNOSIS — I110 Hypertensive heart disease with heart failure: Secondary | ICD-10-CM | POA: Diagnosis present

## 2022-02-13 DIAGNOSIS — Z87891 Personal history of nicotine dependence: Secondary | ICD-10-CM | POA: Diagnosis not present

## 2022-02-13 DIAGNOSIS — Z79899 Other long term (current) drug therapy: Secondary | ICD-10-CM

## 2022-02-13 DIAGNOSIS — Z23 Encounter for immunization: Secondary | ICD-10-CM

## 2022-02-13 DIAGNOSIS — M65812 Other synovitis and tenosynovitis, left shoulder: Secondary | ICD-10-CM | POA: Diagnosis present

## 2022-02-13 DIAGNOSIS — Z8249 Family history of ischemic heart disease and other diseases of the circulatory system: Secondary | ICD-10-CM | POA: Diagnosis not present

## 2022-02-13 DIAGNOSIS — S82209A Unspecified fracture of shaft of unspecified tibia, initial encounter for closed fracture: Secondary | ICD-10-CM

## 2022-02-13 DIAGNOSIS — S82302B Unspecified fracture of lower end of left tibia, initial encounter for open fracture type I or II: Secondary | ICD-10-CM | POA: Diagnosis present

## 2022-02-13 DIAGNOSIS — S82832B Other fracture of upper and lower end of left fibula, initial encounter for open fracture type I or II: Secondary | ICD-10-CM | POA: Diagnosis present

## 2022-02-13 DIAGNOSIS — I493 Ventricular premature depolarization: Secondary | ICD-10-CM | POA: Diagnosis present

## 2022-02-13 DIAGNOSIS — E876 Hypokalemia: Secondary | ICD-10-CM | POA: Diagnosis present

## 2022-02-13 DIAGNOSIS — S43005A Unspecified dislocation of left shoulder joint, initial encounter: Secondary | ICD-10-CM | POA: Diagnosis present

## 2022-02-13 DIAGNOSIS — I5032 Chronic diastolic (congestive) heart failure: Secondary | ICD-10-CM | POA: Diagnosis present

## 2022-02-13 DIAGNOSIS — D649 Anemia, unspecified: Secondary | ICD-10-CM | POA: Diagnosis not present

## 2022-02-13 DIAGNOSIS — E872 Acidosis, unspecified: Secondary | ICD-10-CM | POA: Diagnosis present

## 2022-02-13 DIAGNOSIS — D62 Acute posthemorrhagic anemia: Secondary | ICD-10-CM | POA: Diagnosis not present

## 2022-02-13 DIAGNOSIS — R55 Syncope and collapse: Secondary | ICD-10-CM | POA: Diagnosis not present

## 2022-02-13 LAB — SAMPLE TO BLOOD BANK

## 2022-02-13 LAB — I-STAT CHEM 8, ED
BUN: 12 mg/dL (ref 8–23)
Calcium, Ion: 1.17 mmol/L (ref 1.15–1.40)
Chloride: 101 mmol/L (ref 98–111)
Creatinine, Ser: 1.1 mg/dL (ref 0.61–1.24)
Glucose, Bld: 133 mg/dL — ABNORMAL HIGH (ref 70–99)
HCT: 36 % — ABNORMAL LOW (ref 39.0–52.0)
Hemoglobin: 12.2 g/dL — ABNORMAL LOW (ref 13.0–17.0)
Potassium: 3.4 mmol/L — ABNORMAL LOW (ref 3.5–5.1)
Sodium: 139 mmol/L (ref 135–145)
TCO2: 24 mmol/L (ref 22–32)

## 2022-02-13 LAB — CBC
HCT: 32.4 % — ABNORMAL LOW (ref 39.0–52.0)
Hemoglobin: 11 g/dL — ABNORMAL LOW (ref 13.0–17.0)
MCH: 32.5 pg (ref 26.0–34.0)
MCHC: 34 g/dL (ref 30.0–36.0)
MCV: 95.9 fL (ref 80.0–100.0)
Platelets: 174 10*3/uL (ref 150–400)
RBC: 3.38 MIL/uL — ABNORMAL LOW (ref 4.22–5.81)
RDW: 12.7 % (ref 11.5–15.5)
WBC: 9.1 10*3/uL (ref 4.0–10.5)
nRBC: 0 % (ref 0.0–0.2)

## 2022-02-13 LAB — COMPREHENSIVE METABOLIC PANEL
ALT: 38 U/L (ref 0–44)
AST: 41 U/L (ref 15–41)
Albumin: 3.9 g/dL (ref 3.5–5.0)
Alkaline Phosphatase: 75 U/L (ref 38–126)
Anion gap: 16 — ABNORMAL HIGH (ref 5–15)
BUN: 12 mg/dL (ref 8–23)
CO2: 23 mmol/L (ref 22–32)
Calcium: 9.6 mg/dL (ref 8.9–10.3)
Chloride: 100 mmol/L (ref 98–111)
Creatinine, Ser: 1.17 mg/dL (ref 0.61–1.24)
GFR, Estimated: >60 mL/min (ref >60)
Glucose, Bld: 139 mg/dL — ABNORMAL HIGH (ref 70–99)
Potassium: 3.5 mmol/L (ref 3.5–5.1)
Sodium: 139 mmol/L (ref 135–145)
Total Bilirubin: 0.7 mg/dL (ref 0.3–1.2)
Total Protein: 6.1 g/dL — ABNORMAL LOW (ref 6.5–8.1)

## 2022-02-13 LAB — MAGNESIUM: Magnesium: 1.8 mg/dL (ref 1.7–2.4)

## 2022-02-13 LAB — LACTIC ACID, PLASMA
Lactic Acid, Venous: 2.3 mmol/L (ref 0.5–1.9)
Lactic Acid, Venous: 4.2 mmol/L (ref 0.5–1.9)

## 2022-02-13 LAB — ETHANOL: Alcohol, Ethyl (B): <10 mg/dL (ref <10)

## 2022-02-13 LAB — PROTIME-INR
INR: 1 (ref 0.8–1.2)
Prothrombin Time: 13.5 seconds (ref 11.4–15.2)

## 2022-02-13 MED ORDER — LIDOCAINE-EPINEPHRINE (PF) 2 %-1:200000 IJ SOLN
INTRAMUSCULAR | Status: AC
  Administered 2022-02-13: 20 mL
  Filled 2022-02-13: qty 20

## 2022-02-13 MED ORDER — CEFAZOLIN SODIUM-DEXTROSE 2-4 GM/100ML-% IV SOLN
2.0000 g | Freq: Once | INTRAVENOUS | Status: AC
  Administered 2022-02-13: 2 g via INTRAVENOUS

## 2022-02-13 MED ORDER — IOHEXOL 350 MG/ML SOLN
75.0000 mL | Freq: Once | INTRAVENOUS | Status: AC | PRN
  Administered 2022-02-13: 75 mL via INTRAVENOUS

## 2022-02-13 MED ORDER — LACTATED RINGERS IV BOLUS
1000.0000 mL | Freq: Once | INTRAVENOUS | Status: AC
  Administered 2022-02-13: 1000 mL via INTRAVENOUS

## 2022-02-13 MED ORDER — FENTANYL CITRATE PF 50 MCG/ML IJ SOSY
50.0000 ug | PREFILLED_SYRINGE | Freq: Once | INTRAMUSCULAR | Status: AC
  Administered 2022-02-13: 50 ug via INTRAVENOUS
  Filled 2022-02-13: qty 1

## 2022-02-13 MED ORDER — TETANUS-DIPHTH-ACELL PERTUSSIS 5-2.5-18.5 LF-MCG/0.5 IM SUSY
0.5000 mL | PREFILLED_SYRINGE | Freq: Once | INTRAMUSCULAR | Status: AC
  Administered 2022-02-13: 0.5 mL via INTRAMUSCULAR

## 2022-02-13 MED ORDER — POTASSIUM CHLORIDE 20 MEQ PO PACK
40.0000 meq | PACK | Freq: Two times a day (BID) | ORAL | Status: DC
  Administered 2022-02-13 – 2022-02-17 (×8): 40 meq via ORAL
  Filled 2022-02-13 (×9): qty 2

## 2022-02-13 MED ORDER — SODIUM CHLORIDE 0.9 % IV BOLUS
1000.0000 mL | Freq: Once | INTRAVENOUS | Status: AC
  Administered 2022-02-13: 1000 mL via INTRAVENOUS

## 2022-02-13 MED ORDER — LIDOCAINE-EPINEPHRINE (PF) 2 %-1:200000 IJ SOLN: 20.0000 mL | Freq: Once | INTRAMUSCULAR | Status: AC

## 2022-02-13 NOTE — ED Notes (Signed)
Pt states MD removed his c-collar a few minutes ago. Pt lifted his head up and felt sick again. Reports feeling dizzy, HR decreased to 30s bpm. BP dropped to 80/54. 2nd liter of IVF given at this time. Dr.Scheving at bedside. Cardiac monitoring in place. Pt appears pale but remains alert and oriented x 4. Will continue to monitor.
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
## 2022-02-13 NOTE — Progress Notes (Signed)
Orthopedic Tech Progress Note Patient Details:  Jorje Vanatta 11/16/1960 [id number]  A posterior short leg was applied with stirrups extending to mid-thigh in the best obtainable fashion as the pt could not tolerate the amount of movement necessary to apply the full posterior slab for a long leg splint.  The extended stirrups did help prevent external rotation of the lower leg which we reinforced on the lateral side with folded blankets for added support. Movement and sensation of his toes remained intact.  Ortho Devices Type of Ortho Device: Stirrup splint, Post (short leg) splint Ortho Device/Splint Location: LLE Ortho Device/Splint Interventions: Ordered, Application, Adjustment   Post Interventions Patient Tolerated: Fair, Poor Instructions Provided: Care of device  Synthia Fairbank Jeri Modena 02/13/2022, 8:41 PM
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
## 2022-02-13 NOTE — ED Notes (Signed)
Ortho tech is coming to place long leg splint to left leg. Pending left leg CT.
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
## 2022-02-13 NOTE — ED Notes (Signed)
Left leg cleaned and dry, nonadherent dressing applied.

## 2022-02-13 NOTE — ED Notes (Signed)
Splinting complete. CBC and etoh drawn and sent to lab. Pt linens changed, repositioned, new dressing placed to lac on the right lateral elbow.
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
## 2022-02-14 ENCOUNTER — Encounter (HOSPITAL_COMMUNITY)
Admission: EM | Disposition: A | Discharge status: Home Health | Payer: Self-pay | Source: Home / Self Care | Attending: Internal Medicine

## 2022-02-14 ENCOUNTER — Inpatient Hospital Stay (HOSPITAL_COMMUNITY): Payer: BC Managed Care – PPO

## 2022-02-14 ENCOUNTER — Encounter (HOSPITAL_COMMUNITY): Payer: Self-pay | Admitting: Family Medicine

## 2022-02-14 ENCOUNTER — Inpatient Hospital Stay (HOSPITAL_COMMUNITY): Payer: BC Managed Care – PPO | Admitting: Anesthesiology

## 2022-02-14 ENCOUNTER — Other Ambulatory Visit: Payer: Self-pay

## 2022-02-14 DIAGNOSIS — E876 Hypokalemia: Secondary | ICD-10-CM

## 2022-02-14 DIAGNOSIS — R55 Syncope and collapse: Secondary | ICD-10-CM | POA: Diagnosis not present

## 2022-02-14 DIAGNOSIS — S82302B Unspecified fracture of lower end of left tibia, initial encounter for open fracture type I or II: Secondary | ICD-10-CM | POA: Diagnosis not present

## 2022-02-14 DIAGNOSIS — R001 Bradycardia, unspecified: Secondary | ICD-10-CM | POA: Diagnosis not present

## 2022-02-14 DIAGNOSIS — S51012A Laceration without foreign body of left elbow, initial encounter: Secondary | ICD-10-CM

## 2022-02-14 DIAGNOSIS — D649 Anemia, unspecified: Secondary | ICD-10-CM

## 2022-02-14 HISTORY — PX: TIBIA IM NAIL INSERTION: SHX2516

## 2022-02-14 LAB — ECHOCARDIOGRAM COMPLETE
AR max vel: 3.37 cm2
AV Area VTI: 3.04 cm2
AV Area mean vel: 3.33 cm2
AV Mean grad: 3 mmHg
AV Peak grad: 5.9 mmHg
Ao pk vel: 1.21 m/s
Height: 70 in
S' Lateral: 2.9 cm
Weight: 3241.64 oz

## 2022-02-14 LAB — CK: Total CK: 960 U/L — ABNORMAL HIGH (ref 49–397)

## 2022-02-14 LAB — LACTIC ACID, PLASMA: Lactic Acid, Venous: 1.3 mmol/L (ref 0.5–1.9)

## 2022-02-14 LAB — SURGICAL PCR SCREEN
MRSA, PCR: NEGATIVE
Staphylococcus aureus: POSITIVE — AB

## 2022-02-14 LAB — CBG MONITORING, ED: Glucose-Capillary: 109 mg/dL — ABNORMAL HIGH (ref 70–99)

## 2022-02-14 SURGERY — INSERTION, INTRAMEDULLARY ROD, TIBIA
Anesthesia: General | Laterality: Left

## 2022-02-14 MED ORDER — ORAL CARE MOUTH RINSE: 15.0000 mL | Freq: Once | OROMUCOSAL | Status: AC

## 2022-02-14 MED ORDER — ONDANSETRON HCL 4 MG/2ML IJ SOLN
INTRAMUSCULAR | Status: DC | PRN
  Administered 2022-02-14: 4 mg via INTRAVENOUS

## 2022-02-14 MED ORDER — SODIUM CHLORIDE 0.9 % IR SOLN
Status: DC | PRN
  Administered 2022-02-14: 3000 mL

## 2022-02-14 MED ORDER — IRBESARTAN 300 MG PO TABS
300.0000 mg | ORAL_TABLET | Freq: Every day | ORAL | Status: DC
  Administered 2022-02-14 – 2022-02-19 (×6): 300 mg via ORAL
  Filled 2022-02-14 (×6): qty 1

## 2022-02-14 MED ORDER — LIDOCAINE 2% (20 MG/ML) 5 ML SYRINGE
INTRAMUSCULAR | Status: DC | PRN
  Administered 2022-02-14: 60 mg via INTRAVENOUS

## 2022-02-14 MED ORDER — HYDROCODONE-ACETAMINOPHEN 5-325 MG PO TABS
1.0000 | ORAL_TABLET | ORAL | Status: DC | PRN
  Administered 2022-02-14 – 2022-02-17 (×7): 1 via ORAL
  Filled 2022-02-14 (×7): qty 1

## 2022-02-14 MED ORDER — CHLORHEXIDINE GLUCONATE CLOTH 2 % EX PADS
6.0000 | MEDICATED_PAD | Freq: Every day | CUTANEOUS | Status: DC
  Administered 2022-02-15 – 2022-02-17 (×3): 6 via TOPICAL

## 2022-02-14 MED ORDER — 0.9 % SODIUM CHLORIDE (POUR BTL) OPTIME
TOPICAL | Status: DC | PRN
  Administered 2022-02-14: 1000 mL

## 2022-02-14 MED ORDER — KETOROLAC TROMETHAMINE 15 MG/ML IJ SOLN
INTRAMUSCULAR | Status: AC
  Filled 2022-02-14: qty 1

## 2022-02-14 MED ORDER — DEXAMETHASONE SODIUM PHOSPHATE 10 MG/ML IJ SOLN
INTRAMUSCULAR | Status: AC
  Filled 2022-02-14: qty 1

## 2022-02-14 MED ORDER — FENTANYL CITRATE (PF) 250 MCG/5ML IJ SOLN
INTRAMUSCULAR | Status: AC
  Filled 2022-02-14: qty 5

## 2022-02-14 MED ORDER — DILTIAZEM HCL ER COATED BEADS 120 MG PO CP24
240.0000 mg | ORAL_CAPSULE | Freq: Every day | ORAL | Status: DC
  Administered 2022-02-14 – 2022-02-19 (×6): 240 mg via ORAL
  Filled 2022-02-14 (×6): qty 2

## 2022-02-14 MED ORDER — CHLORHEXIDINE GLUCONATE 0.12 % MT SOLN: 15.0000 mL | Freq: Once | OROMUCOSAL | Status: AC

## 2022-02-14 MED ORDER — MIDAZOLAM HCL 2 MG/2ML IJ SOLN
INTRAMUSCULAR | Status: AC
  Filled 2022-02-14: qty 2

## 2022-02-14 MED ORDER — CEFAZOLIN SODIUM-DEXTROSE 2-4 GM/100ML-% IV SOLN
2.0000 g | Freq: Three times a day (TID) | INTRAVENOUS | Status: AC
  Administered 2022-02-14 – 2022-02-15 (×3): 2 g via INTRAVENOUS
  Filled 2022-02-14 (×3): qty 100

## 2022-02-14 MED ORDER — CEFAZOLIN SODIUM-DEXTROSE 2-4 GM/100ML-% IV SOLN
2.0000 g | Freq: Three times a day (TID) | INTRAVENOUS | Status: DC
  Administered 2022-02-14 (×2): 2 g via INTRAVENOUS
  Filled 2022-02-14 (×2): qty 100

## 2022-02-14 MED ORDER — ACETAMINOPHEN 500 MG PO TABS
ORAL_TABLET | ORAL | Status: AC
  Filled 2022-02-14: qty 2

## 2022-02-14 MED ORDER — ONDANSETRON HCL 4 MG/2ML IJ SOLN
INTRAMUSCULAR | Status: AC
  Filled 2022-02-14: qty 2

## 2022-02-14 MED ORDER — VANCOMYCIN HCL 1000 MG IV SOLR
INTRAVENOUS | Status: AC
  Filled 2022-02-14: qty 20

## 2022-02-14 MED ORDER — MIDAZOLAM HCL 2 MG/2ML IJ SOLN
INTRAMUSCULAR | Status: DC | PRN
  Administered 2022-02-14: 2 mg via INTRAVENOUS

## 2022-02-14 MED ORDER — FENTANYL CITRATE (PF) 100 MCG/2ML IJ SOLN
INTRAMUSCULAR | Status: DC | PRN
  Administered 2022-02-14 (×2): 100 ug via INTRAVENOUS
  Administered 2022-02-14 (×3): 50 ug via INTRAVENOUS

## 2022-02-14 MED ORDER — OXYCODONE HCL 5 MG/5ML PO SOLN: 5.0000 mg | Freq: Once | ORAL | Status: DC | PRN

## 2022-02-14 MED ORDER — LACTATED RINGERS IV SOLN: INTRAVENOUS | Status: AC

## 2022-02-14 MED ORDER — SUGAMMADEX SODIUM 200 MG/2ML IV SOLN
INTRAVENOUS | Status: DC | PRN
  Administered 2022-02-14: 200 mg via INTRAVENOUS

## 2022-02-14 MED ORDER — PROPOFOL 10 MG/ML IV BOLUS
INTRAVENOUS | Status: AC
  Filled 2022-02-14: qty 20

## 2022-02-14 MED ORDER — KETOROLAC TROMETHAMINE 15 MG/ML IJ SOLN
15.0000 mg | Freq: Four times a day (QID) | INTRAMUSCULAR | Status: AC
  Administered 2022-02-14 – 2022-02-15 (×5): 15 mg via INTRAVENOUS
  Filled 2022-02-14 (×5): qty 1

## 2022-02-14 MED ORDER — ACETAMINOPHEN 160 MG/5ML PO SOLN: 1000.0000 mg | Freq: Once | ORAL | Status: DC | PRN

## 2022-02-14 MED ORDER — LACTATED RINGERS IV SOLN: INTRAVENOUS | Status: DC

## 2022-02-14 MED ORDER — MORPHINE SULFATE (PF) 2 MG/ML IV SOLN: 2.0000 mg | INTRAVENOUS | Status: DC | PRN

## 2022-02-14 MED ORDER — ROCURONIUM BROMIDE 10 MG/ML (PF) SYRINGE
PREFILLED_SYRINGE | INTRAVENOUS | Status: AC
  Filled 2022-02-14: qty 10

## 2022-02-14 MED ORDER — CHLORHEXIDINE GLUCONATE 0.12 % MT SOLN
OROMUCOSAL | Status: AC
  Administered 2022-02-14: 15 mL via OROMUCOSAL
  Filled 2022-02-14: qty 15

## 2022-02-14 MED ORDER — ACETAMINOPHEN 10 MG/ML IV SOLN: 1000.0000 mg | Freq: Once | INTRAVENOUS | Status: DC | PRN

## 2022-02-14 MED ORDER — ROCURONIUM BROMIDE 10 MG/ML (PF) SYRINGE
PREFILLED_SYRINGE | INTRAVENOUS | Status: DC | PRN
  Administered 2022-02-14: 70 mg via INTRAVENOUS

## 2022-02-14 MED ORDER — PROPOFOL 10 MG/ML IV BOLUS
INTRAVENOUS | Status: DC | PRN
  Administered 2022-02-14: 140 mg via INTRAVENOUS

## 2022-02-14 MED ORDER — DEXAMETHASONE SODIUM PHOSPHATE 10 MG/ML IJ SOLN
INTRAMUSCULAR | Status: DC | PRN
  Administered 2022-02-14: 5 mg via INTRAVENOUS

## 2022-02-14 MED ORDER — ACETAMINOPHEN 500 MG PO TABS: 1000.0000 mg | ORAL_TABLET | Freq: Once | ORAL | Status: DC | PRN

## 2022-02-14 MED ORDER — ACETAMINOPHEN 325 MG PO TABS
650.0000 mg | ORAL_TABLET | Freq: Four times a day (QID) | ORAL | Status: DC | PRN
  Administered 2022-02-14: 650 mg via ORAL
  Filled 2022-02-14: qty 2

## 2022-02-14 MED ORDER — GABAPENTIN 300 MG PO CAPS
900.0000 mg | ORAL_CAPSULE | Freq: Every day | ORAL | Status: DC
  Administered 2022-02-15 – 2022-02-19 (×5): 900 mg via ORAL
  Filled 2022-02-14 (×5): qty 3

## 2022-02-14 MED ORDER — LIDOCAINE 2% (20 MG/ML) 5 ML SYRINGE
INTRAMUSCULAR | Status: AC
  Filled 2022-02-14: qty 5

## 2022-02-14 MED ORDER — MUPIROCIN 2 % EX OINT
1.0000 | TOPICAL_OINTMENT | Freq: Two times a day (BID) | CUTANEOUS | Status: AC
  Administered 2022-02-14 – 2022-02-18 (×10): 1 via NASAL
  Filled 2022-02-14: qty 22

## 2022-02-14 MED ORDER — SODIUM CHLORIDE 0.9% FLUSH: 3.0000 mL | Freq: Two times a day (BID) | INTRAVENOUS | Status: DC

## 2022-02-14 MED ORDER — FENTANYL CITRATE (PF) 100 MCG/2ML IJ SOLN: 25.0000 ug | INTRAMUSCULAR | Status: DC | PRN

## 2022-02-14 MED ORDER — OXYCODONE HCL 5 MG PO TABS: 5.0000 mg | ORAL_TABLET | Freq: Once | ORAL | Status: DC | PRN

## 2022-02-14 MED ORDER — GABAPENTIN 300 MG PO CAPS
300.0000 mg | ORAL_CAPSULE | Freq: Every evening | ORAL | Status: DC
  Administered 2022-02-14 – 2022-02-18 (×5): 300 mg via ORAL
  Filled 2022-02-14 (×5): qty 1

## 2022-02-14 MED ORDER — ENOXAPARIN SODIUM 40 MG/0.4ML IJ SOSY
40.0000 mg | PREFILLED_SYRINGE | INTRAMUSCULAR | Status: DC
  Administered 2022-02-15 – 2022-02-16 (×2): 40 mg via SUBCUTANEOUS
  Filled 2022-02-14 (×2): qty 0.4

## 2022-02-14 SURGICAL SUPPLY — 56 items
BAG COUNTER SPONGE SURGICOUNT (BAG) ×1 IMPLANT
BIT DRILL FLEXIBLE LONG 12 (BIT) IMPLANT
BIT DRILL LONG 4.2 (BIT) IMPLANT
BIT DRILL SHORT 4.2 (BIT) IMPLANT
BLADE SURG 10 STRL SS (BLADE) ×2 IMPLANT
BNDG ELASTIC 4X5.8 VLCR STR LF (GAUZE/BANDAGES/DRESSINGS) IMPLANT
BNDG ELASTIC 6X5.8 VLCR STR LF (GAUZE/BANDAGES/DRESSINGS) IMPLANT
BOOT STEPPER DURA LG (SOFTGOODS) IMPLANT
BRUSH SCRUB EZ PLAIN DRY (MISCELLANEOUS) ×2 IMPLANT
CHLORAPREP W/TINT 26 (MISCELLANEOUS) ×1 IMPLANT
COVER SURGICAL LIGHT HANDLE (MISCELLANEOUS) ×2 IMPLANT
DRAPE C-ARM 42X72 X-RAY (DRAPES) ×1 IMPLANT
DRAPE C-ARMOR (DRAPES) ×1 IMPLANT
DRAPE HALF SHEET 40X57 (DRAPES) ×2 IMPLANT
DRAPE IMP U-DRAPE 54X76 (DRAPES) ×2 IMPLANT
DRAPE INCISE IOBAN 66X45 STRL (DRAPES) IMPLANT
DRAPE ORTHO SPLIT 77X108 STRL (DRAPES) ×2
DRAPE SURG ORHT 6 SPLT 77X108 (DRAPES) ×2 IMPLANT
DRAPE U-SHAPE 47X51 STRL (DRAPES) ×1 IMPLANT
DRILL BIT SHORT 4.2 (BIT) ×2
DRSG MEPITEL 4X7.2 (GAUZE/BANDAGES/DRESSINGS) IMPLANT
ELECT REM PT RETURN 9FT ADLT (ELECTROSURGICAL) ×1
ELECTRODE REM PT RTRN 9FT ADLT (ELECTROSURGICAL) ×1 IMPLANT
GAUZE SPONGE 4X4 12PLY STRL (GAUZE/BANDAGES/DRESSINGS) IMPLANT
GLOVE BIO SURGEON STRL SZ 6.5 (GLOVE) ×3 IMPLANT
GLOVE BIO SURGEON STRL SZ7.5 (GLOVE) ×4 IMPLANT
GLOVE BIOGEL PI IND STRL 6.5 (GLOVE) ×1 IMPLANT
GLOVE BIOGEL PI IND STRL 7.5 (GLOVE) ×1 IMPLANT
GOWN STRL REUS W/ TWL LRG LVL3 (GOWN DISPOSABLE) ×2 IMPLANT
GOWN STRL REUS W/TWL LRG LVL3 (GOWN DISPOSABLE) ×2
GUIDEWIRE 3.2X400 (WIRE) IMPLANT
HANDPIECE INTERPULSE COAX TIP (DISPOSABLE) ×1
KIT BASIN OR (CUSTOM PROCEDURE TRAY) ×1 IMPLANT
KIT TURNOVER KIT B (KITS) ×1 IMPLANT
NAIL TIB TFNA 10X360 (Nail) IMPLANT
PACK TOTAL JOINT (CUSTOM PROCEDURE TRAY) ×1 IMPLANT
PAD ARMBOARD 7.5X6 YLW CONV (MISCELLANEOUS) ×2 IMPLANT
PAD CAST 4YDX4 CTTN HI CHSV (CAST SUPPLIES) IMPLANT
PADDING CAST COTTON 4X4 STRL (CAST SUPPLIES) ×1
PADDING CAST COTTON 6X4 STRL (CAST SUPPLIES) IMPLANT
REAMER ROD DEEP FLUTE 2.5X950 (INSTRUMENTS) IMPLANT
SCREW LOCK IM NAIL 5X42 (Screw) IMPLANT
SCREW LOCK LP 5.36 (Screw) IMPLANT
SCREW LOCK LP 5X34 (Screw) IMPLANT
SCREW LOCK LP 5X64 (Screw) IMPLANT
SET HNDPC FAN SPRY TIP SCT (DISPOSABLE) IMPLANT
STAPLER VISISTAT 35W (STAPLE) ×1 IMPLANT
SUT ETHILON 3 0 FSL (SUTURE) IMPLANT
SUT MNCRL AB 3-0 PS2 18 (SUTURE) ×1 IMPLANT
SUT VIC AB 0 CT1 27 (SUTURE)
SUT VIC AB 0 CT1 27XBRD ANBCTR (SUTURE) IMPLANT
SUT VIC AB 2-0 CT1 27 (SUTURE)
SUT VIC AB 2-0 CT1 TAPERPNT 27 (SUTURE) IMPLANT
TOWEL GREEN STERILE (TOWEL DISPOSABLE) ×2 IMPLANT
TOWEL GREEN STERILE FF (TOWEL DISPOSABLE) ×1 IMPLANT
YANKAUER SUCT BULB TIP NO VENT (SUCTIONS) IMPLANT

## 2022-02-14 NOTE — Assessment & Plan Note (Addendum)
Left mid-distal tibial fracture Probable displaced left cuboid  Open fractures immobilized in splint -Initially had elevated lactate but this has downward trended with fluid boluses.  Patient has intact sensation and reportedly had good pulses per EDP prior to splint.  Low suspicion for compartmental syndrome. -Orthopedic surgery is aware and will evaluate and likely take to the OR in the morning.  Keep n.p.o. past midnight. -continue IV Cefazolin -PRN pain control with hydrocodone, IV morphine

## 2022-02-14 NOTE — Anesthesia Procedure Notes (Signed)
Procedure Name: Intubation Date/Time: 02/14/2022 8:57 AM  Performed by: Moshe Salisbury, CRNAPre-anesthesia Checklist: Patient identified, Emergency Drugs available, Suction available and Patient being monitored Patient Re-evaluated:Patient Re-evaluated prior to induction Oxygen Delivery Method: Circle System Utilized Preoxygenation: Pre-oxygenation with 100% oxygen Induction Type: IV induction Ventilation: Mask ventilation without difficulty Laryngoscope Size: Glidescope and 4 Tube type: Oral Tube size: 8.0 mm Number of attempts: 1 Airway Equipment and Method: Rigid stylet and Video-laryngoscopy Placement Confirmation: ETT inserted through vocal cords under direct vision, positive ETCO2 and breath sounds checked- equal and bilateral Secured at: 22 cm Tube secured with: Tape Dental Injury: Teeth and Oropharynx as per pre-operative assessment
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
## 2022-02-14 NOTE — Progress Notes (Signed)
   02/14/22 1244  Vitals  Temp 99 F (37.2 C)  Temp Source Oral  BP 134/69  MAP (mmHg) 87  BP Location Right Arm  BP Method Automatic  Patient Position (if appropriate) Lying  Pulse Rate 74  Pulse Rate Source Monitor  ECG Heart Rate 75  Resp 20  Level of Consciousness  Level of Consciousness Alert  MEWS COLOR  MEWS Score Color Green  Oxygen Therapy  SpO2 95 %  O2 Device Room Air  Pain Assessment  Pain Scale 0-10  Pain Score 2  ECG Monitoring  Telemetry Box Number [id number]  Tele Box Verification Completed by Second Verifier Completed  Glasgow Coma Scale  Eye Opening 4  Best Verbal Response (NON-intubated) 5  Best Motor Response 6  Glasgow Coma Scale Score 15  MEWS Score  MEWS Temp 0  MEWS Systolic 0  MEWS Pulse 0  MEWS RR 0  MEWS LOC 0  MEWS Score 0   Pt admitted to 4E21 CCMD notified Call bell within reach Pt Left DP pulse doppler
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
## 2022-02-14 NOTE — Progress Notes (Signed)
Orthopedic Tech Progress Note Patient Details:  Jared [name] 24-Jun-1960 [id number]  Ortho Devices Type of Ortho Device: CAM walker Ortho Device/Splint Location: LLE Ortho Device/Splint Interventions: Ordered   Post Interventions Patient Tolerated: Fair, Poor Instructions Provided: Care of device CAM boot dropped off at [place]. Vernona Rieger 02/14/2022, 11:52 AM
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
## 2022-02-14 NOTE — Progress Notes (Signed)
  Echocardiogram 2D Echocardiogram has been performed.  Marybelle Killings 02/14/2022, 5:01 PM
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
## 2022-02-14 NOTE — Assessment & Plan Note (Signed)
compensated 
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
## 2022-02-14 NOTE — Assessment & Plan Note (Signed)
Mild with K of 3.5. -Replete with oral potassium
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
## 2022-02-14 NOTE — Transfer of Care (Signed)
Immediate Anesthesia Transfer of Care Note  Patient: Jared [name]  Procedure(s) Performed: INTRAMEDULLARY (IM) NAIL TIBIAL (Left)  Patient Location: PACU  Anesthesia Type:General  Level of Consciousness: drowsy and patient cooperative  Airway & Oxygen Therapy: Patient Spontanous Breathing and Patient connected to nasal cannula oxygen  Post-op Assessment: Report given to RN and Post -op Vital signs reviewed and stable  Post vital signs: Reviewed and stable  Last Vitals:  Vitals Value Taken Time  BP 144/63 02/14/22 1020  Temp    Pulse 86 02/14/22 1022  Resp 11 02/14/22 1022  SpO2 94 % 02/14/22 1022  Vitals shown include unvalidated device data.  Last Pain:  Vitals:   02/14/22 0803  TempSrc: Oral  PainSc: 1       Patients Stated Pain Goal: 3 (54/65/03 5465)  Complications: No notable events documented.
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
## 2022-02-14 NOTE — Assessment & Plan Note (Signed)
Normocytic anemia Hgb of 11 down from 13.6 in 05/2021 -could be dilution from fluid bolus. No sources of obvious bleeding. -continue to monitor with repeat in the morning
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
## 2022-02-14 NOTE — Assessment & Plan Note (Addendum)
Current in normal sinus rhythm -not on anticoagulation -hold aspirin in anticipation for surgery

## 2022-02-14 NOTE — Assessment & Plan Note (Signed)
Suspect due to vasovagal stimulation as above causing bradycardia as well -echocardiogram in the morning -continue to hydrate overnight -hold all antihypertensives

## 2022-02-15 DIAGNOSIS — R001 Bradycardia, unspecified: Secondary | ICD-10-CM | POA: Diagnosis not present

## 2022-02-15 DIAGNOSIS — E876 Hypokalemia: Secondary | ICD-10-CM | POA: Diagnosis not present

## 2022-02-15 DIAGNOSIS — I5032 Chronic diastolic (congestive) heart failure: Secondary | ICD-10-CM | POA: Diagnosis not present

## 2022-02-15 LAB — BASIC METABOLIC PANEL
Anion gap: 6 (ref 5–15)
BUN: 14 mg/dL (ref 8–23)
CO2: 23 mmol/L (ref 22–32)
Calcium: 8.1 mg/dL — ABNORMAL LOW (ref 8.9–10.3)
Chloride: 108 mmol/L (ref 98–111)
Creatinine, Ser: 0.9 mg/dL (ref 0.61–1.24)
GFR, Estimated: >60 mL/min (ref >60)
Glucose, Bld: 107 mg/dL — ABNORMAL HIGH (ref 70–99)
Potassium: 4.1 mmol/L (ref 3.5–5.1)
Sodium: 137 mmol/L (ref 135–145)

## 2022-02-15 LAB — CBC WITH DIFFERENTIAL/PLATELET
Abs Immature Granulocytes: 0.02 10*3/uL (ref 0.00–0.07)
Basophils Absolute: 0 10*3/uL (ref 0.0–0.1)
Basophils Relative: 0 %
Eosinophils Absolute: 0 10*3/uL (ref 0.0–0.5)
Eosinophils Relative: 0 %
HCT: 24.5 % — ABNORMAL LOW (ref 39.0–52.0)
Hemoglobin: 8.5 g/dL — ABNORMAL LOW (ref 13.0–17.0)
Immature Granulocytes: 0 %
Lymphocytes Relative: 21 %
Lymphs Abs: 1.2 10*3/uL (ref 0.7–4.0)
MCH: 33.1 pg (ref 26.0–34.0)
MCHC: 34.7 g/dL (ref 30.0–36.0)
MCV: 95.3 fL (ref 80.0–100.0)
Monocytes Absolute: 0.5 10*3/uL (ref 0.1–1.0)
Monocytes Relative: 9 %
Neutro Abs: 4 10*3/uL (ref 1.7–7.7)
Neutrophils Relative %: 70 %
Platelets: 133 10*3/uL — ABNORMAL LOW (ref 150–400)
RBC: 2.57 MIL/uL — ABNORMAL LOW (ref 4.22–5.81)
RDW: 12.8 % (ref 11.5–15.5)
WBC: 5.8 10*3/uL (ref 4.0–10.5)
nRBC: 0 % (ref 0.0–0.2)

## 2022-02-15 LAB — MAGNESIUM: Magnesium: 2 mg/dL (ref 1.7–2.4)

## 2022-02-15 LAB — GLUCOSE, CAPILLARY: Glucose-Capillary: 88 mg/dL (ref 70–99)

## 2022-02-15 MED ORDER — OXYCODONE HCL 5 MG PO TABS: ORAL_TABLET | ORAL | 0 refills | Status: DC

## 2022-02-15 MED ORDER — ASPIRIN 325 MG PO TABS: 325.0000 mg | ORAL_TABLET | Freq: Every day | ORAL | 0 refills | Status: DC

## 2022-02-15 MED ORDER — METHOCARBAMOL 500 MG PO TABS
500.0000 mg | ORAL_TABLET | Freq: Three times a day (TID) | ORAL | Status: DC | PRN
  Administered 2022-02-16 – 2022-02-17 (×3): 500 mg via ORAL
  Filled 2022-02-15 (×3): qty 1

## 2022-02-15 MED ORDER — ACETAMINOPHEN 500 MG PO TABS: 1000.0000 mg | ORAL_TABLET | Freq: Three times a day (TID) | ORAL | 0 refills | Status: DC
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
## 2022-02-15 NOTE — Progress Notes (Signed)
ORTHOPAEDIC PROGRESS NOTE  s/p Procedure(s): INTRAMEDULLARY (IM) NAIL TIBIAL on 10/20 with Dr. Doreatha Martin  SUBJECTIVE: Reports mild pain about operative site. He is surprised with how good he feels. No chest pain. No SOB. No nausea/vomiting. No other complaints.  OBJECTIVE: PE: General: sitting up in hospital bed, NAD LLE: dressings CDI, compartments soft and compressible, CAM boot in place, intact EHL/TA/GSC, he endorses distal sensation, warm well perfused foot   Vitals:   02/15/22 0500 02/15/22 0800  BP:  (!) 159/119  Pulse: 83 90  Resp: 18 (!) 21  Temp:  99 F (37.2 C)  SpO2: 90% 95%   Stable post-op images.   ASSESSMENT: Jared [name] is a 61 y.o. male doing well postoperatively. POD#1  PLAN: Weightbearing: WBAT LLE Insicional and dressing care: Reinforce dressings as needed Orthopedic device(s): CAM boot Showering: Hold for now VTE prophylaxis: Lovenox while inpatient, likely discharge on aspirin 325 mg Pain control: PRN pain medications, minimize narcotics as able Antibiotics: postoperative Ancef for open fracture prophylaxis Dispo: TBD. PT/OT evals today.  Contact information:  After hours and holidays please check [email or web address] for group call information for [facility]   Noemi Chapel, PA-C 02/15/2022
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
## 2022-02-15 NOTE — Discharge Instructions (Signed)
Orthopaedic Trauma Service Discharge Instructions   General Discharge Instructions  WEIGHT BEARING STATUS:Weight bearing as tolerated  Wound Care: Incisions can be left open to air if there is no drainage. If incision continues to have drainage, follow wound care instructions below. Okay to shower if no drainage from incisions.  DVT/PE prophylaxis: Aspirin  Diet: as you were eating previously.  Can use over the counter stool softeners and bowel preparations, such as Miralax, to help with bowel movements.  Narcotics can be constipating.  Be sure to drink plenty of fluids  PAIN MEDICATION USE AND EXPECTATIONS  You have likely been given narcotic medications to help control your pain.  After a traumatic event that results in an fracture (broken bone) with or without surgery, it is ok to use narcotic pain medications to help control one's pain.  We understand that everyone responds to pain differently and each individual patient will be evaluated on a regular basis for the continued need for narcotic medications. Ideally, narcotic medication use should last no more than 6-8 weeks (coinciding with fracture healing).   As a patient it is your responsibility as well to monitor narcotic medication use and report the amount and frequency you use these medications when you come to your office visit.   We would also advise that if you are using narcotic medications, you should take a dose prior to therapy to maximize you participation.  IF YOU ARE ON NARCOTIC MEDICATIONS IT IS NOT PERMISSIBLE TO OPERATE A MOTOR VEHICLE (MOTORCYCLE/CAR/TRUCK/MOPED) OR HEAVY MACHINERY DO NOT MIX NARCOTICS WITH OTHER CNS (CENTRAL NERVOUS SYSTEM) DEPRESSANTS SUCH AS ALCOHOL   STOP SMOKING OR USING NICOTINE PRODUCTS!!!!  As discussed nicotine severely impairs your body's ability to heal surgical and traumatic wounds but also impairs bone healing.  Wounds and bone heal by forming microscopic blood vessels (angiogenesis)  and nicotine is a vasoconstrictor (essentially, shrinks blood vessels).  Therefore, if vasoconstriction occurs to these microscopic blood vessels they essentially disappear and are unable to deliver necessary nutrients to the healing tissue.  This is one modifiable factor that you can do to dramatically increase your chances of healing your injury.    (This means no smoking, no nicotine gum, patches, etc)  DO NOT USE NONSTEROIDAL ANTI-INFLAMMATORY DRUGS (NSAID'S)  Using products such as Advil (ibuprofen), Aleve (naproxen), Motrin (ibuprofen) for additional pain control during fracture healing can delay and/or prevent the healing response.  If you would like to take over the counter (OTC) medication, Tylenol (acetaminophen) is ok.  However, some narcotic medications that are given for pain control contain acetaminophen as well. Therefore, you should not exceed more than 4000 mg of tylenol in a day if you do not have liver disease.  Also note that there are may OTC medicines, such as cold medicines and allergy medicines that my contain tylenol as well.  If you have any questions about medications and/or interactions please ask your doctor/PA or your pharmacist.      ICE AND ELEVATE INJURED/OPERATIVE EXTREMITY  Using ice and elevating the injured extremity above your heart can help with swelling and pain control.  Icing in a pulsatile fashion, such as 20 minutes on and 20 minutes off, can be followed.    Do not place ice directly on skin. Make sure there is a barrier between to skin and the ice pack.    Using frozen items such as frozen peas works well as the conform nicely to the are that needs to be iced.  USE  AN ACE WRAP OR TED HOSE FOR SWELLING CONTROL  In addition to icing and elevation, Ace wraps or TED hose are used to help limit and resolve swelling.  It is recommended to use Ace wraps or TED hose until you are informed to stop.    When using Ace Wraps start the wrapping distally (farthest away  from the body) and wrap proximally (closer to the body)   Example: If you had surgery on your leg or thing and you do not have a splint on, start the ace wrap at the toes and work your way up to the thigh        If you had surgery on your upper extremity and do not have a splint on, start the ace wrap at your fingers and work your way up to the upper arm  IF YOU ARE IN A SPLINT OR CAST DO NOT [place]   If your splint gets wet for any reason please contact the office immediately. You may shower in your splint or cast as long as you keep it dry.  This can be done by wrapping in a cast cover or garbage back (or similar)  Do Not stick any thing down your splint or cast such as pencils, money, or hangers to try and scratch yourself with.  If you feel itchy take benadryl as prescribed on the bottle for itching  IF YOU ARE IN A CAM BOOT (BLACK BOOT)  You may remove boot periodically. Perform daily dressing changes as noted below.  Wash the liner of the boot regularly and wear a sock when wearing the boot. It is recommended that you sleep in the boot until told otherwise   CALL THE OFFICE WITH ANY QUESTIONS OR CONCERNS: [phone]   VISIT OUR WEBSITE FOR ADDITIONAL INFORMATION: [email or web address]      Discharge Pin Site Instructions  Dress pins daily with Kerlix roll starting on POD 2. Wrap the Kerlix so that it tamps the skin down around the pin-skin interface to prevent/limit motion of the skin relative to the pin.  (Pin-skin motion is the primary cause of pain and infection related to external fixator pin sites).  Remove any crust or coagulum that may obstruct drainage with a saline moistened gauze or soap and water.  After POD 3, if there is no discernable drainage on the pin site dressing, the interval for change can by increased to every other day.  You may shower with the fixator, cleaning all pin sites gently with soap and water.  If you have a surgical wound this needs  to be completely dry and without drainage before showering.  The extremity can be lifted by the fixator to facilitate wound care and transfers.  Notify the office/Doctor if you experience increasing drainage, redness, or pain from a pin site, or if you notice purulent (thick, snot-like) drainage.  Discharge Wound Care Instructions  Do NOT apply any ointments, solutions or lotions to pin sites or surgical wounds.  These prevent needed drainage and even though solutions like hydrogen peroxide kill bacteria, they also damage cells lining the pin sites that help fight infection.  Applying lotions or ointments can keep the wounds moist and can cause them to breakdown and open up as well. This can increase the risk for infection. When in doubt call the office.  Surgical incisions should be dressed daily.  If any drainage is noted, use one layer of adaptic or Mepitel, then gauze, Kerlix, and an ace wrap. -  These dressing supplies should be available at local medical supply stores [facility], [facility], etc) as well as Insurance claims handlerlocal pharmacy ([facility], [facility], [facility], etc)  Once the incision is completely dry and without drainage, it may be left open to air out.  Showering may begin 36-48 hours later.  Cleaning gently with soap and water.  Traumatic wounds should be dressed daily as well.    One layer of adaptic, gauze, Kerlix, then ace wrap.  The adaptic can be discontinued once the draining has ceased    If you have a wet to dry dressing: wet the gauze with saline the squeeze as much saline out so the gauze is moist (not soaking wet), place moistened gauze over wound, then place a dry gauze over the moist one, followed by Kerlix wrap, then ace wrap.   Ramond Marrowax Varkey MD, MPH Alfonse Alpersaroline Ajai Terhaar, PA-C [facility] [address], [address] [phone] (tel)   [phone] (fax)   POST-OPERATIVE INSTRUCTIONS - TOTAL SHOULDER REPLACEMENT    WOUND CARE You may leave the  operative dressing in place until your follow-up appointment. KEEP THE INCISIONS CLEAN AND DRY. There may be a small amount of fluid/bleeding leaking at the surgical site. This is normal after surgery.  If it fills with liquid or blood please call us immediately to change it for you. Use the provided ice machine or Ice packs as often as possible for the first 3-4 days, then as needed for pain relief.   Keep a layer of cloth or a shirt between your skin and the cooling unit to prevent frost bite as it can get very cold.  SHOWERING: - You may shower on Post-Op Day #2.  - The dressing is water resistant but do not scrub it as it may start to peel up.   - You may remove the sling for showering - Gently pat the area dry.  - Do not soak the shoulder in water.  - Do not go swimming in the pool or ocean until your incision has completely healed (about 4-6 weeks after surgery) - KEEP THE INCISIONS CLEAN AND DRY.  EXERCISES Wear the sling at all times  You may remove the sling for showering, but keep the arm across the chest or in a secondary sling.    Accidental/Purposeful External Rotation and shoulder flexion (reaching behind you) is to be avoided at all costs for the first month. It is ok to come out of your sling if your are sitting and have assistance for eating.   Do not lift anything heavier than 1 pound until we discuss it further in clinic.  It is normal for your fingers/hand to become more swollen after surgery and discolored from bruising.   This will resolve over the first few weeks usually after surgery. Please continue to ambulate and do not stay sitting or lying for too long.  Perform foot and wrist pumps to assist in circulation.   REGIONAL ANESTHESIA (NERVE BLOCKS) The anesthesia team may have performed a nerve block for you this is a great tool used to minimize pain.   The block may start wearing off overnight (between 8-24 hours postop) When the block wears off, your pain  may go from nearly zero to the pain you would have had postop without the block. This is an abrupt transition but nothing dangerous is happening.   This can be a challenging period but utilize your as needed pain medications to try and manage this period. We suggest  you use the pain medication the first night prior to going to bed, to ease this transition.  You may take an extra dose of narcotic when this happens if needed   POST-OP MEDICATIONS- Multimodal approach to pain control In general your pain will be controlled with a combination of substances.  Prescriptions unless otherwise discussed are electronically sent to your pharmacy.  This is a carefully made plan we use to minimize narcotic use.     Celebrex - Anti-inflammatory medication taken on a scheduled basis Acetaminophen - Non-narcotic pain medicine taken on a scheduled basis  Oxycodone - This is a strong narcotic, to be used only on an "as needed" basis for SEVERE pain. Aspirin - This medicine is used to minimize the risk of blood clots after surgery. Omeprazole - daily medicine to protect your stomach while taking anti-inflammatories.      FOLLOW-UP If you develop a Fever (>101.5), Redness or Drainage from the surgical incision site, please call our office to arrange for an evaluation. Please call the office to schedule a follow-up appointment for a wound check, 7-10 days post-operatively.  IF YOU HAVE ANY QUESTIONS, PLEASE FEEL FREE TO CALL OUR OFFICE.  HELPFUL INFORMATION  Your arm will be in a sling following surgery. You will be in this sling for the next 4 weeks.   You may be more comfortable sleeping in a semi-seated position the first few nights following surgery.  Keep a pillow propped under the elbow and forearm for comfort.  If you have a recliner type of chair it might be beneficial.  If not that is fine too, but it would be helpful to sleep propped up with pillows behind your operated shoulder as well under your  elbow and forearm.  This will reduce pulling on the suture lines.  When dressing, put your operative arm in the sleeve first.  When getting undressed, take your operative arm out last.  Loose fitting, button-down shirts are recommended.  In most states it is against the law to drive while your arm is in a sling. And certainly against the law to drive while taking narcotics.  You may return to work/school in the next couple of days when you feel up to it. Desk work and typing in the sling is fine.  We suggest you use the pain medication the first night prior to going to bed, in order to ease any pain when the anesthesia wears off. You should avoid taking pain medications on an empty stomach as it will make you nauseous.  You should wean off your narcotic medicines as soon as you are able.     Most patients will be off or using minimal narcotics before their first postop appointment.   Do not drink alcoholic beverages or take illicit drugs when taking pain medications.  Pain medication may make you constipated.  Below are a few solutions to try in this order: Decrease the amount of pain medication if you aren't having pain. Drink lots of decaffeinated fluids. Drink prune juice and/or each dried prunes  If the first 3 don't work start with additional solutions Take Colace - an over-the-counter stool softener Take Senokot - an over-the-counter laxative Take Miralax - a stronger over-the-counter laxative   Dental Antibiotics:  In most cases prophylactic antibiotics for Dental procdeures after total joint surgery are not necessary.  Exceptions are as follows:  1. History of prior total joint infection  2. Severely immunocompromised (Organ Transplant, cancer chemotherapy, Rheumatoid biologic meds such as  [place])  3. Poorly controlled diabetes (A1C &gt; 8.0, blood glucose over 200)  If you have one of these conditions, contact your surgeon for an antibiotic prescription, prior to  your dental procedure.   For more information including helpful videos and documents visit our website:   [email or web address]
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
## 2022-02-15 NOTE — Progress Notes (Signed)
OT Cancellation Note  Patient Details Name: Javontay Vandam MRN: [id number] DOB: 1961-01-08   Cancelled Treatment:    Reason Eval/Treat Not Completed: OT screened, no needs identified, will sign off. Discussed with PT, pt independent with ADLs in room, mobilizing well. Acute OT will s/o, please reconsult if there is a change in pt status.  Lynnda Child, OTD, OTR/L Acute Rehab [phone]   Kaylyn Lim 02/15/2022, 10:48 AM

## 2022-02-16 ENCOUNTER — Encounter (HOSPITAL_COMMUNITY): Payer: Self-pay | Admitting: Family Medicine

## 2022-02-16 ENCOUNTER — Inpatient Hospital Stay (HOSPITAL_COMMUNITY): Payer: BC Managed Care – PPO

## 2022-02-16 ENCOUNTER — Inpatient Hospital Stay (HOSPITAL_COMMUNITY): Payer: BC Managed Care – PPO | Admitting: Certified Registered"

## 2022-02-16 ENCOUNTER — Encounter (HOSPITAL_COMMUNITY)
Admission: EM | Disposition: A | Discharge status: Home Health | Payer: Self-pay | Source: Home / Self Care | Attending: Internal Medicine

## 2022-02-16 DIAGNOSIS — R001 Bradycardia, unspecified: Secondary | ICD-10-CM | POA: Diagnosis not present

## 2022-02-16 DIAGNOSIS — S51012A Laceration without foreign body of left elbow, initial encounter: Secondary | ICD-10-CM | POA: Diagnosis not present

## 2022-02-16 DIAGNOSIS — I5032 Chronic diastolic (congestive) heart failure: Secondary | ICD-10-CM | POA: Diagnosis not present

## 2022-02-16 HISTORY — PX: SHOULDER CLOSED REDUCTION: SHX1051

## 2022-02-16 LAB — GLUCOSE, CAPILLARY: Glucose-Capillary: 108 mg/dL — ABNORMAL HIGH (ref 70–99)

## 2022-02-16 SURGERY — CLOSED REDUCTION, SHOULDER
Anesthesia: General | Site: Shoulder | Laterality: Left

## 2022-02-16 MED ORDER — CHLORHEXIDINE GLUCONATE 0.12 % MT SOLN
OROMUCOSAL | Status: AC
  Administered 2022-02-16: 15 mL via OROMUCOSAL
  Filled 2022-02-16: qty 15

## 2022-02-16 MED ORDER — CHLORHEXIDINE GLUCONATE 4 % EX LIQD: 60.0000 mL | Freq: Once | CUTANEOUS | Status: DC

## 2022-02-16 MED ORDER — LACTATED RINGERS IV SOLN: INTRAVENOUS | Status: DC

## 2022-02-16 MED ORDER — ORAL CARE MOUTH RINSE: 15.0000 mL | Freq: Once | OROMUCOSAL | Status: AC

## 2022-02-16 MED ORDER — AMISULPRIDE (ANTIEMETIC) 5 MG/2ML IV SOLN: 10.0000 mg | Freq: Once | INTRAVENOUS | Status: DC | PRN

## 2022-02-16 MED ORDER — SUCCINYLCHOLINE CHLORIDE 200 MG/10ML IV SOSY
PREFILLED_SYRINGE | INTRAVENOUS | Status: AC
  Filled 2022-02-16: qty 10

## 2022-02-16 MED ORDER — FENTANYL CITRATE (PF) 100 MCG/2ML IJ SOLN: 25.0000 ug | INTRAMUSCULAR | Status: DC | PRN

## 2022-02-16 MED ORDER — POVIDONE-IODINE 10 % EX SWAB
2.0000 | Freq: Once | CUTANEOUS | Status: AC
  Administered 2022-02-16: 2 via TOPICAL

## 2022-02-16 MED ORDER — MIDAZOLAM HCL 2 MG/2ML IJ SOLN
INTRAMUSCULAR | Status: AC
  Filled 2022-02-16: qty 2

## 2022-02-16 MED ORDER — FENTANYL CITRATE (PF) 100 MCG/2ML IJ SOLN
INTRAMUSCULAR | Status: DC | PRN
  Administered 2022-02-16: 50 ug via INTRAVENOUS

## 2022-02-16 MED ORDER — CHLORHEXIDINE GLUCONATE 0.12 % MT SOLN: 15.0000 mL | Freq: Once | OROMUCOSAL | Status: AC

## 2022-02-16 MED ORDER — LIDOCAINE 2% (20 MG/ML) 5 ML SYRINGE
INTRAMUSCULAR | Status: AC
  Filled 2022-02-16: qty 5

## 2022-02-16 MED ORDER — PHENYLEPHRINE 80 MCG/ML (10ML) SYRINGE FOR IV PUSH (FOR BLOOD PRESSURE SUPPORT)
PREFILLED_SYRINGE | INTRAVENOUS | Status: AC
  Filled 2022-02-16: qty 10

## 2022-02-16 MED ORDER — FENTANYL CITRATE (PF) 250 MCG/5ML IJ SOLN
INTRAMUSCULAR | Status: AC
  Filled 2022-02-16: qty 5

## 2022-02-16 MED ORDER — LIDOCAINE 2% (20 MG/ML) 5 ML SYRINGE
INTRAMUSCULAR | Status: DC | PRN
  Administered 2022-02-16: 60 mg via INTRAVENOUS

## 2022-02-16 MED ORDER — ONDANSETRON HCL 4 MG/2ML IJ SOLN
INTRAMUSCULAR | Status: AC
  Filled 2022-02-16: qty 2

## 2022-02-16 MED ORDER — PROPOFOL 10 MG/ML IV BOLUS
INTRAVENOUS | Status: AC
  Filled 2022-02-16: qty 20

## 2022-02-16 MED ORDER — ROCURONIUM BROMIDE 10 MG/ML (PF) SYRINGE
PREFILLED_SYRINGE | INTRAVENOUS | Status: AC
  Filled 2022-02-16: qty 10

## 2022-02-16 MED ORDER — PROPOFOL 10 MG/ML IV BOLUS
INTRAVENOUS | Status: DC | PRN
  Administered 2022-02-16: 120 mg via INTRAVENOUS

## 2022-02-16 MED ORDER — MIDAZOLAM HCL 2 MG/2ML IJ SOLN
INTRAMUSCULAR | Status: DC | PRN
  Administered 2022-02-16: 2 mg via INTRAVENOUS

## 2022-02-16 MED ORDER — DEXAMETHASONE SODIUM PHOSPHATE 10 MG/ML IJ SOLN
INTRAMUSCULAR | Status: AC
  Filled 2022-02-16: qty 1

## 2022-02-16 SURGICAL SUPPLY — 2 items
PILLOW ARM CARTER ADULT (MISCELLANEOUS) IMPLANT
SLING ARM IMMOBILIZER LRG (SOFTGOODS) IMPLANT

## 2022-02-16 NOTE — Anesthesia Postprocedure Evaluation (Signed)
Anesthesia Post Note  Patient: [name]  Procedure(s) Performed: CLOSED REDUCTION SHOULDER (Left: Shoulder)     Patient location during evaluation: PACU Anesthesia Type: General Level of consciousness: awake and alert Pain management: pain level controlled Vital Signs Assessment: post-procedure vital signs reviewed and stable Respiratory status: spontaneous breathing, nonlabored ventilation, respiratory function stable and patient connected to nasal cannula oxygen Cardiovascular status: blood pressure returned to baseline and stable Postop Assessment: no apparent nausea or vomiting Anesthetic complications: no   No notable events documented.  Last Vitals:  Vitals:   02/16/22 1420 02/16/22 1435  BP: (!) 162/84 (!) 164/88  Pulse: 83 82  Resp: 16 16  Temp:  36.9 C  SpO2: 98% 99%    Last Pain:  Vitals:   02/16/22 1435  TempSrc:   PainSc: 0-No pain                 Tiajuana Amass

## 2022-02-16 NOTE — Plan of Care (Signed)
  Problem: Education: Goal: Knowledge of condition and prescribed therapy will improve Outcome: Progressing   Problem: Cardiac: Goal: Will achieve and/or maintain adequate cardiac output Outcome: Progressing   Problem: Physical Regulation: Goal: Complications related to the disease process, condition or treatment will be avoided or minimized Outcome: Progressing   Problem: Education: Goal: Knowledge of General Education information will improve Description: Including pain rating scale, medication(s)/side effects and non-pharmacologic comfort measures Outcome: Progressing   Problem: Health Behavior/Discharge Planning: Goal: Ability to manage health-related needs will improve Outcome: Progressing   Problem: Clinical Measurements: Goal: Ability to maintain clinical measurements within normal limits will improve Outcome: Progressing Goal: Will remain free from infection Outcome: Progressing Goal: Diagnostic test results will improve Outcome: Progressing Goal: Cardiovascular complication will be avoided Outcome: Progressing   Problem: Activity: Goal: Risk for activity intolerance will decrease Outcome: Progressing   Problem: Nutrition: Goal: Adequate nutrition will be maintained Outcome: Progressing   Problem: Coping: Goal: Level of anxiety will decrease Outcome: Progressing   Problem: Elimination: Goal: Will not experience complications related to bowel motility Outcome: Progressing Goal: Will not experience complications related to urinary retention Outcome: Progressing   Problem: Pain Managment: Goal: General experience of comfort will improve Outcome: Progressing   Problem: Safety: Goal: Ability to remain free from injury will improve Outcome: Progressing   Problem: Skin Integrity: Goal: Risk for impaired skin integrity will decrease Outcome: Progressing

## 2022-02-16 NOTE — H&P (View-Only) (Signed)
ORTHOPAEDIC PROGRESS NOTE  s/p Procedure(s): INTRAMEDULLARY (IM) NAIL TIBIAL on 10/20 with Dr. Haddix  SUBJECTIVE: Reports  minimal pain in the left leg. Ambulating well with walker. Mostly bothered right now by left shoulder pain. Significant pain with gentle shoulder ROM. Shoulder MRI ordered yesterday but not completed yet.  OBJECTIVE: PE: General: sitting up in hospital bed, NAD LLE: dressings CDI, compartments soft and compressible, CAM boot in place, intact EHL/TA/GSC, he endorses distal sensation, warm well perfused foot LUE: no pain with left elbow, wrist ROM, no erythema about shoulder. Anterior shoulder tenderness. Significant discomfort with gentle shoulder ROM   Vitals:   02/15/22 1138 02/15/22 1642  BP: (!) 140/75 126/79  Pulse: 90   Resp: 16 (!) 9  Temp: 98.2 F (36.8 C)   SpO2: 95%    Stable post-op images.   ASSESSMENT: Jared [name] is a 61 y.o. male doing well postoperatively. POD#1 L shoulder pain concerning for possible traumatic rotator cuff tear. Awaiting MRI.  PLAN: Weightbearing: WBAT LLE Insicional and dressing care: Reinforce dressings as needed Orthopedic device(s): CAM boot Showering: Hold for now VTE prophylaxis: Lovenox while inpatient, likely discharge on aspirin 325 mg Pain control: PRN pain medications, minimize narcotics as able Antibiotics: postoperative Ancef for open fracture prophylaxis Dispo: TBD. PT/OT evals today.  Contact information:  After hours and holidays please check [email or web address] for group call information for [facility]  Jarin Cornfield, MD Orthopaedic Surgery  

## 2022-02-16 NOTE — Interval H&P Note (Signed)
MRI obtained this morning demonstrates posterior shoulder dislocation with reverse Hill-Sachs lesion.  Unclear in regards to when patient dislocated as chest CT in the ER showed a reduced left shoulder.  Concern for instability.  Also concerned that the glenoid is locked.  Discussed possibility of unsuccessful reduction.  Discussed also significant rotator cuff tear and given the findings of the rotator cuff tear Hill-Sachs deformity, and instability will likely need a secondary surgery either rotator cuff repair versus shoulder arthroplasty. Patient expresses understanding of the risks and benefits.  We will plan for immobilization in the shoulder immobilizer postoperatively.

## 2022-02-16 NOTE — Transfer of Care (Signed)
Immediate Anesthesia Transfer of Care Note  Patient: Jared [name]  Procedure(s) Performed: CLOSED REDUCTION SHOULDER (Left: Shoulder)  Patient Location: PACU  Anesthesia Type:MAC and General  Level of Consciousness: awake, alert , oriented, and patient cooperative  Airway & Oxygen Therapy: Patient Spontanous Breathing  Post-op Assessment: Report given to RN and Post -op Vital signs reviewed and stable  Post vital signs: Reviewed and stable  Last Vitals:  Vitals Value Taken Time  BP    Temp    Pulse    Resp    SpO2      Last Pain:  Vitals:   02/16/22 1224  TempSrc: Oral  PainSc: 6       Patients Stated Pain Goal: 3 (26/83/41 9622)  Complications: No notable events documented.

## 2022-02-16 NOTE — TOC CAGE-AID Note (Signed)
Transition of Care [facility]) - CAGE-AID Screening   Patient Details  Name: Jared [name] MRN: [id number] Date of Birth: [birth year]  Transition of Care (TOC) CM/SW Contact:    Army Melia, RN Phone Number: 02/16/2022, 12:29 AM   CAGE-AID Screening:    Have You Ever Felt You Ought to Cut Down on Your Drinking or Drug Use?: No Have People Annoyed You By Critizing Your Drinking Or Drug Use?: No Have You Felt Bad Or Guilty About Your Drinking Or Drug Use?: No Have You Ever Had a Drink or Used Drugs First Thing In The Morning to Steady Your Nerves or to Get Rid of a Hangover?: No CAGE-AID Score: 0  Substance Abuse Education Offered: No (no drug/alcohol use, no resources indicated)

## 2022-02-16 NOTE — Anesthesia Preprocedure Evaluation (Signed)
Anesthesia Evaluation  Patient identified by MRN, date of birth, ID band Patient awake    Reviewed: Allergy & Precautions, NPO status , Patient's Chart, lab work & pertinent test results  Airway Mallampati: II  TM Distance: >3 FB Neck ROM: Full    Dental no notable dental hx. (+) Dental Advisory Given, Chipped,    Pulmonary neg pulmonary ROS, former smoker,    Pulmonary exam normal breath sounds clear to auscultation       Cardiovascular hypertension, Pt. on medications +CHF  Normal cardiovascular exam+ dysrhythmias Atrial Fibrillation  Rhythm:Regular Rate:Normal  TTE 2017 - Left ventricle: The cavity size was mildly dilated. Systolic  function was normal. The estimated ejection fraction was in the  range of 55% to 60%. Wall motion was normal; there were no  regional wall motion abnormalities. Features are consistent with  a pseudonormal left ventricular filling pattern, with concomitant  abnormal relaxation and increased filling pressure (grade 2  diastolic dysfunction).  - Aortic valve: There was mild regurgitation.  - Atrial septum: There was increased thickness of the septum,  consistent with lipomatous hypertrophy.   Neuro/Psych PSYCHIATRIC DISORDERS Anxiety Depression negative neurological ROS     GI/Hepatic PUD, GERD  ,(+)     substance abuse  alcohol use,   Endo/Other  negative endocrine ROS  Renal/GU negative Renal ROS  negative genitourinary   Musculoskeletal negative musculoskeletal ROS (+)   Abdominal   Peds  Hematology  (+) Blood dyscrasia, anemia ,   Anesthesia Other Findings   Reproductive/Obstetrics                             Anesthesia Physical  Anesthesia Plan  ASA: 3  Anesthesia Plan: General   Post-op Pain Management:    Induction: Intravenous  PONV Risk Score and Plan: 2 and Midazolam and Ondansetron  Airway Management Planned: Oral ETT  and Mask  Additional Equipment:   Intra-op Plan:   Post-operative Plan: Extubation in OR  Informed Consent: I have reviewed the patients History and Physical, chart, labs and discussed the procedure including the risks, benefits and alternatives for the proposed anesthesia with the patient or authorized representative who has indicated his/her understanding and acceptance.     Dental advisory given  Plan Discussed with: CRNA  Anesthesia Plan Comments:         Anesthesia Quick Evaluation

## 2022-02-16 NOTE — Progress Notes (Signed)
ORTHOPAEDIC PROGRESS NOTE  s/p Procedure(s): INTRAMEDULLARY (IM) NAIL TIBIAL on 10/20 with Dr. Doreatha Martin  SUBJECTIVE: Reports  minimal pain in the left leg. Ambulating well with walker. Mostly bothered right now by left shoulder pain. Significant pain with gentle shoulder ROM. Shoulder MRI ordered yesterday but not completed yet.  OBJECTIVE: PE: General: sitting up in hospital bed, NAD LLE: dressings CDI, compartments soft and compressible, CAM boot in place, intact EHL/TA/GSC, he endorses distal sensation, warm well perfused foot LUE: no pain with left elbow, wrist ROM, no erythema about shoulder. Anterior shoulder tenderness. Significant discomfort with gentle shoulder ROM   Vitals:   02/15/22 1138 02/15/22 1642  BP: (!) 140/75 126/79  Pulse: 90   Resp: 16 (!) 9  Temp: 98.2 F (36.8 C)   SpO2: 95%    Stable post-op images.   ASSESSMENT: Jared [name] is a 61 y.o. male doing well postoperatively. POD#1 L shoulder pain concerning for possible traumatic rotator cuff tear. Awaiting MRI.  PLAN: Weightbearing: WBAT LLE Insicional and dressing care: Reinforce dressings as needed Orthopedic device(s): CAM boot Showering: Hold for now VTE prophylaxis: Lovenox while inpatient, likely discharge on aspirin 325 mg Pain control: PRN pain medications, minimize narcotics as able Antibiotics: postoperative Ancef for open fracture prophylaxis Dispo: TBD. PT/OT evals today.  Contact information:  After hours and holidays please check [email or web address] for group call information for [facility]  Charlies Constable, MD Orthopaedic Surgery

## 2022-02-16 NOTE — Op Note (Signed)
02/16/2022  2:04 PM  PATIENT:  Jared [name]    PRE-OPERATIVE DIAGNOSIS:  Displocated left Shoulder  POST-OPERATIVE DIAGNOSIS:  Same  PROCEDURE:  CLOSED REDUCTION LEFT SHOULDER  SURGEON:  Jaicob Dia A Loi Rennaker, MD  PHYSICIAN ASSISTANT: none  ANESTHESIA:   General  PREOPERATIVE INDICATIONS:  Jared [name] is a  61 y.o. male with a diagnosis of a posterior dislocated left shoulder with massive rotator cuff tear and reverse Hill-Sachs lesion.  Given the acute dislocation recommended closed reduction attempt under sedation.    The risks benefits and alternatives were discussed with the patient preoperatively including but not limited to the risks of infection, bleeding, nerve injury, cardiopulmonary complications, the need for revision surgery, among others, and the patient was willing to proceed.  ESTIMATED BLOOD LOSS: None  OPERATIVE IMPLANTS: None  OPERATIVE FINDINGS: Successful closed reduction of posterior shoulder dislocation confirmed with fluoroscopy.  Significant instability.  OPERATIVE PROCEDURE:  Patient was brought to the operating room and positioned on the OR table.  General anesthesia was induced and a timeout was called.  With gentle traction internal rotation a palpable clunk was felt as the shoulder reduced into the glenohumeral joint.  Taking the hand or arm through range of motion there was notable instability with internal rotation.  Fluoroscopy confirmed including AP and axillary that the glenohumeral joint was reduced.  Patient was placed into a shoulder immobilizer.  He was awoken from anesthesia taken recovery room in stable condition.  Post op recs: WB: Nonweightbearing left upper extremity in sling at all times DVT prophylaxis: Continue Lovenox Follow up: Discussed case with Dr. Griffin Basil given massive rotator cuff tear, reverse Hill-Sachs lesion, and instability anticipate will need reverse arthroplasty for definitive fixation.  Timing TBD but could be as soon  as tomorrow.  Charlies Constable, MD Orthopaedic Surgery
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
## 2022-02-16 NOTE — Progress Notes (Signed)
OT Cancellation Note  Patient Details Name: Shaunak Kreis MRN: [id number] DOB: 1960-08-18   Cancelled Treatment:    Reason Eval/Treat Not Completed: Patient at procedure or test/ unavailable (will follow up as schedule permits)  Lynnda Child, OTD, OTR/L Acute Rehab [phone]   Kaylyn Lim 02/16/2022, 12:40 PM

## 2022-02-17 ENCOUNTER — Inpatient Hospital Stay (HOSPITAL_COMMUNITY): Payer: BC Managed Care – PPO | Admitting: Anesthesiology

## 2022-02-17 ENCOUNTER — Encounter (HOSPITAL_COMMUNITY)
Admission: EM | Disposition: A | Discharge status: Home Health | Payer: Self-pay | Source: Home / Self Care | Attending: Internal Medicine

## 2022-02-17 ENCOUNTER — Encounter (HOSPITAL_COMMUNITY): Payer: Self-pay | Admitting: Orthopedic Surgery

## 2022-02-17 ENCOUNTER — Inpatient Hospital Stay (HOSPITAL_COMMUNITY): Payer: BC Managed Care – PPO

## 2022-02-17 ENCOUNTER — Other Ambulatory Visit: Payer: Self-pay

## 2022-02-17 DIAGNOSIS — E876 Hypokalemia: Secondary | ICD-10-CM | POA: Diagnosis not present

## 2022-02-17 DIAGNOSIS — I5032 Chronic diastolic (congestive) heart failure: Secondary | ICD-10-CM | POA: Diagnosis not present

## 2022-02-17 DIAGNOSIS — R001 Bradycardia, unspecified: Secondary | ICD-10-CM | POA: Diagnosis not present

## 2022-02-17 HISTORY — PX: REVERSE SHOULDER ARTHROPLASTY: SHX5054

## 2022-02-17 LAB — CBC
HCT: 24.3 % — ABNORMAL LOW (ref 39.0–52.0)
Hemoglobin: 8.2 g/dL — ABNORMAL LOW (ref 13.0–17.0)
MCH: 32.5 pg (ref 26.0–34.0)
MCHC: 33.7 g/dL (ref 30.0–36.0)
MCV: 96.4 fL (ref 80.0–100.0)
Platelets: 145 10*3/uL — ABNORMAL LOW (ref 150–400)
RBC: 2.52 MIL/uL — ABNORMAL LOW (ref 4.22–5.81)
RDW: 12.9 % (ref 11.5–15.5)
WBC: 6 10*3/uL (ref 4.0–10.5)
nRBC: 0.3 % — ABNORMAL HIGH (ref 0.0–0.2)

## 2022-02-17 LAB — COMPREHENSIVE METABOLIC PANEL
ALT: 19 U/L (ref 0–44)
AST: 41 U/L (ref 15–41)
Albumin: 2.9 g/dL — ABNORMAL LOW (ref 3.5–5.0)
Alkaline Phosphatase: 57 U/L (ref 38–126)
Anion gap: 6 (ref 5–15)
BUN: 11 mg/dL (ref 8–23)
CO2: 25 mmol/L (ref 22–32)
Calcium: 8.4 mg/dL — ABNORMAL LOW (ref 8.9–10.3)
Chloride: 108 mmol/L (ref 98–111)
Creatinine, Ser: 0.87 mg/dL (ref 0.61–1.24)
GFR, Estimated: >60 mL/min (ref >60)
Glucose, Bld: 120 mg/dL — ABNORMAL HIGH (ref 70–99)
Potassium: 4.4 mmol/L (ref 3.5–5.1)
Sodium: 139 mmol/L (ref 135–145)
Total Bilirubin: 0.6 mg/dL (ref 0.3–1.2)
Total Protein: 5.1 g/dL — ABNORMAL LOW (ref 6.5–8.1)

## 2022-02-17 LAB — VITAMIN D 25 HYDROXY (VIT D DEFICIENCY, FRACTURES): Vit D, 25-Hydroxy: 39.73 ng/mL (ref 30–100)

## 2022-02-17 LAB — GLUCOSE, CAPILLARY: Glucose-Capillary: 101 mg/dL — ABNORMAL HIGH (ref 70–99)

## 2022-02-17 SURGERY — ARTHROPLASTY, SHOULDER, TOTAL, REVERSE
Anesthesia: General | Site: Shoulder | Laterality: Left

## 2022-02-17 MED ORDER — BUPIVACAINE LIPOSOME 1.3 % IJ SUSP
INTRAMUSCULAR | Status: DC | PRN
  Administered 2022-02-17: 10 mL via PERINEURAL

## 2022-02-17 MED ORDER — EPHEDRINE SULFATE-NACL 50-0.9 MG/10ML-% IV SOSY
PREFILLED_SYRINGE | INTRAVENOUS | Status: DC | PRN
  Administered 2022-02-17: 5 mg via INTRAVENOUS

## 2022-02-17 MED ORDER — VANCOMYCIN HCL 1000 MG IV SOLR
INTRAVENOUS | Status: DC | PRN
  Administered 2022-02-17: 1000 mg via TOPICAL

## 2022-02-17 MED ORDER — CHLORHEXIDINE GLUCONATE 0.12 % MT SOLN
OROMUCOSAL | Status: AC
  Administered 2022-02-17: 15 mL via OROMUCOSAL
  Filled 2022-02-17: qty 15

## 2022-02-17 MED ORDER — ORAL CARE MOUTH RINSE: 15.0000 mL | Freq: Once | OROMUCOSAL | Status: AC

## 2022-02-17 MED ORDER — OXYCODONE HCL 5 MG PO TABS: 10.0000 mg | ORAL_TABLET | ORAL | Status: DC | PRN

## 2022-02-17 MED ORDER — POLYETHYLENE GLYCOL 3350 17 G PO PACK: 17.0000 g | PACK | Freq: Every day | ORAL | Status: DC | PRN

## 2022-02-17 MED ORDER — ONDANSETRON HCL 4 MG PO TABS: 4.0000 mg | ORAL_TABLET | Freq: Four times a day (QID) | ORAL | Status: DC | PRN

## 2022-02-17 MED ORDER — FENTANYL CITRATE (PF) 100 MCG/2ML IJ SOLN: 25.0000 ug | INTRAMUSCULAR | Status: DC | PRN

## 2022-02-17 MED ORDER — CHLORHEXIDINE GLUCONATE 0.12 % MT SOLN: 15.0000 mL | Freq: Once | OROMUCOSAL | Status: AC

## 2022-02-17 MED ORDER — PHENOL 1.4 % MT LIQD: 1.0000 | OROMUCOSAL | Status: DC | PRN

## 2022-02-17 MED ORDER — HYDROMORPHONE HCL 1 MG/ML IJ SOLN: 0.5000 mg | INTRAMUSCULAR | Status: DC | PRN

## 2022-02-17 MED ORDER — FENTANYL CITRATE (PF) 250 MCG/5ML IJ SOLN
INTRAMUSCULAR | Status: AC
  Filled 2022-02-17: qty 5

## 2022-02-17 MED ORDER — FENTANYL CITRATE (PF) 100 MCG/2ML IJ SOLN
INTRAMUSCULAR | Status: AC
  Administered 2022-02-17: 100 ug via INTRAVENOUS
  Filled 2022-02-17: qty 2

## 2022-02-17 MED ORDER — VANCOMYCIN HCL 1000 MG IV SOLR
INTRAVENOUS | Status: AC
  Filled 2022-02-17: qty 20

## 2022-02-17 MED ORDER — CEFAZOLIN SODIUM-DEXTROSE 2-4 GM/100ML-% IV SOLN
2.0000 g | INTRAVENOUS | Status: AC
  Administered 2022-02-17: 2 g via INTRAVENOUS
  Filled 2022-02-17: qty 100

## 2022-02-17 MED ORDER — LIDOCAINE 2% (20 MG/ML) 5 ML SYRINGE
INTRAMUSCULAR | Status: DC | PRN
  Administered 2022-02-17: 30 mg via INTRAVENOUS

## 2022-02-17 MED ORDER — DEXAMETHASONE SODIUM PHOSPHATE 10 MG/ML IJ SOLN
INTRAMUSCULAR | Status: DC | PRN
  Administered 2022-02-17: 10 mg via INTRAVENOUS

## 2022-02-17 MED ORDER — PROPOFOL 10 MG/ML IV BOLUS
INTRAVENOUS | Status: DC | PRN
  Administered 2022-02-17: 140 mg via INTRAVENOUS

## 2022-02-17 MED ORDER — MENTHOL 3 MG MT LOZG: 1.0000 | LOZENGE | OROMUCOSAL | Status: DC | PRN

## 2022-02-17 MED ORDER — OXYCODONE HCL 5 MG PO TABS
5.0000 mg | ORAL_TABLET | ORAL | Status: DC | PRN
  Administered 2022-02-18: 5 mg via ORAL
  Administered 2022-02-18: 10 mg via ORAL
  Filled 2022-02-17: qty 2
  Filled 2022-02-17: qty 1

## 2022-02-17 MED ORDER — FENTANYL CITRATE (PF) 100 MCG/2ML IJ SOLN: 100.0000 ug | Freq: Once | INTRAMUSCULAR | Status: AC

## 2022-02-17 MED ORDER — LIDOCAINE 2% (20 MG/ML) 5 ML SYRINGE
INTRAMUSCULAR | Status: AC
  Filled 2022-02-17: qty 5

## 2022-02-17 MED ORDER — CELECOXIB 200 MG PO CAPS
200.0000 mg | ORAL_CAPSULE | Freq: Two times a day (BID) | ORAL | Status: DC
  Administered 2022-02-17 – 2022-02-19 (×4): 200 mg via ORAL
  Filled 2022-02-17 (×5): qty 1

## 2022-02-17 MED ORDER — BISACODYL 10 MG RE SUPP: 10.0000 mg | Freq: Every day | RECTAL | Status: DC | PRN

## 2022-02-17 MED ORDER — METHOCARBAMOL 500 MG PO TABS
500.0000 mg | ORAL_TABLET | Freq: Four times a day (QID) | ORAL | Status: DC | PRN
  Administered 2022-02-18: 500 mg via ORAL
  Filled 2022-02-17: qty 1

## 2022-02-17 MED ORDER — DEXAMETHASONE SODIUM PHOSPHATE 10 MG/ML IJ SOLN
INTRAMUSCULAR | Status: AC
  Filled 2022-02-17: qty 1

## 2022-02-17 MED ORDER — DIPHENHYDRAMINE HCL 12.5 MG/5ML PO ELIX: 12.5000 mg | ORAL_SOLUTION | ORAL | Status: DC | PRN

## 2022-02-17 MED ORDER — ZOLPIDEM TARTRATE 5 MG PO TABS: 5.0000 mg | ORAL_TABLET | Freq: Every evening | ORAL | Status: DC | PRN

## 2022-02-17 MED ORDER — SUGAMMADEX SODIUM 200 MG/2ML IV SOLN
INTRAVENOUS | Status: DC | PRN
  Administered 2022-02-17: 200 mg via INTRAVENOUS

## 2022-02-17 MED ORDER — ENOXAPARIN SODIUM 40 MG/0.4ML IJ SOSY
40.0000 mg | PREFILLED_SYRINGE | INTRAMUSCULAR | Status: DC
  Administered 2022-02-18 – 2022-02-19 (×2): 40 mg via SUBCUTANEOUS
  Filled 2022-02-17 (×2): qty 0.4

## 2022-02-17 MED ORDER — ONDANSETRON HCL 4 MG/2ML IJ SOLN
INTRAMUSCULAR | Status: DC | PRN
  Administered 2022-02-17: 4 mg via INTRAVENOUS

## 2022-02-17 MED ORDER — CEFAZOLIN SODIUM-DEXTROSE 2-4 GM/100ML-% IV SOLN
INTRAVENOUS | Status: AC
  Filled 2022-02-17: qty 100

## 2022-02-17 MED ORDER — ROCURONIUM BROMIDE 10 MG/ML (PF) SYRINGE
PREFILLED_SYRINGE | INTRAVENOUS | Status: AC
  Filled 2022-02-17: qty 10

## 2022-02-17 MED ORDER — METOCLOPRAMIDE HCL 5 MG PO TABS: 5.0000 mg | ORAL_TABLET | Freq: Three times a day (TID) | ORAL | Status: DC | PRN

## 2022-02-17 MED ORDER — ONDANSETRON HCL 4 MG/2ML IJ SOLN: 4.0000 mg | Freq: Four times a day (QID) | INTRAMUSCULAR | Status: DC | PRN

## 2022-02-17 MED ORDER — CEFAZOLIN SODIUM-DEXTROSE 2-4 GM/100ML-% IV SOLN
2.0000 g | Freq: Four times a day (QID) | INTRAVENOUS | Status: AC
  Administered 2022-02-17 – 2022-02-18 (×3): 2 g via INTRAVENOUS
  Filled 2022-02-17 (×2): qty 100

## 2022-02-17 MED ORDER — METHOCARBAMOL 1000 MG/10ML IJ SOLN: 500.0000 mg | Freq: Four times a day (QID) | INTRAVENOUS | Status: DC | PRN

## 2022-02-17 MED ORDER — MIDAZOLAM HCL 2 MG/2ML IJ SOLN: 2.0000 mg | Freq: Once | INTRAMUSCULAR | Status: AC

## 2022-02-17 MED ORDER — PROPOFOL 10 MG/ML IV BOLUS
INTRAVENOUS | Status: AC
  Filled 2022-02-17: qty 20

## 2022-02-17 MED ORDER — MAGNESIUM CITRATE PO SOLN: 1.0000 | Freq: Once | ORAL | Status: DC | PRN

## 2022-02-17 MED ORDER — ACETAMINOPHEN 500 MG PO TABS
1000.0000 mg | ORAL_TABLET | Freq: Three times a day (TID) | ORAL | Status: DC
  Administered 2022-02-17 – 2022-02-19 (×6): 1000 mg via ORAL
  Filled 2022-02-17 (×6): qty 2

## 2022-02-17 MED ORDER — MIDAZOLAM HCL 2 MG/2ML IJ SOLN
INTRAMUSCULAR | Status: AC
  Administered 2022-02-17: 2 mg via INTRAVENOUS
  Filled 2022-02-17: qty 2

## 2022-02-17 MED ORDER — TRANEXAMIC ACID-NACL 1000-0.7 MG/100ML-% IV SOLN
1000.0000 mg | INTRAVENOUS | Status: AC
  Administered 2022-02-17: 1000 mg via INTRAVENOUS
  Filled 2022-02-17: qty 100

## 2022-02-17 MED ORDER — BUPIVACAINE HCL (PF) 0.5 % IJ SOLN
INTRAMUSCULAR | Status: DC | PRN
  Administered 2022-02-17: 15 mL via PERINEURAL

## 2022-02-17 MED ORDER — DOCUSATE SODIUM 100 MG PO CAPS
100.0000 mg | ORAL_CAPSULE | Freq: Two times a day (BID) | ORAL | Status: DC
  Administered 2022-02-17 – 2022-02-19 (×4): 100 mg via ORAL
  Filled 2022-02-17 (×4): qty 1

## 2022-02-17 MED ORDER — PHENYLEPHRINE HCL-NACL 20-0.9 MG/250ML-% IV SOLN
INTRAVENOUS | Status: DC | PRN
  Administered 2022-02-17: 20 ug/min via INTRAVENOUS

## 2022-02-17 MED ORDER — ROCURONIUM BROMIDE 10 MG/ML (PF) SYRINGE
PREFILLED_SYRINGE | INTRAVENOUS | Status: DC | PRN
  Administered 2022-02-17: 60 mg via INTRAVENOUS

## 2022-02-17 MED ORDER — LACTATED RINGERS IV SOLN: INTRAVENOUS | Status: DC

## 2022-02-17 MED ORDER — METOCLOPRAMIDE HCL 5 MG/ML IJ SOLN: 5.0000 mg | Freq: Three times a day (TID) | INTRAMUSCULAR | Status: DC | PRN

## 2022-02-17 MED ORDER — SODIUM CHLORIDE 0.9 % IR SOLN
Status: DC | PRN
  Administered 2022-02-17: 1000 mL

## 2022-02-17 MED ORDER — 0.9 % SODIUM CHLORIDE (POUR BTL) OPTIME
TOPICAL | Status: DC | PRN
  Administered 2022-02-17: 1000 mL

## 2022-02-17 MED ORDER — ONDANSETRON HCL 4 MG/2ML IJ SOLN
INTRAMUSCULAR | Status: AC
  Filled 2022-02-17: qty 4

## 2022-02-17 SURGICAL SUPPLY — 71 items
BAG COUNTER SPONGE SURGICOUNT (BAG) ×1 IMPLANT
BASEPLATE GLENOID RSA 3X25 0D (Shoulder) IMPLANT
BIT DRILL 3.2 PERIPHERAL SCREW (BIT) IMPLANT
BLADE SAW SGTL 73X25 THK (BLADE) ×1 IMPLANT
CHLORAPREP W/TINT 26 (MISCELLANEOUS) ×2 IMPLANT
CLSR STERI-STRIP ANTIMIC 1/2X4 (GAUZE/BANDAGES/DRESSINGS) IMPLANT
COVER SURGICAL LIGHT HANDLE (MISCELLANEOUS) ×1 IMPLANT
CUP HUM REV SHLD 3/4 39 +3 (Cup) IMPLANT
DRAPE C-ARM 42X72 X-RAY (DRAPES) IMPLANT
DRAPE HALF SHEET 40X57 (DRAPES) ×1 IMPLANT
DRAPE INCISE IOBAN 66X45 STRL (DRAPES) ×2 IMPLANT
DRAPE ORTHO SPLIT 77X108 STRL (DRAPES) ×2
DRAPE SURG ORHT 6 SPLT 77X108 (DRAPES) ×2 IMPLANT
DRAPE SWITCH (DRAPES) ×1 IMPLANT
DRAPE U-SHAPE 47X51 STRL (DRAPES) IMPLANT
DRSG AQUACEL AG ADV 3.5X 6 (GAUZE/BANDAGES/DRESSINGS) ×1 IMPLANT
DRSG AQUACEL AG ADV 3.5X10 (GAUZE/BANDAGES/DRESSINGS) IMPLANT
ELECT BLADE 4.0 EZ CLEAN MEGAD (MISCELLANEOUS) ×1
ELECT REM PT RETURN 9FT ADLT (ELECTROSURGICAL) ×1
ELECTRODE BLDE 4.0 EZ CLN MEGD (MISCELLANEOUS) IMPLANT
ELECTRODE REM PT RTRN 9FT ADLT (ELECTROSURGICAL) ×1 IMPLANT
GLENOSPHERE PERFORM 39 3 (Shoulder) ×1 IMPLANT
GLOVE BIO SURGEON STRL SZ 6.5 (GLOVE) ×1 IMPLANT
GLOVE BIOGEL PI IND STRL 8 (GLOVE) ×1 IMPLANT
GLOVE ECLIPSE 8.0 STRL XLNG CF (GLOVE) ×2 IMPLANT
GLOVE INDICATOR 6.5 STRL GRN (GLOVE) ×1 IMPLANT
GOWN STRL REUS W/ TWL LRG LVL3 (GOWN DISPOSABLE) ×1 IMPLANT
GOWN STRL REUS W/ TWL XL LVL3 (GOWN DISPOSABLE) IMPLANT
GOWN STRL REUS W/TWL LRG LVL3 (GOWN DISPOSABLE) ×1
GOWN STRL REUS W/TWL XL LVL3 (GOWN DISPOSABLE)
GUIDE PIN 3X75 SHOULDER (PIN) ×1
GUIDEWIRE GLENOID 2.5X220 (WIRE) IMPLANT
HANDPIECE INTERPULSE COAX TIP (DISPOSABLE) ×1
KIT BASIN OR (CUSTOM PROCEDURE TRAY) ×1 IMPLANT
KIT STABILIZATION SHOULDER (MISCELLANEOUS) ×1 IMPLANT
KIT TURNOVER KIT B (KITS) ×1 IMPLANT
MANIFOLD NEPTUNE II (INSTRUMENTS) ×1 IMPLANT
NDL HYPO 25GX1X1/2 BEV (NEEDLE) IMPLANT
NDL MAYO TROCAR (NEEDLE) IMPLANT
NEEDLE HYPO 25GX1X1/2 BEV (NEEDLE) IMPLANT
NEEDLE MAYO TROCAR (NEEDLE) IMPLANT
NS IRRIG 1000ML POUR BTL (IV SOLUTION) ×1 IMPLANT
PACK SHOULDER (CUSTOM PROCEDURE TRAY) ×1 IMPLANT
PAD ARMBOARD 7.5X6 YLW CONV (MISCELLANEOUS) ×2 IMPLANT
PIN GUIDE 3X75 SHOULDER (PIN) IMPLANT
RESTRAINT HEAD UNIVERSAL NS (MISCELLANEOUS) ×1 IMPLANT
SCREW 5.5X22 (Screw) IMPLANT
SCREW BONE THREAD 6.5X35 (Screw) IMPLANT
SCREW PERIPHERAL 42 (Screw) IMPLANT
SET HNDPC FAN SPRY TIP SCT (DISPOSABLE) ×1 IMPLANT
SLING ARM IMMOBILIZER LRG (SOFTGOODS) ×1 IMPLANT
SPHERE GLENOD +3X39XLATERALIZE (Shoulder) IMPLANT
SPHR GLND +3X39XLATERALIZE (Shoulder) ×1 IMPLANT
SPONGE T-LAP 18X18 ~~LOC~~+RFID (SPONGE) ×1 IMPLANT
STEM HUMERAL STD SHORT SZ3 (Joint) IMPLANT
STRIP CLOSURE SKIN 1/2X4 (GAUZE/BANDAGES/DRESSINGS) ×1 IMPLANT
SUCTION FRAZIER HANDLE 10FR (MISCELLANEOUS)
SUCTION TUBE FRAZIER 10FR DISP (MISCELLANEOUS) IMPLANT
SUT ETHIBOND 2 V 37 (SUTURE) ×1 IMPLANT
SUT ETHIBOND NAB CT1 #1 30IN (SUTURE) ×1 IMPLANT
SUT FIBERWIRE #5 38 CONV NDL (SUTURE) ×4
SUT MNCRL AB 3-0 PS2 18 (SUTURE) ×1 IMPLANT
SUT MON AB 4-0 PS1 27 (SUTURE) IMPLANT
SUT VIC AB 2-0 CT1 27 (SUTURE) ×1
SUT VIC AB 2-0 CT1 TAPERPNT 27 (SUTURE) ×1 IMPLANT
SUT VIC AB 3-0 SH 27 (SUTURE) ×1
SUT VIC AB 3-0 SH 27X BRD (SUTURE) IMPLANT
SUTURE FIBERWR #5 38 CONV NDL (SUTURE) ×4 IMPLANT
TOWEL GREEN STERILE (TOWEL DISPOSABLE) ×1 IMPLANT
TRAY FOLEY W/BAG SLVR 14FR (SET/KITS/TRAYS/PACK) IMPLANT
WATER STERILE IRR 1000ML POUR (IV SOLUTION) ×1 IMPLANT
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
## 2022-02-17 NOTE — Anesthesia Postprocedure Evaluation (Signed)
Anesthesia Post Note  Patient: Jared [name]  Procedure(s) Performed: INTRAMEDULLARY (IM) NAIL TIBIAL (Left)     Patient location during evaluation: PACU Anesthesia Type: General Level of consciousness: awake and alert Pain management: pain level controlled Vital Signs Assessment: post-procedure vital signs reviewed and stable Respiratory status: spontaneous breathing, nonlabored ventilation, respiratory function stable and patient connected to nasal cannula oxygen Cardiovascular status: blood pressure returned to baseline and stable Postop Assessment: no apparent nausea or vomiting Anesthetic complications: no   No notable events documented.  Last Vitals:  Vitals:   02/17/22 0425 02/17/22 0432  BP:  (!) 168/84  Pulse:  84  Resp: 20 18  Temp:  36.8 C  SpO2:  100%    Last Pain:  Vitals:   02/17/22 0513  TempSrc:   PainSc: Asleep                 Kamil Mchaffie
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
## 2022-02-17 NOTE — Progress Notes (Signed)
OT Cancellation Note  Patient Details Name: Jared [name] MRN: [id number] DOB: April 10, 1961   Cancelled Treatment:    Reason Eval/Treat Not Completed: Other (comment) (pt with plan to go for L shoulder surgery today, will follow up as appropriate.)  Lynnda Child, OTD, OTR/L Acute Rehab [phone]) [phone] - [place] 02/17/2022, 7:10 AM

## 2022-02-17 NOTE — Anesthesia Preprocedure Evaluation (Addendum)
Anesthesia Evaluation  Patient identified by MRN, date of birth, ID band Patient awake    Reviewed: Allergy & Precautions, Patient's Chart, lab work & pertinent test results  Airway Mallampati: II  TM Distance: >3 FB Neck ROM: Full    Dental no notable dental hx.    Pulmonary former smoker   Pulmonary exam normal        Cardiovascular hypertension, +CHF  + dysrhythmias  Rhythm:Regular Rate:Normal  ECHO: Frequent PVCs, PACs during study. Left ventricular ejection fraction, by estimation, is 60 to 65%. The left ventricle has normal function. There is mild left ventricular hypertrophy. Right ventricular systolic function is normal. The right ventricular size is normal. The mitral valve is normal in structure. Trivial mitral valve regurgitation. The aortic valve is tricuspid. Aortic valve regurgitation is trivial. Aortic valve sclerosis/calcification is present, without any evidence of aortic stenosis. The inferior vena cava is normal in size with greater than 50% respiratory variability, suggesting right atrial pressure of 3 mmHg.   Neuro/Psych  PSYCHIATRIC DISORDERS Anxiety Depression    negative neurological ROS     GI/Hepatic Neg liver ROS,GERD  ,,  Endo/Other  negative endocrine ROS    Renal/GU negative Renal ROS  negative genitourinary   Musculoskeletal negative musculoskeletal ROS (+)    Abdominal Normal abdominal exam  (+)   Peds  Hematology  (+) Blood dyscrasia, anemia   Anesthesia Other Findings Left Shoulder Fx  Reproductive/Obstetrics                             Anesthesia Physical Anesthesia Plan  2ASA:   Anesthesia Plan: General and Regional   Post-op Pain Management: Regional block*, Celebrex PO (pre-op)* and Tylenol PO (pre-op)*   Induction:   PONV Risk Score and Plan: 2 and Ondansetron, Dexamethasone, Midazolam and Treatment may vary due to age or medical  condition  Airway Management Planned: Mask and Oral ETT  Additional Equipment: None  Intra-op Plan:   Post-operative Plan: Extubation in OR  Informed Consent: I have reviewed the patients History and Physical, chart, labs and discussed the procedure including the risks, benefits and alternatives for the proposed anesthesia with the patient or authorized representative who has indicated his/her understanding and acceptance.     Dental advisory given  Plan Discussed with: CRNA  Anesthesia Plan Comments:       Anesthesia Quick Evaluation
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
## 2022-02-17 NOTE — Interval H&P Note (Signed)
All questions answered

## 2022-02-17 NOTE — Anesthesia Procedure Notes (Signed)
Procedure Name: Intubation Date/Time: 02/17/2022 4:16 PM  Performed by: Valda Favia, CRNAPre-anesthesia Checklist: Patient identified, Emergency Drugs available, Suction available and Patient being monitored Patient Re-evaluated:Patient Re-evaluated prior to induction Oxygen Delivery Method: Circle System Utilized Preoxygenation: Pre-oxygenation with 100% oxygen Induction Type: IV induction Ventilation: Mask ventilation without difficulty Laryngoscope Size: Glidescope and 4 Grade View: Grade II Tube type: Oral Tube size: 7.5 mm Number of attempts: 1 Airway Equipment and Method: Stylet and Oral airway Placement Confirmation: ETT inserted through vocal cords under direct vision, positive ETCO2 and breath sounds checked- equal and bilateral Secured at: 22 cm Tube secured with: Tape Dental Injury: Teeth and Oropharynx as per pre-operative assessment
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
## 2022-02-17 NOTE — Anesthesia Preprocedure Evaluation (Signed)
Anesthesia Evaluation  Patient identified by MRN, date of birth, ID band Patient awake    Reviewed: Allergy & Precautions, NPO status , Patient's Chart, lab work & pertinent test results  Airway Mallampati: II  TM Distance: >3 FB Neck ROM: Full    Dental  (+) Dental Advisory Given, Chipped,    Pulmonary neg pulmonary ROS, former smoker,    breath sounds clear to auscultation       Cardiovascular hypertension, Pt. on medications +CHF  + dysrhythmias Atrial Fibrillation  Rhythm:Regular Rate:Normal  TTE 2017 - Left ventricle: The cavity size was mildly dilated. Systolic  function was normal. The estimated ejection fraction was in the  range of 55% to 60%. Wall motion was normal; there were no  regional wall motion abnormalities. Features are consistent with  a pseudonormal left ventricular filling pattern, with concomitant  abnormal relaxation and increased filling pressure (grade 2  diastolic dysfunction).  - Aortic valve: There was mild regurgitation.  - Atrial septum: There was increased thickness of the septum,  consistent with lipomatous hypertrophy.   Neuro/Psych PSYCHIATRIC DISORDERS Anxiety Depression negative neurological ROS     GI/Hepatic PUD, GERD  ,(+)     substance abuse  alcohol use,   Endo/Other  negative endocrine ROS  Renal/GU negative Renal ROS  negative genitourinary   Musculoskeletal negative musculoskeletal ROS (+)   Abdominal   Peds  Hematology  (+) Blood dyscrasia, anemia ,   Anesthesia Other Findings   Reproductive/Obstetrics                             Anesthesia Physical Anesthesia Plan  ASA: 2  Anesthesia Plan: General   Post-op Pain Management:    Induction: Intravenous  PONV Risk Score and Plan: 2 and Ondansetron and Dexamethasone  Airway Management Planned: Oral ETT  Additional Equipment: None  Intra-op Plan:   Post-operative  Plan: Extubation in OR  Informed Consent: I have reviewed the patients History and Physical, chart, labs and discussed the procedure including the risks, benefits and alternatives for the proposed anesthesia with the patient or authorized representative who has indicated his/her understanding and acceptance.     Dental advisory given  Plan Discussed with: CRNA  Anesthesia Plan Comments:         Anesthesia Quick Evaluation

## 2022-02-17 NOTE — Anesthesia Procedure Notes (Signed)
Anesthesia Regional Block: Interscalene brachial plexus block   Pre-Anesthetic Checklist: , timeout performed,  Correct Patient, Correct Site, Correct Laterality,  Correct Procedure, Correct Position, site marked,  Risks and benefits discussed,  Surgical consent,  Pre-op evaluation,  At surgeon's request and post-op pain management  Laterality: Left  Prep: Dura Prep       Needles:  Injection technique: Single-shot  Needle Type: Echogenic Stimulator Needle     Needle Length: 5cm  Needle Gauge: 20     Additional Needles:   Procedures:,,,, ultrasound used (permanent image in chart),,    Narrative:  Start time: 02/17/2022 3:25 PM End time: 02/17/2022 3:29 PM Injection made incrementally with aspirations every 5 mL.  Performed by: Personally  Anesthesiologist: Darral Dash, DO  Additional Notes: Patient identified. Risks/Benefits/Options discussed with patient including but not limited to bleeding, infection, nerve damage, failed block, incomplete pain control. Patient expressed understanding and wished to proceed. All questions were answered. Sterile technique was used throughout the entire procedure. Please see nursing notes for vital signs. Aspirated in 5cc intervals with injection for negative confirmation. Patient was given instructions on fall risk and not to get out of bed. All questions and concerns addressed with instructions to call with any issues or inadequate analgesia.

## 2022-02-17 NOTE — Transfer of Care (Signed)
Immediate Anesthesia Transfer of Care Note  Patient: Martise Waddell  Procedure(s) Performed: REVERSE SHOULDER ARTHROPLASTY (Left: Shoulder)  Patient Location: PACU  Anesthesia Type:GA combined with regional for post-op pain  Level of Consciousness: awake, alert  and oriented  Airway & Oxygen Therapy: Patient Spontanous Breathing and Patient connected to nasal cannula oxygen  Post-op Assessment: Report given to RN and Post -op Vital signs reviewed and stable  Post vital signs: Reviewed and stable  Last Vitals:  Vitals Value Taken Time  BP 147/73 02/17/22 1745  Temp    Pulse 92 02/17/22 1750  Resp 13 02/17/22 1750  SpO2 90 % 02/17/22 1750  Vitals shown include unvalidated device data.  Last Pain:  Vitals:   02/17/22 1429  TempSrc:   PainSc: 0-No pain      Patients Stated Pain Goal: 0 (14/23/95 3202)  Complications: No notable events documented.

## 2022-02-18 DIAGNOSIS — S51012A Laceration without foreign body of left elbow, initial encounter: Secondary | ICD-10-CM | POA: Diagnosis not present

## 2022-02-18 DIAGNOSIS — R001 Bradycardia, unspecified: Secondary | ICD-10-CM | POA: Diagnosis not present

## 2022-02-18 LAB — GLUCOSE, CAPILLARY: Glucose-Capillary: 152 mg/dL — ABNORMAL HIGH (ref 70–99)

## 2022-02-18 LAB — COMPREHENSIVE METABOLIC PANEL
ALT: 20 U/L (ref 0–44)
AST: 33 U/L (ref 15–41)
Albumin: 3 g/dL — ABNORMAL LOW (ref 3.5–5.0)
Alkaline Phosphatase: 56 U/L (ref 38–126)
Anion gap: 16 — ABNORMAL HIGH (ref 5–15)
BUN: 15 mg/dL (ref 8–23)
CO2: 22 mmol/L (ref 22–32)
Calcium: 9.2 mg/dL (ref 8.9–10.3)
Chloride: 101 mmol/L (ref 98–111)
Creatinine, Ser: 0.86 mg/dL (ref 0.61–1.24)
GFR, Estimated: >60 mL/min (ref >60)
Glucose, Bld: 170 mg/dL — ABNORMAL HIGH (ref 70–99)
Potassium: 5.1 mmol/L (ref 3.5–5.1)
Sodium: 139 mmol/L (ref 135–145)
Total Bilirubin: 0.7 mg/dL (ref 0.3–1.2)
Total Protein: 5.5 g/dL — ABNORMAL LOW (ref 6.5–8.1)

## 2022-02-18 LAB — CBC
HCT: 25.2 % — ABNORMAL LOW (ref 39.0–52.0)
Hemoglobin: 8.4 g/dL — ABNORMAL LOW (ref 13.0–17.0)
MCH: 32.2 pg (ref 26.0–34.0)
MCHC: 33.3 g/dL (ref 30.0–36.0)
MCV: 96.6 fL (ref 80.0–100.0)
Platelets: 179 10*3/uL (ref 150–400)
RBC: 2.61 MIL/uL — ABNORMAL LOW (ref 4.22–5.81)
RDW: 12.8 % (ref 11.5–15.5)
WBC: 5.6 10*3/uL (ref 4.0–10.5)
nRBC: 0 % (ref 0.0–0.2)

## 2022-02-18 MED ORDER — LACTATED RINGERS IV SOLN: INTRAVENOUS | Status: AC

## 2022-02-18 NOTE — Progress Notes (Signed)
   ORTHOPAEDIC PROGRESS NOTE  s/p Procedure(s): REVERSE SHOULDER ARTHROPLASTY for left shoulder dislocation and traumatic cuff tear on 02/17/2022  INTRAMEDULLARY (IM) NAIL TIBIAL on 10/20 with Dr. Doreatha Martin  SUBJECTIVE: Nerve block is still working for his left arm. He is starting to get back sensation in the left hand. Left leg continues to feel good. No chest pain. No SOB. No nausea/vomiting. No other complaints.  OBJECTIVE: PE: General: sitting up in hospital bed, NAD LUE: Dressing CDI and sling well fitting,  full and painless ROM throughout hand with DPC of 0.  Axillary nerve sensation/motor altered in setting of block and unable to be fully tested.  Distal motor and sensory altered in setting of block. He is able to wiggle fingers slightly. He endorses distal sensation, but altered in setting of block. Warm well perfused hand.  Left lower extremity: ncisions are clean, dry, intact.  No signs of infection.  No significant tenderness with palpation throughout the lower leg. Endorses sensation to all aspects of the foot.  Neurovascularly intact  Vitals:   02/18/22 0545 02/18/22 0753  BP:  132/87  Pulse:  74  Resp: 18 18  Temp:  98.3 F (36.8 C)  SpO2:  99%     ASSESSMENT: Jared [name] is a 61 y.o. male   Left open tibia fracture s/p I&D and IMN by Dr. Doreatha Martin 02/14/22 REVERSE SHOULDER ARTHROPLASTY for left shoulder dislocation and traumatic cuff tear on 02/17/2022  PLAN: Weightbearing:  - LUE: NWB in sling - LLE: WBAT ROM: - LUE: no ROM shoulder - LLE: Okay for unrestricted knee and ankle range of motion as tolerated Insicional and dressing care:   - LUE: reinforce as needed  - LLE: Okay to leave LLE incisions open to air Orthopedic device(s):   - LUE: sling  - LLE: CAM boot when OOB Showering: LLE incisions may get wet VTE prophylaxis: SCDs, Lovenox Pain control:  1. Tylenol 1000 mg q 8 hours 2. Robaxin 500 mg q 8 hours PRN 3. Oxycodone mg q 4 hours PRN 4.  Dilaudid 0.5 - 1 mg q 4 hours PRN 5. Neurontin 1200 mg daily (900 mg in the morning, 300 mg in the evening) Follow - up plan: 2 weeks after d/c with Dr. Doreatha Martin for suture removal and repeat x-rays - lower extremity 1 week in office with Dr. Griffin Basil - shoulder Dispo: Patient hopeful to discharge home. Will need PT/OT prior to safe discharge home. Likely home health PT/OT as well.  Contact information:  Weekdays 8-5 Dr. Ophelia Charter, Noemi Chapel PA-C, After hours and holidays please check [email or web address] for group call information for [facility]  Noemi Chapel, PA-C 02/18/2022
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
## 2022-02-18 NOTE — Anesthesia Postprocedure Evaluation (Signed)
Anesthesia Post Note  Patient: Jared [name]  Procedure(s) Performed: REVERSE SHOULDER ARTHROPLASTY (Left: Shoulder)     Patient location during evaluation: PACU Anesthesia Type: General and Regional Level of consciousness: awake and alert Pain management: pain level controlled Vital Signs Assessment: post-procedure vital signs reviewed and stable Respiratory status: spontaneous breathing, nonlabored ventilation, respiratory function stable and patient connected to nasal cannula oxygen Cardiovascular status: blood pressure returned to baseline and stable Postop Assessment: no apparent nausea or vomiting Anesthetic complications: no   No notable events documented.  Last Vitals:  Vitals:   02/18/22 0545 02/18/22 0753  BP:  132/87  Pulse:  74  Resp: 18 18  Temp:  36.8 C  SpO2:  99%    Last Pain:  Vitals:   02/18/22 0800  TempSrc:   PainSc: 2                  March Rummage Demara Lover
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
## 2022-02-19 ENCOUNTER — Other Ambulatory Visit (HOSPITAL_COMMUNITY): Payer: Self-pay

## 2022-02-19 DIAGNOSIS — R001 Bradycardia, unspecified: Secondary | ICD-10-CM | POA: Diagnosis not present

## 2022-02-19 LAB — GLUCOSE, CAPILLARY: Glucose-Capillary: 110 mg/dL — ABNORMAL HIGH (ref 70–99)

## 2022-02-19 MED ORDER — GABAPENTIN 300 MG PO CAPS
900.0000 mg | ORAL_CAPSULE | Freq: Every day | ORAL | 0 refills | Status: AC
  Filled 2022-02-19: qty 120, 40d supply, fill #0

## 2022-02-19 MED ORDER — OLMESARTAN MEDOXOMIL 40 MG PO TABS
40.0000 mg | ORAL_TABLET | Freq: Every day | ORAL | 3 refills | Status: DC
  Filled 2022-02-19: qty 90, 90d supply, fill #0

## 2022-02-19 MED ORDER — GABAPENTIN 300 MG PO CAPS
300.0000 mg | ORAL_CAPSULE | Freq: Every evening | ORAL | 0 refills | Status: AC
  Filled 2022-02-19: qty 30, 30d supply, fill #0

## 2022-02-19 MED ORDER — ASPIRIN 325 MG PO TABS
325.0000 mg | ORAL_TABLET | Freq: Every day | ORAL | 0 refills | Status: AC
  Filled 2022-02-19: qty 30, 30d supply, fill #0

## 2022-02-19 MED ORDER — CELECOXIB 100 MG PO CAPS
100.0000 mg | ORAL_CAPSULE | Freq: Two times a day (BID) | ORAL | 0 refills | Status: AC
  Filled 2022-02-19: qty 30, 15d supply, fill #0

## 2022-02-19 MED ORDER — ACETAMINOPHEN 500 MG PO TABS
1000.0000 mg | ORAL_TABLET | Freq: Three times a day (TID) | ORAL | 0 refills | Status: AC
  Filled 2022-02-19: qty 60, 10d supply, fill #0

## 2022-02-19 MED ORDER — OMEPRAZOLE 20 MG PO CPDR
20.0000 mg | DELAYED_RELEASE_CAPSULE | Freq: Every day | ORAL | 0 refills | Status: AC
  Filled 2022-02-19: qty 30, 30d supply, fill #0

## 2022-02-19 MED ORDER — OXYCODONE HCL 5 MG PO TABS
ORAL_TABLET | ORAL | 0 refills | Status: AC
  Filled 2022-02-19: qty 30, 7d supply, fill #0

## 2022-02-19 MED ORDER — METHOCARBAMOL 500 MG PO TABS
500.0000 mg | ORAL_TABLET | Freq: Three times a day (TID) | ORAL | 0 refills | Status: DC | PRN
  Filled 2022-02-19: qty 30, 10d supply, fill #0

## 2022-02-19 NOTE — Progress Notes (Signed)
Patient given discharge instructions, medication list and follow up appointments. All questions answered. IV and tele were removed. Patient has TOC medications and equipment at bedside prior to discharge. Judah Carchi, Bettina Gavia RN
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
## 2022-02-19 NOTE — Progress Notes (Signed)
Mobility Specialist Progress Note:   02/19/22 0942  Mobility  Activity Ambulated with assistance in hallway  Level of Assistance Contact guard assist, steadying assist  Assistive Device Cane  Distance Ambulated (ft) 250 ft  Activity Response Tolerated well  $Mobility charge 1 Mobility   Pre- Mobility:   85 HR During Mobility:  100 HR Post Mobility:    80 HR  Pt received in bed willing to participate in mobility. No complaints of pain. Left in bed with call bell in reach and all needs met.   [facility] Surveyor, mining Chat only

## 2022-02-19 NOTE — TOC Transition Note (Signed)
Transition of Care (TOC) - CM/SW Discharge Note Marvetta Gibbons RN, BSN Transitions of Care Unit 4E- RN Case Manager See Treatment Team for direct phone #    Patient Details  Name: Jared [name] MRN: [id number] Date of Birth: Aug 24, 1960  Transition of Care [facility]) CM/SW Contact:  Dawayne Patricia, RN Phone Number: 02/19/2022, 2:48 PM   Clinical Narrative:    Pt stable for transition home today, Orders placed for HHPT/OT/aide and DME- BSC and cane.  CM in to speak with pt at bedside- discussed [facility] services, pt agreeable. List provided for [facility] choice Per CMS guidelines from [email or web address] website with star ratings (copy placed in shadow chart) - pt voiced that he would like to speak with wife- contact info provided for pt to give this writer a return call with choice once he has discussed with wife.   Pt confirmed he would like ordered DME- choice offered for DME agency- pt voiced he has no preference and agreeable to use in house provider.   Address, phone # and PCP all confirmed in epic , wife to transport home.   DME referral placed in [employer]ute system for [facility] and Cane- once processed DME to be delivered to room prior to discharge.   1245- CM still has not heard from pt regarding HH choice, TC made to wife to discuss. Choice offered and reviewed in network [facility] providers- wife agreeable to referral with [facility]. Wife also indicated that they know someone that is a CNA and asked if they can assist with bathing, which will leave up to patient's preference.   1300- referral called to [facility] with [facility] for HHPT/OT needs- referral has been accepted and Alvis Lemmings will reach out to patient to schedule.  Spoke with pt who is agreeable as well.    Final next level of care: [place] Barriers to Discharge: No Barriers Identified   Patient Goals and CMS Choice Patient states their goals for this hospitalization and ongoing recovery are:: return home CMS Medicare.gov  Compare Post Acute Care list provided to:: Patient Choice offered to / list presented to : Patient, Spouse  Discharge Placement                 Home w/ [facility]      Discharge Plan and Services   Discharge Planning Services: CM Consult Post Acute Care Choice: Durable Medical Equipment, Home Health          DME Arranged: Bedside commode, Cane DME Agency: [employer] Date DME Agency Contacted: 02/19/22 Time DME Agency Contacted: 1000 Representative spoke with at DME Agency: [place] system HH Arranged: PT, OT, Nurse's Aide [place] Agency: [place] Date [place]: 02/19/22 Time [place]: 1300 Representative spoke with at [place]: [place]) Interventions     Readmission Risk Interventions    02/19/2022    2:48 PM  Readmission Risk Prevention Plan  Post Dischage Appt Complete  Medication Screening Complete  Transportation Screening Complete

## 2022-02-20 ENCOUNTER — Encounter (HOSPITAL_COMMUNITY): Payer: Self-pay | Admitting: Orthopaedic Surgery

## 2022-03-03 ENCOUNTER — Telehealth: Payer: Self-pay | Admitting: Cardiovascular Disease

## 2022-03-03 NOTE — Telephone Encounter (Signed)
Pt is c/o about the diagnosis' that are in his chart. He states that he knows that he has AFIB but there are many other dx that he does not remember Dr Lyla Son discussing the following congestive heart failure, hypertension, chronic diastolic heart failure, hypotension and rheumatic heart disease. Tried to discuss with pt and he would like an explanation from his doctor.

## 2022-03-03 NOTE — Telephone Encounter (Signed)
Patient called wanting to speak nurse about his diagnosis.  He states the information is incorrect.  It states that his is in congestive heart failure, he states he is not. It also has other diagnosis that he does not have.

## 2022-03-22 NOTE — Telephone Encounter (Signed)
I last saw the patient in August 2022.  At that time his diagnosis for me was PAF as well as hypertension in addition to some mild ankle swelling.  I did not place those other diagnoses in his medical record.  They must have been placed elsewhere by someone else.

## 2022-03-25 ENCOUNTER — Encounter (HOSPITAL_COMMUNITY): Payer: Self-pay

## 2022-03-25 NOTE — Progress Notes (Addendum)
PCP - Creola Corn Hall,MD Cardiologist - Nicki Guadalajara MD LOV 12-18-21  PPM/ICD -  Device Orders -  Rep Notified -   Chest x-ray - CT chest /abd/pelvis 02-13-22 epic EKG - 02-13-22 epic Stress Test -  ECHO - 02-14-22 epic Cardiac Cath -   Sleep Study -  CPAP -   Fasting Blood Sugar -  Checks Blood Sugar _____ times a day  Blood Thinner Instructions: Aspirin Instructions: 325 mg    ERAS Protcol - PRE-SURGERY Ensure or G2-    COVID vaccine -  Activity--Able to complete ADL's without SOB Anesthesia review: HTN PAF  Patient denies shortness of breath, fever, cough and chest pain at PAT appointment   All instructions explained to the patient, with a verbal understanding of the material. Patient agrees to go over the instructions while at home for a better understanding. Patient also instructed to self quarantine after being tested for COVID-19. The opportunity to ask questions was provided.

## 2022-03-25 NOTE — Patient Instructions (Addendum)
SURGICAL WAITING ROOM VISITATION Patients having surgery or a procedure may have no more than 2 support people in the waiting area - these visitors may rotate.   Children under the age of 15 must have an adult with them who is not the patient. If the patient needs to stay at the hospital during part of their recovery, the visitor guidelines for inpatient rooms apply. Pre-op nurse will coordinate an appropriate time for 1 support person to accompany patient in pre-op.  This support person may not rotate.    Please refer to the [facility] website for the visitor guidelines for Inpatients (after your surgery is over and you are in a regular room).       Your procedure is scheduled on: 04-18-22   Report to [facility] Main Entrance    Report to admitting at      0515 AM   Call this number if you have problems the morning of surgery [phone]   Do not eat food :After Midnight.   After Midnight you may have the following liquids until __0430____ AM DAY OF SURGERY  THEN NOTHING BY MOUTH  Water Non-Citrus Juices (without pulp, NO RED) Carbonated Beverages Black Coffee (NO MILK/CREAM OR CREAMERS, sugar ok)  Clear Tea (NO MILK/CREAM OR CREAMERS, sugar ok) regular and decaf                             Plain Jell-O (NO RED)                                           Fruit ices (not with fruit pulp, NO RED)                                     Popsicles (NO RED)                                                               Sports drinks like Gatorade (NO RED)             FOLLOW  A ADDITIONAL PRE OP INSTRUCTIONS YOU RECEIVED FROM YOUR SURGEON'S OFFICE!!!     Oral Hygiene is also important to reduce your risk of infection.                                    Remember - BRUSH YOUR TEETH THE MORNING OF SURGERY WITH YOUR REGULAR TOOTHPASTE   Do NOT smoke after Midnight   Take these medicines the morning of surgery with A SIP OF WATER: Gabapentin  DO NOT TAKE ANY ORAL DIABETIC  MEDICATIONS DAY OF YOUR SURGERY  Bring CPAP mask and tubing day of surgery.                              You may not have any metal on your body including hair pins, jewelry, and body piercing  Do not wear  lotions, powders, perfumes/cologne, or deodorant                Men may shave face and neck.   Do not bring valuables to the hospital. [place] IS NOT             RESPONSIBLE   FOR VALUABLES.   Contacts, dentures or bridgework may not be worn into surgery.   Bring small overnight bag day of surgery.   DO NOT BRING YOUR HOME MEDICATIONS TO THE HOSPITAL. PHARMACY WILL DISPENSE MEDICATIONS LISTED ON YOUR MEDICATION LIST TO YOU DURING YOUR ADMISSION IN THE HOSPITAL!    Patients discharged on the day of surgery will not be allowed to drive home.  Someone NEEDS to stay with you for the first 24 hours after anesthesia.                Please read over the following fact sheets you were given: IF YOU HAVE QUESTIONS ABOUT YOUR PRE-OP INSTRUCTIONS PLEASE CALL [phone]   If you received a COVID test during your pre-op visit  it is requested that you wear a mask when out in public, stay away from anyone that may not be feeling well and notify your surgeon if you develop symptoms. If you test positive for Covid or have been in contact with anyone that has tested positive in the last 10 days please notify you surgeon.     - Preparing for Surgery Before surgery, you can play an important role.  Because skin is not sterile, your skin needs to be as free of germs as possible.  You can reduce the number of germs on your skin by washing with CHG (chlorahexidine gluconate) soap before surgery.  CHG is an antiseptic cleaner which kills germs and bonds with the skin to continue killing germs even after washing. Please DO NOT use if you have an allergy to CHG or antibacterial soaps.  If your skin becomes reddened/irritated stop using the CHG and inform your nurse when you  arrive at Short Stay. Do not shave (including legs and underarms) for at least 48 hours prior to the first CHG shower.  You may shave your face/neck. Please follow these instructions carefully:  1.  Shower with CHG Soap the night before surgery and the  morning of Surgery.  2.  If you choose to wash your hair, wash your hair first as usual with your  normal  shampoo.  3.  After you shampoo, rinse your hair and body thoroughly to remove the  shampoo.                           4.  Use CHG as you would any other liquid soap.  You can apply chg directly  to the skin and wash                       Gently with a scrungie or clean washcloth.  5.  Apply the CHG Soap to your body ONLY FROM THE NECK DOWN.   Do not use on face/ open                           Wound or open sores. Avoid contact with eyes, ears mouth and genitals (private parts).  Wash face,  Genitals (private parts) with your normal soap.             6.  Wash thoroughly, paying special attention to the area where your surgery  will be performed.  7.  Thoroughly rinse your body with warm water from the neck down.  8.  DO NOT shower/wash with your normal soap after using and rinsing off  the CHG Soap.                9.  Pat yourself dry with a clean towel.            10.  Wear clean pajamas.            11.  Place clean sheets on your bed the night of your first shower and do not  sleep with pets. Day of Surgery : Do not apply any lotions/deodorants the morning of surgery.  Please wear clean clothes to the hospital/surgery center.  FAILURE TO FOLLOW THESE INSTRUCTIONS MAY RESULT IN THE CANCELLATION OF YOUR SURGERY PATIENT SIGNATURE_________________________________  NURSE SIGNATURE__________________________________  ________________________________________________________________________

## 2022-03-26 NOTE — Telephone Encounter (Signed)
See my chart message

## 2022-03-26 NOTE — Telephone Encounter (Signed)
Called pt back, he was wanting to discuss further the conversation we had on 03-03-22 about removing diagnosises in his chart that were added while he was in the hospital [facility]. Discussed Dr Landry Dyke comments as well and having to go to the hospital to have these diagnosises removed. Informed that they may need him to ' sign a release to do this'. Verbalized understanding. He will go to the hospital and let us know if we need to do anything further.

## 2022-03-31 ENCOUNTER — Encounter (HOSPITAL_COMMUNITY): Payer: Self-pay

## 2022-03-31 ENCOUNTER — Other Ambulatory Visit: Payer: Self-pay

## 2022-03-31 ENCOUNTER — Encounter (HOSPITAL_COMMUNITY)
Admission: RE | Admit: 2022-03-31 | Discharge: 2022-03-31 | Disposition: A | Discharge status: Home / Self Care | Payer: BC Managed Care – PPO | Source: Ambulatory Visit | Attending: Surgery | Admitting: Surgery

## 2022-03-31 VITALS — BP 139/74 | HR 70 | Temp 98.4°F | Resp 16 | Ht 70.0 in | Wt 186.0 lb

## 2022-03-31 DIAGNOSIS — Z01812 Encounter for preprocedural laboratory examination: Secondary | ICD-10-CM | POA: Diagnosis present

## 2022-03-31 DIAGNOSIS — I10 Essential (primary) hypertension: Secondary | ICD-10-CM | POA: Insufficient documentation

## 2022-03-31 HISTORY — DX: Unspecified osteoarthritis, unspecified site: M19.90

## 2022-03-31 HISTORY — DX: Cardiac arrhythmia, unspecified: I49.9

## 2022-03-31 LAB — CBC
HCT: 37.6 % — ABNORMAL LOW (ref 39.0–52.0)
Hemoglobin: 12.2 g/dL — ABNORMAL LOW (ref 13.0–17.0)
MCH: 31.6 pg (ref 26.0–34.0)
MCHC: 32.4 g/dL (ref 30.0–36.0)
MCV: 97.4 fL (ref 80.0–100.0)
Platelets: 321 10*3/uL (ref 150–400)
RBC: 3.86 MIL/uL — ABNORMAL LOW (ref 4.22–5.81)
RDW: 13.2 % (ref 11.5–15.5)
WBC: 6.3 10*3/uL (ref 4.0–10.5)
nRBC: 0 % (ref 0.0–0.2)

## 2022-03-31 LAB — BASIC METABOLIC PANEL
Anion gap: 8 (ref 5–15)
BUN: 16 mg/dL (ref 8–23)
CO2: 28 mmol/L (ref 22–32)
Calcium: 9.6 mg/dL (ref 8.9–10.3)
Chloride: 104 mmol/L (ref 98–111)
Creatinine, Ser: 0.83 mg/dL (ref 0.61–1.24)
GFR, Estimated: >60 mL/min (ref >60)
Glucose, Bld: 101 mg/dL — ABNORMAL HIGH (ref 70–99)
Potassium: 3.9 mmol/L (ref 3.5–5.1)
Sodium: 140 mmol/L (ref 135–145)

## 2022-04-02 NOTE — Progress Notes (Signed)
Patient phoned to let presurgical testing know that he is to hold Aspirin 325 seven days before surgery per his cardiologist.

## 2022-04-17 NOTE — H&P (Signed)
Alwin Lanigan [id number]    Referring Provider:  Self     Subjective    Chief Complaint: New Consultation (Hernia )       History of Present Illness: 61 year old male following up regarding concern for inguinal hernias.  I last saw him in May.  He is known to me following emergent laparoscopic appendectomy at the end of February for gangrenous appendicitis without perforation.  He has noted ongoing swelling in the suprapubic region in the area of prior hernia repairs which were done when he was a child.  This initially presented after a 6-week episode of coughing related to bronchitis.  At our last visit he had no pain, and had not noticed any significant protrusion or reducible structure in the groin, no GI or obstructive symptoms although did note that occasionally the right scrotum and testicle felt tight and notes that he has had a hydrocele aspirated in the past many years ago.  On exam noted increased adipose tissue in bilateral suprapubic pubic region but on palpation no obvious hernia with standing nor Valsalva; and on review of his CT that he had when he presented with appendicitis I did see what appears to be a small direct right hernia containing fat but no hernia on the left.  At the time given nothing appreciable on exam and minimal symptoms surgery was not recommended.  He continues to notice intermittent pain and tightness more on the right than the left, and also notes an umbilical hernia now.  He is interested in proceeding with repair.         Review of Systems: A complete review of systems was obtained from the patient.  I have reviewed this information and discussed as appropriate with the patient.  See HPI as well for other ROS.     Medical History:     Past Medical History:  Diagnosis Date   Hypertension        There is no problem list on file for this patient.          Past Surgical History:  Procedure Laterality Date   APPENDECTOMY          No Known  Allergies         Current Outpatient Medications on File Prior to Visit  Medication Sig Dispense Refill   aspirin 81 MG EC tablet Take 81 mg by mouth once daily       cholecalciferol, vitamin D3, (CHOLECALCIFEROL, VIT D3,,BULK,) 100,000 unit/gram Powd Take by mouth       dilTIAZem (TIAZAC) 240 MG ER capsule TAKE ONE CAPSULE EACH DAY       hydroCHLOROthiazide (HYDRODIURIL) 25 MG tablet Take by mouth       magnesium 250 mg Tab Take by mouth       multivitamin with minerals tablet Take by mouth once daily       olmesartan (BENICAR) 40 MG tablet         potassium gluconate 2.5 mEq Tab Take by mouth        No current facility-administered medications on file prior to visit.           Family History  Problem Relation Age of Onset   Skin cancer Mother     High blood pressure (Hypertension) Mother     Colon cancer Father     Coronary Artery Disease (Blocked arteries around heart) Father        Social History        Tobacco Use  Smoking Status Former   Types: Cigarettes  Smokeless Tobacco Never      Social History         Socioeconomic History   Marital status: Married  Tobacco Use   Smoking status: Former      Types: Cigarettes   Smokeless tobacco: Never  Substance and Sexual Activity   Alcohol use: Never   Drug use: Never      Objective:         Vitals:    01/22/22 1005  BP: 130/80  Pulse: 73  Temp: 36.7 C (98 F)  SpO2: 96%  Weight: 89.8 kg (198 lb)  Height: 177.8 cm (5\' 10" )    Body mass index is 28.41 kg/m.   Alert and well-appearing Unlabored respirations Abdomen is soft, nontender, nondistended.  Partially reducible umbilical hernia containing fat, fascial defect not palpable.  Laxity versus vaguely palpable right greater than left inguinal hernias.   Assessment and Plan:  Diagnoses and all orders for this visit:   Inguinal hernia without obstruction or gangrene, recurrence not specified, unspecified laterality   Umbilical hernia without  obstruction and without gangrene     We discussed proceeding with minimally invasive repair, and I went over the relevant anatomy and surgical technique with him as well as risks of bleeding, infection, pain, scarring, injury to intra-abdominal or retroperitoneal structures, hernia recurrence, chronic pain, urinary retention, etc.  We will plan to repair both sides assuming laparoscopic/robotic examination confirms bilateral inguinal hernias, and we will plan to repair the umbilical hernia in an open fashion.  Questions welcomed and answered, he wishes to proceed towards the end of the year.   Ajayla Iglesias , MD

## 2022-04-17 NOTE — Anesthesia Preprocedure Evaluation (Addendum)
Anesthesia Evaluation  Patient identified by MRN, date of birth, ID band Patient awake    Reviewed: Allergy & Precautions, NPO status , Patient's Chart, lab work & pertinent test results  History of Anesthesia Complications Negative for: history of anesthetic complications  Airway Mallampati: II  TM Distance: >3 FB Neck ROM: Full    Dental no notable dental hx.    Pulmonary former smoker   Pulmonary exam normal        Cardiovascular hypertension, Pt. on medications Normal cardiovascular exam+ dysrhythmias Atrial Fibrillation   Normal TTE   Neuro/Psych    Depression    negative neurological ROS     GI/Hepatic Neg liver ROS, PUD,GERD  Controlled,,  Endo/Other  negative endocrine ROS    Renal/GU negative Renal ROS  negative genitourinary   Musculoskeletal  (+) Arthritis ,    Abdominal   Peds  Hematology negative hematology ROS (+)   Anesthesia Other Findings Day of surgery medications reviewed with patient.  Reproductive/Obstetrics negative OB ROS                              Anesthesia Physical Anesthesia Plan  ASA: 2  Anesthesia Plan:    Post-op Pain Management: Tylenol PO (pre-op)* and Ketamine IV*   Induction: Intravenous  PONV Risk Score and Plan: 2 and Midazolam, Dexamethasone and Ondansetron  Airway Management Planned: Oral ETT  Additional Equipment: None  Intra-op Plan:   Post-operative Plan: Extubation in OR  Informed Consent: I have reviewed the patients History and Physical, chart, labs and discussed the procedure including the risks, benefits and alternatives for the proposed anesthesia with the patient or authorized representative who has indicated his/her understanding and acceptance.     Dental advisory given  Plan Discussed with: CRNA  Anesthesia Plan Comments:          Anesthesia Quick Evaluation

## 2022-04-18 ENCOUNTER — Ambulatory Visit (HOSPITAL_COMMUNITY): Payer: BC Managed Care – PPO | Admitting: Certified Registered"

## 2022-04-18 ENCOUNTER — Other Ambulatory Visit: Payer: Self-pay

## 2022-04-18 ENCOUNTER — Encounter (HOSPITAL_COMMUNITY): Payer: Self-pay | Admitting: Surgery

## 2022-04-18 ENCOUNTER — Encounter (HOSPITAL_COMMUNITY)
Admission: RE | Disposition: A | Discharge status: Home / Self Care | Payer: Self-pay | Source: Home / Self Care | Attending: Surgery

## 2022-04-18 ENCOUNTER — Ambulatory Visit (HOSPITAL_COMMUNITY)
Admission: RE | Admit: 2022-04-18 | Discharge: 2022-04-18 | Disposition: A | Discharge status: Home / Self Care | Payer: BC Managed Care – PPO | Attending: Surgery | Admitting: Surgery

## 2022-04-18 DIAGNOSIS — Z87891 Personal history of nicotine dependence: Secondary | ICD-10-CM | POA: Diagnosis not present

## 2022-04-18 DIAGNOSIS — K219 Gastro-esophageal reflux disease without esophagitis: Secondary | ICD-10-CM | POA: Insufficient documentation

## 2022-04-18 DIAGNOSIS — K429 Umbilical hernia without obstruction or gangrene: Secondary | ICD-10-CM | POA: Diagnosis not present

## 2022-04-18 DIAGNOSIS — I10 Essential (primary) hypertension: Secondary | ICD-10-CM | POA: Diagnosis not present

## 2022-04-18 DIAGNOSIS — Z9049 Acquired absence of other specified parts of digestive tract: Secondary | ICD-10-CM | POA: Insufficient documentation

## 2022-04-18 DIAGNOSIS — K4090 Unilateral inguinal hernia, without obstruction or gangrene, not specified as recurrent: Secondary | ICD-10-CM | POA: Diagnosis not present

## 2022-04-18 HISTORY — PX: UMBILICAL HERNIA REPAIR: SHX196

## 2022-04-18 SURGERY — REPAIR, HERNIA, INGUINAL, BILATERAL, ROBOT-ASSISTED
Anesthesia: General | Site: Abdomen

## 2022-04-18 MED ORDER — HYDROMORPHONE HCL 1 MG/ML IJ SOLN: 0.2500 mg | INTRAMUSCULAR | Status: DC | PRN

## 2022-04-18 MED ORDER — MIDAZOLAM HCL 5 MG/5ML IJ SOLN
INTRAMUSCULAR | Status: DC | PRN
  Administered 2022-04-18: 2 mg via INTRAVENOUS

## 2022-04-18 MED ORDER — SUGAMMADEX SODIUM 200 MG/2ML IV SOLN
INTRAVENOUS | Status: DC | PRN
  Administered 2022-04-18: 400 mg via INTRAVENOUS

## 2022-04-18 MED ORDER — FENTANYL CITRATE (PF) 100 MCG/2ML IJ SOLN
INTRAMUSCULAR | Status: DC | PRN
  Administered 2022-04-18: 100 ug via INTRAVENOUS

## 2022-04-18 MED ORDER — DEXAMETHASONE SODIUM PHOSPHATE 10 MG/ML IJ SOLN
INTRAMUSCULAR | Status: AC
  Filled 2022-04-18: qty 1

## 2022-04-18 MED ORDER — SUCCINYLCHOLINE CHLORIDE 200 MG/10ML IV SOSY
PREFILLED_SYRINGE | INTRAVENOUS | Status: AC
  Filled 2022-04-18: qty 10

## 2022-04-18 MED ORDER — ROCURONIUM BROMIDE 10 MG/ML (PF) SYRINGE
PREFILLED_SYRINGE | INTRAVENOUS | Status: AC
  Filled 2022-04-18: qty 10

## 2022-04-18 MED ORDER — ACETAMINOPHEN 500 MG PO TABS: 1000.0000 mg | ORAL_TABLET | ORAL | Status: DC

## 2022-04-18 MED ORDER — GLYCOPYRROLATE 0.2 MG/ML IJ SOLN
INTRAMUSCULAR | Status: DC | PRN
  Administered 2022-04-18: .2 mg via INTRAVENOUS

## 2022-04-18 MED ORDER — OXYCODONE HCL 5 MG PO CAPS: 5.0000 mg | ORAL_CAPSULE | Freq: Three times a day (TID) | ORAL | 0 refills | Status: AC | PRN

## 2022-04-18 MED ORDER — ORAL CARE MOUTH RINSE: 15.0000 mL | Freq: Once | OROMUCOSAL | Status: AC

## 2022-04-18 MED ORDER — ONDANSETRON HCL 4 MG/2ML IJ SOLN
INTRAMUSCULAR | Status: AC
  Filled 2022-04-18: qty 2

## 2022-04-18 MED ORDER — BUPIVACAINE HCL 0.25 % IJ SOLN
INTRAMUSCULAR | Status: AC
  Filled 2022-04-18: qty 1

## 2022-04-18 MED ORDER — PHENYLEPHRINE HCL (PRESSORS) 10 MG/ML IV SOLN
INTRAVENOUS | Status: AC
  Filled 2022-04-18: qty 1

## 2022-04-18 MED ORDER — CEFAZOLIN SODIUM-DEXTROSE 2-4 GM/100ML-% IV SOLN
2.0000 g | INTRAVENOUS | Status: AC
  Administered 2022-04-18: 2 g via INTRAVENOUS
  Filled 2022-04-18: qty 100

## 2022-04-18 MED ORDER — LIDOCAINE HCL (PF) 2 % IJ SOLN
INTRAMUSCULAR | Status: AC
  Filled 2022-04-18: qty 20

## 2022-04-18 MED ORDER — STERILE WATER FOR IRRIGATION IR SOLN
Status: DC | PRN
  Administered 2022-04-18: 1000 mL

## 2022-04-18 MED ORDER — PROPOFOL 10 MG/ML IV BOLUS
INTRAVENOUS | Status: DC | PRN
  Administered 2022-04-18: 160 mg via INTRAVENOUS

## 2022-04-18 MED ORDER — PROPOFOL 10 MG/ML IV BOLUS
INTRAVENOUS | Status: AC
  Filled 2022-04-18: qty 20

## 2022-04-18 MED ORDER — BUPIVACAINE LIPOSOME 1.3 % IJ SUSP
INTRAMUSCULAR | Status: AC
  Filled 2022-04-18: qty 20

## 2022-04-18 MED ORDER — ROCURONIUM BROMIDE 10 MG/ML (PF) SYRINGE
PREFILLED_SYRINGE | INTRAVENOUS | Status: DC | PRN
  Administered 2022-04-18: 30 mg via INTRAVENOUS
  Administered 2022-04-18: 60 mg via INTRAVENOUS
  Administered 2022-04-18: 40 mg via INTRAVENOUS

## 2022-04-18 MED ORDER — LACTATED RINGERS IV SOLN: INTRAVENOUS | Status: DC | PRN

## 2022-04-18 MED ORDER — MIDAZOLAM HCL 2 MG/2ML IJ SOLN
INTRAMUSCULAR | Status: AC
  Filled 2022-04-18: qty 2

## 2022-04-18 MED ORDER — KETAMINE HCL 10 MG/ML IJ SOLN
INTRAMUSCULAR | Status: DC | PRN
  Administered 2022-04-18: 30 mg via INTRAVENOUS

## 2022-04-18 MED ORDER — CHLORHEXIDINE GLUCONATE 0.12 % MT SOLN
15.0000 mL | Freq: Once | OROMUCOSAL | Status: AC
  Administered 2022-04-18: 15 mL via OROMUCOSAL

## 2022-04-18 MED ORDER — ACETAMINOPHEN 500 MG PO TABS
1000.0000 mg | ORAL_TABLET | Freq: Once | ORAL | Status: AC
  Administered 2022-04-18: 1000 mg via ORAL
  Filled 2022-04-18: qty 2

## 2022-04-18 MED ORDER — DEXAMETHASONE SODIUM PHOSPHATE 10 MG/ML IJ SOLN
INTRAMUSCULAR | Status: DC | PRN
  Administered 2022-04-18: 8 mg via INTRAVENOUS

## 2022-04-18 MED ORDER — PHENYLEPHRINE HCL-NACL 20-0.9 MG/250ML-% IV SOLN
INTRAVENOUS | Status: DC | PRN
  Administered 2022-04-18: 50 ug/min via INTRAVENOUS

## 2022-04-18 MED ORDER — KETAMINE HCL 50 MG/5ML IJ SOSY
PREFILLED_SYRINGE | INTRAMUSCULAR | Status: AC
  Filled 2022-04-18: qty 5

## 2022-04-18 MED ORDER — FENTANYL CITRATE (PF) 250 MCG/5ML IJ SOLN
INTRAMUSCULAR | Status: AC
  Filled 2022-04-18: qty 5

## 2022-04-18 MED ORDER — BUPIVACAINE HCL (PF) 0.25 % IJ SOLN
INTRAMUSCULAR | Status: DC | PRN
  Administered 2022-04-18: 30 mL

## 2022-04-18 MED ORDER — AMISULPRIDE (ANTIEMETIC) 5 MG/2ML IV SOLN: 10.0000 mg | Freq: Once | INTRAVENOUS | Status: DC | PRN

## 2022-04-18 MED ORDER — CHLORHEXIDINE GLUCONATE 4 % EX LIQD: 60.0000 mL | Freq: Once | CUTANEOUS | Status: DC

## 2022-04-18 MED ORDER — LACTATED RINGERS IV SOLN: INTRAVENOUS | Status: DC

## 2022-04-18 MED ORDER — ONDANSETRON HCL 4 MG/2ML IJ SOLN
INTRAMUSCULAR | Status: DC | PRN
  Administered 2022-04-18: 4 mg via INTRAVENOUS

## 2022-04-18 MED ORDER — BUPIVACAINE LIPOSOME 1.3 % IJ SUSP
INTRAMUSCULAR | Status: DC | PRN
  Administered 2022-04-18: 20 mL

## 2022-04-18 MED ORDER — LIDOCAINE 2% (20 MG/ML) 5 ML SYRINGE
INTRAMUSCULAR | Status: DC | PRN
  Administered 2022-04-18: 100 mg via INTRAVENOUS
  Administered 2022-04-18: 1.5 mg/kg/h via INTRAVENOUS

## 2022-04-18 MED ORDER — BUPIVACAINE LIPOSOME 1.3 % IJ SUSP: 20.0000 mL | Freq: Once | INTRAMUSCULAR | Status: DC

## 2022-04-18 MED ORDER — GABAPENTIN 300 MG PO CAPS
300.0000 mg | ORAL_CAPSULE | ORAL | Status: DC
  Filled 2022-04-18: qty 1

## 2022-04-18 SURGICAL SUPPLY — 46 items
ANTIFOG SOL W/FOAM PAD STRL (MISCELLANEOUS) ×2
BENZOIN TINCTURE PRP APPL 2/3 (GAUZE/BANDAGES/DRESSINGS) IMPLANT
BLADE SURG SZ11 CARB STEEL (BLADE) ×2 IMPLANT
CHLORAPREP W/TINT 26 (MISCELLANEOUS) ×2 IMPLANT
COVER SURGICAL LIGHT HANDLE (MISCELLANEOUS) ×2 IMPLANT
COVER TIP SHEARS 8 DVNC (MISCELLANEOUS) ×2 IMPLANT
COVER TIP SHEARS 8MM DA VINCI (MISCELLANEOUS) ×2
DRAPE ARM DVNC X/XI (DISPOSABLE) ×8 IMPLANT
DRAPE COLUMN DVNC XI (DISPOSABLE) ×2 IMPLANT
DRAPE DA VINCI XI ARM (DISPOSABLE) ×8
DRAPE DA VINCI XI COLUMN (DISPOSABLE) ×2
DRAPE LAPAROSCOPIC ABDOMINAL (DRAPES) ×2 IMPLANT
ELECT REM PT RETURN 15FT ADLT (MISCELLANEOUS) ×2 IMPLANT
GAUZE SPONGE 4X4 12PLY STRL (GAUZE/BANDAGES/DRESSINGS) IMPLANT
GLOVE BIO SURGEON STRL SZ 6 (GLOVE) ×4 IMPLANT
GLOVE INDICATOR 6.5 STRL GRN (GLOVE) ×4 IMPLANT
GLOVE SS BIOGEL STRL SZ 6 (GLOVE) ×2 IMPLANT
GOWN STRL REUS W/ TWL LRG LVL3 (GOWN DISPOSABLE) ×4 IMPLANT
GOWN STRL REUS W/TWL LRG LVL3 (GOWN DISPOSABLE) ×4
KIT BASIN OR (CUSTOM PROCEDURE TRAY) ×2 IMPLANT
KIT TURNOVER KIT A (KITS) IMPLANT
MESH 3DMAX MID 4X6 RT LRG (Mesh General) IMPLANT
MESH ULTRAPRO 3X6 7.6X15CM (Mesh General) IMPLANT
MESH VENTRALIGHT ST 6IN CRC (Mesh General) IMPLANT
MESH VENTRALIGHT ST 8IN CRC (Mesh General) IMPLANT
NDL INSUFFLATION 14GA 120MM (NEEDLE) ×2 IMPLANT
NEEDLE HYPO 22GX1.5 SAFETY (NEEDLE) ×2 IMPLANT
NEEDLE INSUFFLATION 14GA 120MM (NEEDLE) ×2 IMPLANT
PACK GENERAL/GYN (CUSTOM PROCEDURE TRAY) ×2 IMPLANT
PAD POSITIONING PINK XL (MISCELLANEOUS) ×2 IMPLANT
SEAL CANN UNIV 5-8 DVNC XI (MISCELLANEOUS) ×6 IMPLANT
SEAL XI 5MM-8MM UNIVERSAL (MISCELLANEOUS) ×6
SOLUTION ANTFG W/FOAM PAD STRL (MISCELLANEOUS) ×2 IMPLANT
SOLUTION ELECTROLUBE (MISCELLANEOUS) IMPLANT
STRIP CLOSURE SKIN 1/2X4 (GAUZE/BANDAGES/DRESSINGS) IMPLANT
SUT ETHIBOND 0 MO6 C/R (SUTURE) IMPLANT
SUT MNCRL AB 4-0 PS2 18 (SUTURE) ×2 IMPLANT
SUT PROLENE 2 0 CT2 30 (SUTURE) IMPLANT
SUT VIC AB 3-0 SH 27 (SUTURE) ×4
SUT VIC AB 3-0 SH 27XBRD (SUTURE) ×2 IMPLANT
SUT VLOC 180 2-0 6IN GS21 (SUTURE) ×2 IMPLANT
SUT VLOC 3-0 9IN GRN (SUTURE) IMPLANT
SYR 10ML LL (SYRINGE) ×2 IMPLANT
SYR 20ML LL LF (SYRINGE) ×2 IMPLANT
TOWEL OR NON WOVEN STRL DISP B (DISPOSABLE) ×2 IMPLANT
TUBING INSUFFLATION 10FT LAP (TUBING) ×2 IMPLANT

## 2022-04-18 NOTE — Discharge Instructions (Signed)
HERNIA REPAIR: POST OP INSTRUCTIONS   EAT Gradually transition to a high fiber diet with a fiber supplement over the next few weeks after discharge.  Start with a pureed / full liquid diet (see below)  WALK Walk an hour a day (cumulative- not all at once).  Control your pain to do that.    CONTROL PAIN Control pain so that you can walk, sleep, tolerate sneezing/coughing, and go up/down stairs.  HAVE A BOWEL MOVEMENT DAILY Keep your bowels regular to avoid problems.  OK to try a laxative to override constipation.  OK to use an antidiarrheal to slow down diarrhea.  Call if not better after 2 tries  CALL IF YOU HAVE PROBLEMS/CONCERNS Call if you are still struggling despite following these instructions. Call if you have concerns not answered by these instructions  ######################################################################    DIET: Follow a light bland diet & liquids the first 24 hours after arrival home, such as soup, liquids, starches, etc.  Be sure to drink plenty of fluids.  Quickly advance to a usual solid diet within a few days.  Avoid fast food or heavy meals initially as you are more likely to get nauseated or have irregular bowels.  A low-sugar, high-fiber diet for the rest of your life is ideal.   Take your usually prescribed home medications unless otherwise directed.  PAIN CONTROL: Pain is best controlled by a usual combination of three different methods TOGETHER: Ice/Heat Over the counter pain medication Prescription pain medication Most patients will experience some swelling and bruising around the hernia(s) such as the bellybutton, groins, or old incisions.  Ice packs or heating pads (30-60 minutes up to 6 times a day) will help. Use ice for the first few days to help decrease swelling and bruising, then switch to heat to help relax tight/sore spots and speed recovery.  Some people prefer to use ice alone, heat alone, alternating between ice & heat.  Experiment  to what works for you.  Swelling and bruising can take several weeks to resolve.   It is helpful to take an over-the-counter pain medication regularly for the first days: Naproxen (Aleve, etc)  Two 220mg tabs twice a day OR Ibuprofen (Advil, etc) Three 200mg tabs four times a day (every meal & bedtime) AND Acetaminophen (Tylenol, etc) 325-650mg four times a day (every meal & bedtime) A  prescription for pain medication should be given to you upon discharge.  Take your pain medication as prescribed, IF NEEDED.  If you are having problems/concerns with the prescription medicine (does not control pain, nausea, vomiting, rash, itching, etc), please call us [phone] to see if we need to switch you to a different pain medicine that will work better for you and/or control your side effect better. If you need a refill on your pain medication, please contact your pharmacy.  They will contact our office to request authorization. Prescriptions will not be filled after 5 pm or on week-ends.  Avoid getting constipated.  Between the surgery and the pain medications, it is common to experience some constipation.  Increasing fluid intake and taking a fiber supplement (such as Metamucil, Citrucel, FiberCon, MiraLax, etc) 1-2 times a day regularly will usually help prevent this problem from occurring.  A mild laxative (prune juice, Milk of Magnesia, MiraLax, etc) should be taken according to package directions if there are no bowel movements after 48 hours.    Wash / shower every day, starting 2 days after surgery.  You may shower over   the steri strips or skin glue which are waterproof.  No rubbing, scrubbing, lotions or ointments to incision(s). Do not soak or submerge.   Remove your outer bandage 2 days after surgery. Steri strips (if present) will peel off after 1-2 weeks. Glue (if present) will flake off after about 2 weeks.  You may leave the incision open to air.  You may replace a dressing/Band-Aid to cover  an incision for comfort if you wish.  Continue to shower over incision(s) after the dressing is off.  ACTIVITIES as tolerated:   You may resume regular (light) daily activities beginning the next day--such as daily self-care, walking, climbing stairs--gradually increasing activities as tolerated.  Control your pain so that you can walk an hour a day.  If you can walk 30 minutes without difficulty, it is safe to try more intense activity such as jogging, treadmill, bicycling, low-impact aerobics, swimming, etc. Refrain from the most intensive and strenuous activity such as sit-ups, heavy lifting, contact sports, etc  Refrain from any heavy lifting or straining until 6 weeks after surgery.   DO NOT PUSH THROUGH PAIN.  Let pain be your guide: If it hurts to do something, don't do it.  Pain is your body warning you to avoid that activity for another week until the pain goes down. You may drive when you are no longer taking prescription pain medication, you can comfortably wear a seatbelt, and you can safely maneuver your car and apply brakes. You may have sexual intercourse when it is comfortable.   FOLLOW UP in our office Please call [facility] at [phone] to set up an appointment to see your surgeon in the office for a follow-up appointment approximately 2-3 weeks after your surgery. Make sure that you call for this appointment the day you arrive home to insure a convenient appointment time.  9.  If you have disability of FMLA / Family leave forms, please bring the forms to the office for processing.  (do not give to your surgeon).  WHEN TO CALL US [phone]: Poor pain control Reactions / problems with new medications (rash/itching, nausea, etc)  Fever over 101.5 F (38.5 C) Inability to urinate Nausea and/or vomiting Worsening swelling or bruising Continued bleeding from incision. Increased pain, redness, or drainage from the incision   The clinic staff is available to answer your  questions during regular business hours (8:30am-5pm).  Please don't hesitate to call and ask to speak to one of our nurses for clinical concerns.   If you have a medical emergency, go to the nearest emergency room or call 911.  A surgeon from [facility] is always on call at the hospitals in [place]  [facility] [address], [address], [place], [place]  [address] ?  [address] [address], [place], [place]   [address] MAIN: [phone] ? TOLL FREE: [phone] ? FAX: [phone] [email or web address]  

## 2022-04-18 NOTE — Anesthesia Procedure Notes (Signed)
Procedure Name: Intubation Date/Time: 04/18/2022 7:32 AM  Performed by: Kaylyn Layer, MDPre-anesthesia Checklist: Patient identified, Emergency Drugs available, Suction available and Patient being monitored Patient Re-evaluated:Patient Re-evaluated prior to induction Oxygen Delivery Method: Circle system utilized Preoxygenation: Pre-oxygenation with 100% oxygen Induction Type: IV induction Ventilation: Mask ventilation without difficulty Laryngoscope Size: Glidescope and 4 Grade View: Grade II Tube type: Oral Tube size: 7.5 mm Number of attempts: 2 (Dr. Stephannie Peters stated that grade III view with Hyacinth Meeker 2; intubated successfully with glidescope #4.) Airway Equipment and Method: Stylet and Oral airway Placement Confirmation: ETT inserted through vocal cords under direct vision, positive ETCO2 and breath sounds checked- equal and bilateral Secured at: 23 cm Tube secured with: Tape Dental Injury: Teeth and Oropharynx as per pre-operative assessment

## 2022-04-18 NOTE — Op Note (Signed)
Operative Note  Jared [name]  [id number]  [id number]  04/18/2022   Surgeon: Phylliss Blakes MD FACS   Procedure performed: Robotic repair of recurrent right inguinal hernia (transabdominal, preperitoneal), primary repair of incisional umbilical hernia defect 7 mm   Preop diagnosis: Right groin pain, possible recurrent inguinal hernia Post-op diagnosis/intraop findings: Same, very small recurrent indirect and direct hernias on the right; no hernia on the left.  Umbilical fascial defect just under 7 mm, containing preperitoneal fat.   Specimens: no Retained items: no  EBL: Minimal cc Complications: none   Description of procedure: After confirming informed consent the patient was taken to the operating room and placed supine on operating room table where general endotracheal anesthesia was initiated, preoperative antibiotics were administered, SCDs applied, and a formal timeout was performed.  A Foley catheter was inserted which is removed at the end of the case.  The abdomen was prepped and draped in usual sterile fashion.  An infraumbilical incision was made and soft tissue dissected with cautery; the umbilical stalk was isolated and separated from the underlying hernia sac with cautery.  The hernia sac was noted to consist of chronically incarcerated preperitoneal fat which was excised.  The fascial defect was identified and carefully gently probed with a Kelly clamp; this was confirmed to communicate with the peritoneal cavity and the defect was under 7 mm.  An 8 mm robotic trocar was gently inserted, slightly dilating the defect to accommodate the trocar.  The abdominal cavity was insufflated to 15 mmHg without incident.  The patient was then placed in Trendelenburg and the pelvis inspected; on the left side there is no hernia whatsoever; on the right side there is a small divot at the indirect and a mild indentation at the direct space suggestive of recurrent hernia, concordant with the  patient CT scan that was done in October and symptoms.  Bilateral laparoscopic assisted tap blocks were performed with Exparel mixed with 0.25% Marcaine.  Bilateral 8 mm robotic trocars were placed under direct visualization.  The robot was then docked and instruments inserted under direct visualization.   Peritoneal flap was developed spanning from the anterior superior iliac spine to the medial umbilical ligament.  The preperitoneal fat was fairly adherent to the peritoneum especially laterally and this was fairly difficult to separate from the peritoneal flap.  A combination of sharp, blunt, and cautery dissection ensued to carefully free the adherent peritoneum and reduce the small indirect sac as well as the direct.  The space of Retzius was developed bluntly and the Cooper's ligament and myopectineal orifice exposed along with the femoral space.  The peritoneum was fairly adherent especially over the region of the iliac vessels, dissection here was meticulously and tediously carried out with cold scissors taking only thin transparent strands until sufficient space had been developed for the mesh.  The cord structures were peritonealized and protected.  The stasis was ensured within the field.  A Bard 3D max large mid weight mesh was inserted and placed over the hernia defect with excellent overlap.  This was secured to the Cooper's ligament as well as superiorly on either side of the inferior epigastric vessels with simple interrupted 3-0 Vicryl's.  The peritoneal flap was then brought back up to cover the mesh, ensuring that the mesh remained flush without any folding or buckling as we did so.  The peritoneum was closed with a running imbricating 2-0 V-Loc suture.  On completion the mesh is completely covered by peritoneum and confirmed that  there are no rents or defects in the peritoneal flap.  The abdomen was surveyed and confirmed to be free of any other abnormality.  The staple line from his previous  appendectomy is visible, there is no apparent adhesive disease or other gross abnormality.  At this point, the robotic instruments were removed and the robot was undocked.  The umbilical trocar was removed and the fascia at this point closed transversely with interrupted 0 Ethibond's.  The abdomen was then reinsufflated and the camera inserted to inspect the umbilical closure which was confirmed to be airtight, hemostatic, and free of any entrapped structures.  The abdomen was once again desufflated and the remaining trocars removed.  The umbilical skin was sutured back to the fascia with a 3-0 Vicryl.  The incisions were then closed with subcuticular 4-0 Monocryl and Dermabond. The patient was then awakened, extubated and taken to PACU in stable condition.    All counts were correct at the completion of the case.

## 2022-04-18 NOTE — Transfer of Care (Signed)
Immediate Anesthesia Transfer of Care Note  Patient: Jared [name]  Procedure(s) Performed: XI ROBOTIC ASSISTED RIGHT  INGUINAL HERNIA (Bilateral: Abdomen) HERNIA REPAIR UMBILICAL ADULT (Abdomen)  Patient Location: PACU  Anesthesia Type:General  Level of Consciousness: awake, alert , oriented, and patient cooperative  Airway & Oxygen Therapy: Patient Spontanous Breathing and Patient connected to face mask oxygen  Post-op Assessment: Report given to RN and Post -op Vital signs reviewed and stable  Post vital signs: Reviewed and stable  Last Vitals:  Vitals Value Taken Time  BP 123/62 04/18/22 1000  Temp 37.1 C 04/18/22 0958  Pulse 59 04/18/22 1002  Resp 16 04/18/22 1002  SpO2 100 % 04/18/22 1002  Vitals shown include unvalidated device data.  Last Pain:  Vitals:   04/18/22 0958  TempSrc:   PainSc: 0-No pain         Complications: No notable events documented.

## 2022-04-18 NOTE — Anesthesia Postprocedure Evaluation (Signed)
Anesthesia Post Note  Patient: Johnell Bas  Procedure(s) Performed: XI ROBOTIC ASSISTED RIGHT  INGUINAL HERNIA (Bilateral: Abdomen) HERNIA REPAIR UMBILICAL ADULT (Abdomen)     Patient location during evaluation: PACU Anesthesia Type: General Level of consciousness: awake and alert Pain management: pain level controlled Vital Signs Assessment: post-procedure vital signs reviewed and stable Respiratory status: spontaneous breathing, nonlabored ventilation and respiratory function stable Cardiovascular status: blood pressure returned to baseline Postop Assessment: no apparent nausea or vomiting Anesthetic complications: no   No notable events documented.  Last Vitals:  Vitals:   04/18/22 1000 04/18/22 1015  BP: 123/62 136/73  Pulse: 60 66  Resp: 12 14  Temp:    SpO2: 100% 97%    Last Pain:  Vitals:   04/18/22 1015  TempSrc:   PainSc: 0-No pain                 Shanda Howells

## 2022-04-22 ENCOUNTER — Encounter (HOSPITAL_COMMUNITY): Payer: Self-pay | Admitting: Surgery

## 2022-05-01 ENCOUNTER — Other Ambulatory Visit: Payer: Self-pay | Admitting: Cardiovascular Disease

## 2022-05-06 ENCOUNTER — Encounter (INDEPENDENT_AMBULATORY_CARE_PROVIDER_SITE_OTHER): Payer: Self-pay | Admitting: *Deleted

## 2022-05-20 ENCOUNTER — Encounter (INDEPENDENT_AMBULATORY_CARE_PROVIDER_SITE_OTHER): Payer: Self-pay | Admitting: *Deleted

## 2022-10-27 ENCOUNTER — Encounter (INDEPENDENT_AMBULATORY_CARE_PROVIDER_SITE_OTHER): Payer: Self-pay | Admitting: *Deleted

## 2022-11-17 ENCOUNTER — Encounter (INDEPENDENT_AMBULATORY_CARE_PROVIDER_SITE_OTHER): Payer: Self-pay | Admitting: *Deleted

## 2022-12-30 ENCOUNTER — Ambulatory Visit: Payer: BC Managed Care – PPO | Admitting: Physician Assistant

## 2023-01-13 ENCOUNTER — Encounter: Payer: Self-pay | Admitting: Physician Assistant

## 2023-01-13 ENCOUNTER — Ambulatory Visit: Payer: BC Managed Care – PPO | Attending: Physician Assistant | Admitting: Physician Assistant

## 2023-01-13 VITALS — BP 138/78 | Ht 70.0 in | Wt 205.0 lb

## 2023-01-13 DIAGNOSIS — I10 Essential (primary) hypertension: Secondary | ICD-10-CM

## 2023-01-13 DIAGNOSIS — I4891 Unspecified atrial fibrillation: Secondary | ICD-10-CM

## 2023-01-13 DIAGNOSIS — I480 Paroxysmal atrial fibrillation: Secondary | ICD-10-CM

## 2023-01-13 MED ORDER — OLMESARTAN MEDOXOMIL 40 MG PO TABS: 40.0000 mg | ORAL_TABLET | Freq: Every day | ORAL | 3 refills | Status: DC

## 2023-01-13 MED ORDER — DILTIAZEM HCL ER COATED BEADS 240 MG PO CP24: ORAL_CAPSULE | ORAL | 3 refills | Status: DC

## 2023-01-13 MED ORDER — HYDROCHLOROTHIAZIDE 25 MG PO TABS: 12.5000 mg | ORAL_TABLET | Freq: Every day | ORAL | 3 refills | Status: DC
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
## 2023-01-13 NOTE — Patient Instructions (Signed)
Medication Instructions:  NO CHANGES *If you need a refill on your cardiac medications before your next appointment, please call your pharmacy*   Lab Work: NO LABS If you have labs (blood work) drawn today and your tests are completely normal, you will receive your results only by: MyChart Message (if you have MyChart) OR A paper copy in the mail If you have any lab test that is abnormal or we need to change your treatment, we will call you to review the results.   Testing/Procedures: NO TESTING   Follow-Up: At [facility], you and your health needs are our priority.  As part of our continuing mission to provide you with exceptional heart care, we have created designated Provider Care Teams.  These Care Teams include your primary Cardiologist (physician) and Advanced Practice Providers (APPs -  Physician Assistants and Nurse Practitioners) who all work together to provide you with the care you need, when you need it.   Your next appointment:   1 year(s)  Provider:   Nicki Guadalajara, MD

## 2023-03-31 ENCOUNTER — Telehealth (INDEPENDENT_AMBULATORY_CARE_PROVIDER_SITE_OTHER): Payer: Self-pay | Admitting: Gastroenterology

## 2023-03-31 NOTE — Telephone Encounter (Signed)
Ok to schedule.  Room Any Room   Thanks,  Vista Lawman, MD Gastroenterology and Hepatology [facility]

## 2023-03-31 NOTE — Telephone Encounter (Signed)
Who is your primary care physician: Dr.Zack Margo Aye  Reasons for the colonoscopy: screening  Have you had a colonoscopy before?  Yes age 62 or 64  Do you have family history of colon cancer? Yes father  Previous colonoscopy with polyps removed? no  Do you have a history colorectal cancer?   no  Are you diabetic? If yes, Type 1 or Type 2?    no  Do you have a prosthetic or mechanical heart valve? no  Do you have a pacemaker/defibrillator?   no  Have you had endocarditis/atrial fibrillation? yes  Have you had joint replacement within the last 12 months?  Shoulder on 02/17/22  Do you tend to be constipated or have to use laxatives? Not really  Do you have any history of drugs or alchohol?  no  Do you use supplemental oxygen?  no  Have you had a stroke or heart attack within the last 6 months? no  Do you take weight loss medication?  no   Do you take any blood-thinning medications such as: (aspirin, warfarin, Plavix, Aggrenox)    If yes we need the name, milligram, dosage and who is prescribing doctor 81mg  Aspirin Current Outpatient Medications on File Prior to Visit  Medication Sig Dispense Refill   Ascorbic Acid (VITAMIN C) 1000 MG tablet Take 1,000 mg by mouth in the morning and at bedtime.     aspirin EC (ADULT ASPIRIN REGIMEN) 81 MG tablet Take 81 mg by mouth daily.     b complex vitamins tablet Take 1 tablet by mouth in the morning.     BIOTIN 5000 PO Take 10,000 mcg by mouth in the morning and at bedtime.     Cholecalciferol (VITAMIN D3) 125 MCG (5000 UT) TABS Take 5,000 Units by mouth in the morning.     diltiazem (CARDIZEM CD) 240 MG 24 hr capsule TAKE ONE CAPSULE EACH DAY 90 capsule 3   diphenhydrAMINE (BENADRYL) 25 MG tablet Take 25 mg by mouth at bedtime.     docusate sodium (COLACE) 100 MG capsule Take 100-200 mg by mouth See admin instructions. Take 2 capsules (200 mg) by mouth in the morning & take 1 capsule (100 mg) by mouth at night.     gabapentin (NEURONTIN)  300 MG capsule Take 1 capsule (300 mg total) by mouth every evening. (Patient taking differently: Take 300 mg by mouth See admin instructions. Take 1 tablet (300 mg) by mouth scheduled every night & may take an additional dose in the mid to late afternoon if needed for pain.) 30 capsule 0   gabapentin (NEURONTIN) 300 MG capsule Take 3 capsules (900 mg total) by mouth daily. (Patient taking differently: Take 900 mg by mouth daily at 12 noon.) 120 capsule 0   hydrochlorothiazide (HYDRODIURIL) 25 MG tablet Take 0.5 tablets (12.5 mg total) by mouth daily. 45 tablet 3   Magnesium 250 MG TABS Take 250 mg by mouth at bedtime.     methocarbamol (ROBAXIN) 500 MG tablet Take 1 tablet (500 mg total) by mouth every 8 (eight) hours as needed for muscle spasms. 30 tablet 0   Multiple Vitamin (MULTIVITAMIN WITH MINERALS) TABS tablet Take 1 tablet by mouth in the morning.     olmesartan (BENICAR) 40 MG tablet Take 1 tablet (40 mg total) by mouth daily. 90 tablet 3   Potassium 99 MG TABS Take 99 mg by mouth in the morning.     sildenafil (REVATIO) 20 MG tablet Take 20-100 mg by mouth daily as  needed (erectile dysfunction.).     Zinc 50 MG TABS Take 50 mg by mouth daily.     amoxicillin (AMOXIL) 500 MG capsule Take 500 mg by mouth. Prior to dental procedures (Patient not taking: Reported on 01/13/2023)     No current facility-administered medications on file prior to visit.    No Known Allergies   [facility]  Primary Insurance Name: Ezequiel Essex  Best number where you can be reached: [phone]

## 2023-04-01 MED ORDER — PEG 3350-KCL-NA BICARB-NACL 420 G PO SOLR: 4000.0000 mL | Freq: Once | ORAL | 0 refills | Status: AC

## 2023-04-01 NOTE — Telephone Encounter (Signed)
Pt came in office yesterday to drop off questionnaire. Pt states that if any way possible could we get him in before his wife goes back to work due to her being a Engineer, site and his ride. Went over dates with pt and placed him on 04/27/23 at 10:45am.  Prep sent to pharmacy and instructions mailed to patient. No pa needed per insurance.

## 2023-04-01 NOTE — Addendum Note (Signed)
Addended by: Marlowe Shores on: 04/01/2023 09:18 AM   Modules accepted: Orders

## 2023-04-02 ENCOUNTER — Encounter (INDEPENDENT_AMBULATORY_CARE_PROVIDER_SITE_OTHER): Payer: Self-pay | Admitting: *Deleted

## 2023-04-02 NOTE — Telephone Encounter (Signed)
Referral completed, [facility] apt letter sent to PCP

## 2023-04-27 ENCOUNTER — Ambulatory Visit (HOSPITAL_COMMUNITY)
Admission: RE | Admit: 2023-04-27 | Discharge: 2023-04-27 | Disposition: A | Discharge status: Home / Self Care | Payer: BC Managed Care – PPO | Attending: Gastroenterology | Admitting: Gastroenterology

## 2023-04-27 ENCOUNTER — Encounter (HOSPITAL_COMMUNITY)
Admission: RE | Disposition: A | Discharge status: Home / Self Care | Payer: Self-pay | Source: Home / Self Care | Attending: Gastroenterology

## 2023-04-27 ENCOUNTER — Ambulatory Visit (HOSPITAL_COMMUNITY): Payer: BC Managed Care – PPO | Admitting: Anesthesiology

## 2023-04-27 ENCOUNTER — Encounter (HOSPITAL_COMMUNITY): Payer: Self-pay

## 2023-04-27 DIAGNOSIS — D120 Benign neoplasm of cecum: Secondary | ICD-10-CM | POA: Diagnosis not present

## 2023-04-27 DIAGNOSIS — K219 Gastro-esophageal reflux disease without esophagitis: Secondary | ICD-10-CM | POA: Diagnosis not present

## 2023-04-27 DIAGNOSIS — Z87891 Personal history of nicotine dependence: Secondary | ICD-10-CM | POA: Diagnosis not present

## 2023-04-27 DIAGNOSIS — Z1211 Encounter for screening for malignant neoplasm of colon: Secondary | ICD-10-CM | POA: Insufficient documentation

## 2023-04-27 DIAGNOSIS — I10 Essential (primary) hypertension: Secondary | ICD-10-CM | POA: Insufficient documentation

## 2023-04-27 DIAGNOSIS — F32A Depression, unspecified: Secondary | ICD-10-CM | POA: Insufficient documentation

## 2023-04-27 DIAGNOSIS — K279 Peptic ulcer, site unspecified, unspecified as acute or chronic, without hemorrhage or perforation: Secondary | ICD-10-CM | POA: Diagnosis not present

## 2023-04-27 DIAGNOSIS — K644 Residual hemorrhoidal skin tags: Secondary | ICD-10-CM | POA: Diagnosis not present

## 2023-04-27 DIAGNOSIS — D123 Benign neoplasm of transverse colon: Secondary | ICD-10-CM | POA: Diagnosis not present

## 2023-04-27 DIAGNOSIS — D649 Anemia, unspecified: Secondary | ICD-10-CM | POA: Diagnosis not present

## 2023-04-27 DIAGNOSIS — Z800 Family history of malignant neoplasm of digestive organs: Secondary | ICD-10-CM | POA: Diagnosis not present

## 2023-04-27 DIAGNOSIS — K635 Polyp of colon: Secondary | ICD-10-CM

## 2023-04-27 DIAGNOSIS — K648 Other hemorrhoids: Secondary | ICD-10-CM | POA: Diagnosis not present

## 2023-04-27 HISTORY — PX: COLONOSCOPY WITH PROPOFOL: SHX5780

## 2023-04-27 HISTORY — PX: POLYPECTOMY: SHX149

## 2023-04-27 LAB — HM COLONOSCOPY

## 2023-04-27 SURGERY — COLONOSCOPY WITH PROPOFOL
Anesthesia: General

## 2023-04-27 MED ORDER — PROPOFOL 1000 MG/100ML IV EMUL
INTRAVENOUS | Status: AC
  Filled 2023-04-27: qty 100

## 2023-04-27 MED ORDER — LACTATED RINGERS IV SOLN: INTRAVENOUS | Status: DC

## 2023-04-27 MED ORDER — PROPOFOL 10 MG/ML IV BOLUS
INTRAVENOUS | Status: DC | PRN
  Administered 2023-04-27: 100 mg via INTRAVENOUS

## 2023-04-27 MED ORDER — LIDOCAINE HCL (PF) 2 % IJ SOLN
INTRAMUSCULAR | Status: AC
  Filled 2023-04-27: qty 5

## 2023-04-27 MED ORDER — LACTATED RINGERS IV SOLN: INTRAVENOUS | Status: DC | PRN

## 2023-04-27 MED ORDER — PROPOFOL 500 MG/50ML IV EMUL
INTRAVENOUS | Status: DC | PRN
  Administered 2023-04-27: 150 ug/kg/min via INTRAVENOUS

## 2023-04-27 MED ORDER — LIDOCAINE HCL (CARDIAC) PF 100 MG/5ML IV SOSY
PREFILLED_SYRINGE | INTRAVENOUS | Status: DC | PRN
  Administered 2023-04-27: 60 mg via INTRAVENOUS

## 2023-04-27 NOTE — H&P (Signed)
Primary Care Physician:  Benita Stabile, MD Primary Gastroenterologist:  Dr. Tasia Catchings  Pre-Procedure History & Physical: HPI:  Jared [name] is a 62 y.o. male is here for a colonoscopy for colon cancer screening purposes.  Patient denies any family history of colorectal cancer.  No melena or hematochezia.  No abdominal pain or unintentional weight loss.  No change in bowel habits.  Overall feels well from a GI standpoint.  Past Medical History:  Diagnosis Date   Arthritis    Dysrhythmia    PAF  history of on ASA   Hypertension    PVC (premature ventricular contraction)     Past Surgical History:  Procedure Laterality Date   HERNIA REPAIR Bilateral    as a child   LAPAROSCOPIC APPENDECTOMY N/A 06/20/2021   Procedure: APPENDECTOMY LAPAROSCOPIC;  Surgeon: Berna Bue, MD;  Location: [facility];  Service: General;  Laterality: N/A;   REVERSE SHOULDER ARTHROPLASTY Left 02/17/2022   Procedure: REVERSE SHOULDER ARTHROPLASTY;  Surgeon: Bjorn Pippin, MD;  Location: [facility];  Service: Orthopedics;  Laterality: Left;   SHOULDER CLOSED REDUCTION Left 02/16/2022   Procedure: CLOSED REDUCTION SHOULDER;  Surgeon: Joen Laura, MD;  Location: [facility] OR;  Service: Orthopedics;  Laterality: Left;   TIBIA IM NAIL INSERTION Left 02/14/2022   Procedure: INTRAMEDULLARY (IM) NAIL TIBIAL;  Surgeon: Roby Lofts, MD;  Location: [facility] OR;  Service: Orthopedics;  Laterality: Left;   UMBILICAL HERNIA REPAIR N/A 04/18/2022   Procedure: HERNIA REPAIR UMBILICAL ADULT;  Surgeon: Berna Bue, MD;  Location: [facility];  Service: General;  Laterality: N/A;    Prior to Admission medications   Medication Sig Start Date End Date Taking? Authorizing Provider  Ascorbic Acid (VITAMIN C) 1000 MG tablet Take 1,000 mg by mouth in the morning and at bedtime.   Yes [provider]  aspirin EC (ADULT ASPIRIN REGIMEN) 81 MG tablet Take 81 mg by mouth daily.   Yes [provider]  b complex vitamins tablet  Take 1 tablet by mouth in the morning.   Yes [provider]  BIOTIN 5000 PO Take 10,000 mcg by mouth in the morning and at bedtime.   Yes [provider]  diltiazem (CARDIZEM CD) 240 MG 24 hr capsule TAKE ONE CAPSULE EACH DAY 01/13/23  Yes Azalee Course, PA  docusate sodium (COLACE) 100 MG capsule Take 100-200 mg by mouth See admin instructions. Take 2 capsules (200 mg) by mouth in the morning & take 1 capsule (100 mg) by mouth at night.   Yes [provider]  gabapentin (NEURONTIN) 300 MG capsule Take 1 capsule (300 mg total) by mouth every evening. Patient taking differently: Take 300 mg by mouth See admin instructions. Take 1 tablet (300 mg) by mouth scheduled every night & may take an additional dose in the mid to late afternoon if needed for pain. 02/19/22  Yes Rhetta Mura, MD  hydrochlorothiazide (HYDRODIURIL) 25 MG tablet Take 0.5 tablets (12.5 mg total) by mouth daily. 01/13/23  Yes Azalee Course, PA  Magnesium 250 MG TABS Take 250 mg by mouth at bedtime.   Yes [provider]  methocarbamol (ROBAXIN) 500 MG tablet Take 1 tablet (500 mg total) by mouth every 8 (eight) hours as needed for muscle spasms. 02/19/22  Yes McBane, Jerald Kief, PA-C  Multiple Vitamin (MULTIVITAMIN WITH MINERALS) TABS tablet Take 1 tablet by mouth in the morning.   Yes [provider]  olmesartan (BENICAR) 40 MG tablet Take 1 tablet (40  mg total) by mouth daily. 01/13/23  Yes Azalee Course, PA  Potassium 99 MG TABS Take 99 mg by mouth in the morning.   Yes [provider]  amoxicillin (AMOXIL) 500 MG capsule Take 500 mg by mouth. Prior to dental procedures Patient not taking: Reported on 01/13/2023 10/27/22   [provider]  Cholecalciferol (VITAMIN D3) 125 MCG (5000 UT) TABS Take 5,000 Units by mouth in the morning.    [provider]  diphenhydrAMINE (BENADRYL) 25 MG tablet Take 25 mg by mouth at bedtime.    [provider]  gabapentin  (NEURONTIN) 300 MG capsule Take 3 capsules (900 mg total) by mouth daily. Patient taking differently: Take 900 mg by mouth daily at 12 noon. 02/19/22   Rhetta Mura, MD  sildenafil (REVATIO) 20 MG tablet Take 20-100 mg by mouth daily as needed (erectile dysfunction.). 10/28/21   [provider]  Zinc 50 MG TABS Take 50 mg by mouth daily.    [provider]    Allergies as of 04/01/2023   (No Known Allergies)    Family History  Problem Relation Age of Onset   Heart disease Mother    Hypertension Mother    Stroke Mother    Heart disease Father    Heart attack Father    Alcohol abuse Paternal Uncle     Social History   Socioeconomic History   Marital status: Married    Spouse name: Not on file   Number of children: Not on file   Years of education: Not on file   Highest education level: Not on file  Occupational History   Not on file  Tobacco Use   Smoking status: Former    Types: Cigarettes   Smokeless tobacco: Former    Quit date: 01/28/1996   Tobacco comments:    Quit 25 years ago  Vaping Use   Vaping status: Never Used  Substance and Sexual Activity   Alcohol use: No    Alcohol/week: 18.0 standard drinks of alcohol    Types: 4 Glasses of wine, 14 Standard drinks or equivalent per week    Comment: Quit in 07/2015.   Drug use: No   Sexual activity: Yes    Birth control/protection: None  Other Topics Concern   Not on file  Social History Narrative   Not on file   Social Drivers of Health   Financial Resource Strain: Not on file  Food Insecurity: No Food Insecurity (02/18/2022)   Hunger Vital Sign    Worried About Running Out of Food in the Last Year: Never true    Ran Out of Food in the Last Year: Never true  Transportation Needs: No Transportation Needs (02/17/2022)   PRAPARE - Administrator, Civil Service (Medical): No    Lack of Transportation (Non-Medical): No  Physical Activity: Not on file  Stress: Not on file   Social Connections: Not on file  Intimate Partner Violence: Not At Risk (02/17/2022)   Humiliation, Afraid, Rape, and Kick questionnaire    Fear of Current or Ex-Partner: No    Emotionally Abused: No    Physically Abused: No    Sexually Abused: No    Review of Systems: See HPI, otherwise negative ROS  Physical Exam: Vital signs in last 24 hours: Temp:  [97.8 F (36.6 C)] 97.8 F (36.6 C) (12/30 0921) Pulse Rate:  [66] 66 (12/30 0921) Resp:  [18] 18 (12/30 0921) BP: (135)/(78) 135/78 (12/30 0921) SpO2:  [96 %]  96 % (12/30 0921) Weight:  [86.2 kg] 86.2 kg (12/30 0931)   General:   Alert,  Well-developed, well-nourished, pleasant and cooperative in NAD Head:  Normocephalic and atraumatic. Eyes:  Sclera clear, no icterus.   Conjunctiva pink. Ears:  Normal auditory acuity. Nose:  No deformity, discharge,  or lesions. Msk:  Symmetrical without gross deformities. Normal posture. Extremities:  Without clubbing or edema. Neurologic:  Alert and  oriented x4;  grossly normal neurologically. Skin:  Intact without significant lesions or rashes. Psych:  Alert and cooperative. Normal mood and affect.  Impression/Plan: Jared [name] is here for a colonoscopy to be performed for colon cancer screening purposes.  The risks of the procedure including infection, bleed, or perforation as well as benefits, limitations, alternatives and imponderables have been reviewed with the patient. Questions have been answered. All parties agreeable.

## 2023-04-27 NOTE — Discharge Instructions (Signed)

## 2023-04-27 NOTE — Op Note (Signed)
[facility] Patient Name: Jared [name] Procedure Date: 04/27/2023 11:02 AM MRN: [id number] Date of Birth: 04-04-1961 Attending MD: Sanjuan Dame , MD, [id number] CSN: [id number] Age: 62 Admit Type: Outpatient Procedure:                Colonoscopy Indications:              Screening in patient at increased risk: Family                            history of 1st-degree relative with colorectal                            cancer Providers:                Sanjuan Dame, MD, Crystal Page, Elinor Parkinson Referring MD:              Medicines:                Monitored Anesthesia Care Complications:            No immediate complications. Estimated Blood Loss:     Estimated blood loss: none. Procedure:                Pre-Anesthesia Assessment:                           - Prior to the procedure, a History and Physical                            was performed, and patient medications and                            allergies were reviewed. The patient's tolerance of                            previous anesthesia was also reviewed. The risks                            and benefits of the procedure and the sedation                            options and risks were discussed with the patient.                            All questions were answered, and informed consent                            was obtained. Prior Anticoagulants: The patient has                            taken no anticoagulant or antiplatelet agents                            except for aspirin. ASA Grade Assessment: II - A  patient with mild systemic disease. After reviewing                            the risks and benefits, the patient was deemed in                            satisfactory condition to undergo the procedure.                           After obtaining informed consent, the colonoscope                            was passed under direct vision. Throughout the                             procedure, the patient's blood pressure, pulse, and                            oxygen saturations were monitored continuously. The                            [id number]) scope was introduced through the                            anus and advanced to the the cecum, identified by                            appendiceal orifice and ileocecal valve. The                            colonoscopy was performed without difficulty. The                            patient tolerated the procedure well. The quality                            of the bowel preparation was evaluated using the                            BBPS [facility] Bowel Preparation Scale) with scores                            of: Right Colon = 3, Transverse Colon = 3 and Left                            Colon = 3 (entire mucosa seen well with no residual                            staining, small fragments of stool or opaque                            liquid). The total BBPS score equals 9. The  ileocecal valve, appendiceal orifice, and rectum                            were photographed. Scope In: 11:12:47 AM Scope Out: 11:34:51 AM Scope Withdrawal Time: 0 hours 17 minutes 25 seconds  Total Procedure Duration: 0 hours 22 minutes 4 seconds  Findings:      The perianal and digital rectal examinations were normal.      Two sessile polyps were found in the transverse colon and cecum. The       polyps were 2 to 4 mm in size. These polyps were removed with a cold       snare. Resection and retrieval were complete.      Non-bleeding external and internal hemorrhoids were found during       retroflexion. The hemorrhoids were small. Impression:               - Two 2 to 4 mm polyps in the transverse colon and                            in the cecum, removed with a cold snare. Resected                            and retrieved.                           - Non-bleeding external and internal hemorrhoids. Moderate  Sedation:      Per Anesthesia Care Recommendation:           - Patient has a contact number available for                            emergencies. The signs and symptoms of potential                            delayed complications were discussed with the                            patient. Return to normal activities tomorrow.                            Written discharge instructions were provided to the                            patient.                           - Resume previous diet.                           - Continue present medications.                           - Await pathology results.                           - Repeat colonoscopy in 5 years for surveillance.                           -  Return to primary care physician as previously                            scheduled. Procedure Code(s):        --- Professional ---                           [phone], Colonoscopy, flexible; with removal of                            tumor(s), polyp(s), or other lesion(s) by snare                            technique Diagnosis Code(s):        --- Professional ---                           Z80.0, Family history of malignant neoplasm of                            digestive organs                           D12.3, Benign neoplasm of transverse colon (hepatic                            flexure or splenic flexure)                           D12.0, Benign neoplasm of cecum                           K64.8, Other hemorrhoids CPT copyright 2022 American Medical Association. All rights reserved. The codes documented in this report are preliminary and upon coder review may  be revised to meet current compliance requirements. Sanjuan Dame, MD Sanjuan Dame, MD 04/27/2023 11:39:50 AM This report has been signed electronically. Number of Addenda: 0

## 2023-04-27 NOTE — Anesthesia Preprocedure Evaluation (Signed)
Anesthesia Evaluation  Patient identified by MRN, date of birth, ID band Patient awake    Reviewed: Allergy & Precautions, H&P , NPO status , Patient's Chart, lab work & pertinent test results, reviewed documented beta blocker date and time   Airway Mallampati: II  TM Distance: >3 FB Neck ROM: full    Dental no notable dental hx.    Pulmonary neg pulmonary ROS, former smoker   Pulmonary exam normal breath sounds clear to auscultation       Cardiovascular Exercise Tolerance: Good hypertension, negative cardio ROS + dysrhythmias  Rhythm:regular Rate:Normal     Neuro/Psych  PSYCHIATRIC DISORDERS  Depression    negative neurological ROS  negative psych ROS   GI/Hepatic negative GI ROS, Neg liver ROS, PUD,GERD  ,,  Endo/Other  negative endocrine ROS    Renal/GU negative Renal ROS  negative genitourinary   Musculoskeletal   Abdominal   Peds  Hematology negative hematology ROS (+) Blood dyscrasia, anemia   Anesthesia Other Findings   Reproductive/Obstetrics negative OB ROS                             Anesthesia Physical Anesthesia Plan  ASA: 2  Anesthesia Plan: General   Post-op Pain Management:    Induction:   PONV Risk Score and Plan: Propofol infusion  Airway Management Planned:   Additional Equipment:   Intra-op Plan:   Post-operative Plan:   Informed Consent: I have reviewed the patients History and Physical, chart, labs and discussed the procedure including the risks, benefits and alternatives for the proposed anesthesia with the patient or authorized representative who has indicated his/her understanding and acceptance.     Dental Advisory Given  Plan Discussed with: CRNA  Anesthesia Plan Comments:        Anesthesia Quick Evaluation

## 2023-04-27 NOTE — Transfer of Care (Addendum)
Immediate Anesthesia Transfer of Care Note  Patient: Jared [name]  Procedure(s) Performed: COLONOSCOPY WITH PROPOFOL POLYPECTOMY INTESTINAL  Patient Location: Endoscopy Unit  Anesthesia Type:General  Level of Consciousness: drowsy and patient cooperative  Airway & Oxygen Therapy: Patient Spontanous Breathing and Patient connected to nasal cannula oxygen  Post-op Assessment: Report given to RN and Post -op Vital signs reviewed and stable  Post vital signs: Reviewed and stable  Last Vitals:  Vitals Value Taken Time  BP 118/68 04/27/23   1142  Temp 37 04/27/23   1142  Pulse 71 04/27/23   1142  Resp 22 04/27/23   1142  SpO2 96% 04/27/23   1142    Last Pain:  Vitals:   04/27/23 1108  TempSrc:   PainSc: 0-No pain      Patients Stated Pain Goal: 5 (04/27/23 0931)  Complications: No notable events documented.

## 2023-04-28 ENCOUNTER — Encounter (INDEPENDENT_AMBULATORY_CARE_PROVIDER_SITE_OTHER): Payer: Self-pay | Admitting: *Deleted

## 2023-04-28 NOTE — Anesthesia Postprocedure Evaluation (Signed)
 Anesthesia Post Note  Patient: Jared [name]  Procedure(s) Performed: COLONOSCOPY WITH PROPOFOL  POLYPECTOMY INTESTINAL  Patient location during evaluation: Phase II Anesthesia Type: General Level of consciousness: awake Pain management: pain level controlled Vital Signs Assessment: post-procedure vital signs reviewed and stable Respiratory status: spontaneous breathing and respiratory function stable Cardiovascular status: blood pressure returned to baseline and stable Postop Assessment: no headache and no apparent nausea or vomiting Anesthetic complications: no Comments: Late entry   No notable events documented.   Last Vitals:  Vitals:   04/27/23 0921 04/27/23 1142  BP: 135/78 118/68  Pulse: 66 71  Resp: 18 (!) 22  Temp: 36.6 C 37 C  SpO2: 96% 96%    Last Pain:  Vitals:   04/27/23 1142  TempSrc: Axillary  PainSc: 0-No pain                 Yvonna JINNY Bosworth

## 2023-04-30 LAB — SURGICAL PATHOLOGY

## 2023-05-04 ENCOUNTER — Encounter (INDEPENDENT_AMBULATORY_CARE_PROVIDER_SITE_OTHER): Payer: Self-pay | Admitting: *Deleted

## 2023-05-08 ENCOUNTER — Encounter (HOSPITAL_COMMUNITY): Payer: Self-pay | Admitting: Gastroenterology

## 2023-06-10 ENCOUNTER — Encounter: Payer: Self-pay | Admitting: Neurology

## 2023-06-10 ENCOUNTER — Ambulatory Visit: Payer: 59 | Admitting: Neurology

## 2023-06-10 VITALS — BP 143/73 | HR 63 | Ht 70.0 in | Wt 201.0 lb

## 2023-06-10 DIAGNOSIS — R42 Dizziness and giddiness: Secondary | ICD-10-CM | POA: Diagnosis not present
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
## 2023-06-10 NOTE — Patient Instructions (Signed)
Continue current medications  Continue to follow up with PCP  Return as needed or if symptoms do get worse

## 2023-11-13 ENCOUNTER — Emergency Department (HOSPITAL_COMMUNITY)

## 2023-11-13 ENCOUNTER — Encounter (HOSPITAL_COMMUNITY): Payer: Self-pay | Admitting: Emergency Medicine

## 2023-11-13 ENCOUNTER — Other Ambulatory Visit: Payer: Self-pay

## 2023-11-13 ENCOUNTER — Emergency Department (HOSPITAL_COMMUNITY)
Admission: EM | Admit: 2023-11-13 | Discharge: 2023-11-13 | Disposition: A | Discharge status: Home / Self Care | Source: Other Acute Inpatient Hospital | Attending: Emergency Medicine | Admitting: Emergency Medicine

## 2023-11-13 DIAGNOSIS — W1809XA Striking against other object with subsequent fall, initial encounter: Secondary | ICD-10-CM | POA: Insufficient documentation

## 2023-11-13 DIAGNOSIS — S0990XA Unspecified injury of head, initial encounter: Secondary | ICD-10-CM | POA: Diagnosis present

## 2023-11-13 DIAGNOSIS — S0083XA Contusion of other part of head, initial encounter: Secondary | ICD-10-CM | POA: Insufficient documentation

## 2023-11-13 DIAGNOSIS — J069 Acute upper respiratory infection, unspecified: Secondary | ICD-10-CM | POA: Insufficient documentation

## 2023-11-13 DIAGNOSIS — Z7982 Long term (current) use of aspirin: Secondary | ICD-10-CM | POA: Insufficient documentation

## 2023-11-13 DIAGNOSIS — E860 Dehydration: Secondary | ICD-10-CM | POA: Diagnosis not present

## 2023-11-13 DIAGNOSIS — E876 Hypokalemia: Secondary | ICD-10-CM | POA: Insufficient documentation

## 2023-11-13 DIAGNOSIS — R55 Syncope and collapse: Secondary | ICD-10-CM | POA: Insufficient documentation

## 2023-11-13 DIAGNOSIS — E871 Hypo-osmolality and hyponatremia: Secondary | ICD-10-CM | POA: Insufficient documentation

## 2023-11-13 DIAGNOSIS — S0081XA Abrasion of other part of head, initial encounter: Secondary | ICD-10-CM

## 2023-11-13 LAB — COMPREHENSIVE METABOLIC PANEL WITH GFR
ALT: 29 U/L (ref 0–44)
AST: 31 U/L (ref 15–41)
Albumin: 3.9 g/dL (ref 3.5–5.0)
Alkaline Phosphatase: 83 U/L (ref 38–126)
Anion gap: 15 (ref 5–15)
BUN: 19 mg/dL (ref 8–23)
CO2: 21 mmol/L — ABNORMAL LOW (ref 22–32)
Calcium: 8.8 mg/dL — ABNORMAL LOW (ref 8.9–10.3)
Chloride: 94 mmol/L — ABNORMAL LOW (ref 98–111)
Creatinine, Ser: 0.95 mg/dL (ref 0.61–1.24)
GFR, Estimated: >60 mL/min (ref >60)
Glucose, Bld: 130 mg/dL — ABNORMAL HIGH (ref 70–99)
Potassium: 3.2 mmol/L — ABNORMAL LOW (ref 3.5–5.1)
Sodium: 130 mmol/L — ABNORMAL LOW (ref 135–145)
Total Bilirubin: 1.3 mg/dL — ABNORMAL HIGH (ref 0.0–1.2)
Total Protein: 7.2 g/dL (ref 6.5–8.1)

## 2023-11-13 LAB — CBC
HCT: 39.5 % (ref 39.0–52.0)
Hemoglobin: 14 g/dL (ref 13.0–17.0)
MCH: 32.9 pg (ref 26.0–34.0)
MCHC: 35.4 g/dL (ref 30.0–36.0)
MCV: 92.9 fL (ref 80.0–100.0)
Platelets: 200 K/uL (ref 150–400)
RBC: 4.25 MIL/uL (ref 4.22–5.81)
RDW: 12.4 % (ref 11.5–15.5)
WBC: 5.4 K/uL (ref 4.0–10.5)
nRBC: 0 % (ref 0.0–0.2)

## 2023-11-13 LAB — RESP PANEL BY RT-PCR (RSV, FLU A&B, COVID)  RVPGX2
Influenza A by PCR: NEGATIVE
Influenza B by PCR: NEGATIVE
Resp Syncytial Virus by PCR: NEGATIVE
SARS Coronavirus 2 by RT PCR: NEGATIVE

## 2023-11-13 MED ORDER — BACITRACIN ZINC 500 UNIT/GM EX OINT
TOPICAL_OINTMENT | Freq: Once | CUTANEOUS | Status: AC
  Administered 2023-11-13: 1 via TOPICAL
  Filled 2023-11-13: qty 0.9

## 2023-11-13 MED ORDER — LACTATED RINGERS IV BOLUS
1000.0000 mL | Freq: Once | INTRAVENOUS | Status: AC
  Administered 2023-11-13: 1000 mL via INTRAVENOUS

## 2023-11-13 MED ORDER — SODIUM CHLORIDE 0.9 % IV BOLUS
1000.0000 mL | Freq: Once | INTRAVENOUS | Status: AC
  Administered 2023-11-13: 1000 mL via INTRAVENOUS

## 2023-11-13 MED ORDER — POTASSIUM CHLORIDE 20 MEQ PO PACK
40.0000 meq | PACK | Freq: Once | ORAL | Status: AC
  Administered 2023-11-13: 40 meq via ORAL
  Filled 2023-11-13: qty 2

## 2023-11-13 MED ORDER — LACTATED RINGERS IV BOLUS: 1000.0000 mL | Freq: Once | INTRAVENOUS | Status: DC
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
## 2023-11-13 NOTE — ED Notes (Signed)
 Pt started to feel dizzy and lightheaded after standing for two minutes during orthostatic vital signs. Provider made aware.

## 2023-11-13 NOTE — Discharge Instructions (Addendum)
 It was our pleasure to provide your ER care today - we hope that you feel better.  Drink plenty of fluids/stay well hydrated. Keep abrasions very clean.   From today's labs, your sodium level is mildly low, and potassium level low - eat balanced diet, eat plenty of fruits and vegetables, and follow up with your doctor.   Follow up closely with primary care doctor in the coming week.   Return to ER right away if worse, new symptoms, fevers, chest pain, trouble breathing, fainting, new/severe pain, infection of wounds, or other concern.

## 2023-11-13 NOTE — ED Notes (Signed)
 PO challenge attempted at this time. Pt was given crackers and water  for the PO challenge.

## 2023-11-13 NOTE — ED Notes (Signed)
 Patient ambulated to the bathroom with no assistance. Patient states he did not feel lightheaded while ambulating.

## 2023-11-13 NOTE — ED Notes (Signed)
 Abrasions cleaned, bacitracin ointment applied at this time.

## 2023-12-08 IMAGING — CT CT ABD-PELV W/ CM
2 of 5 series · 16 of 46 positions shown, 18 images · IV contrast (agent unspecified)
Comparison: None.

CLINICAL DATA: F/U US bilat inguinal hernia repair and now c/o
intermittent swelling in the area since [DATE]. Was dx with
bronchitis with a cough.

EXAM:
CT ABDOMEN AND PELVIS WITH CONTRAST
TECHNIQUE: Multidetector CT imaging of the abdomen and pelvis was performed
using the standard protocol following bolus administration of
intravenous contrast.

[Series 2: abd pel w · axial · 0.81mm/px · z∈[+860,+1270]mm · 13 of 96 slices shown, 15 images]
[im 7/96  soft-tissue]
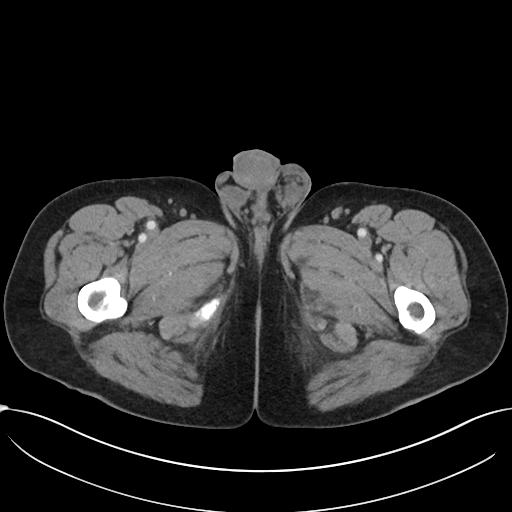
[im 7/96  bone]
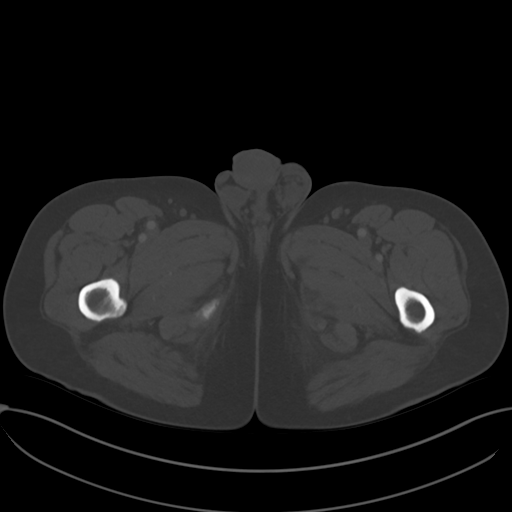
[im 14/96  soft-tissue]
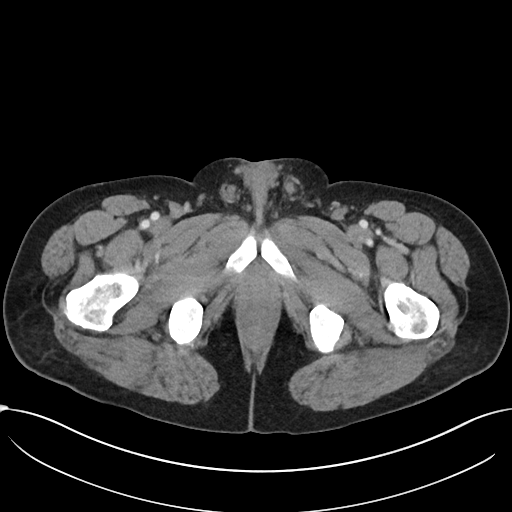
[im 21/96  soft-tissue]
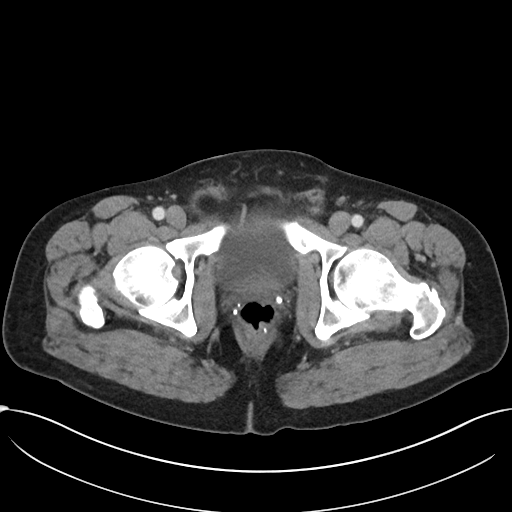
[im 28/96  soft-tissue]
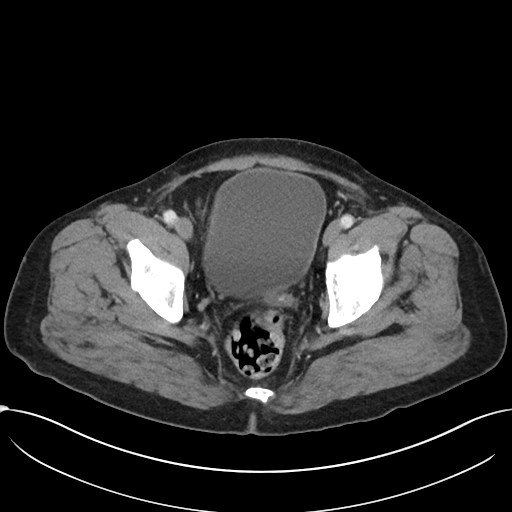
[im 34/96  soft-tissue]
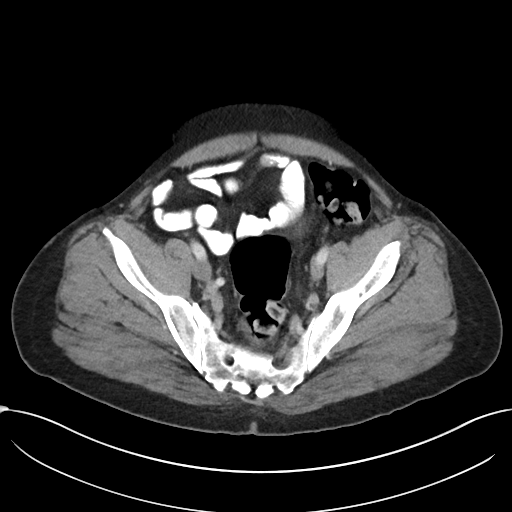
[im 41/96  soft-tissue]
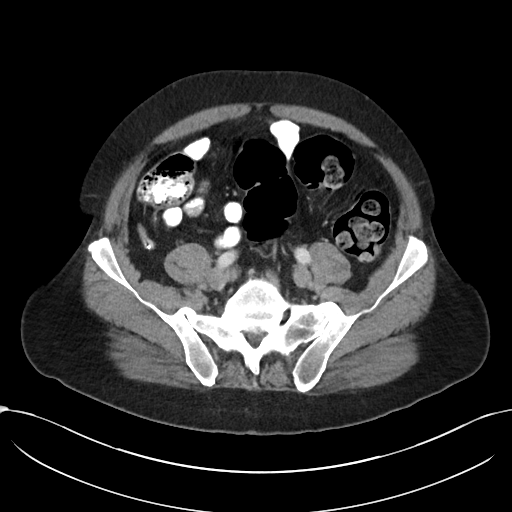
[im 48/96  soft-tissue]
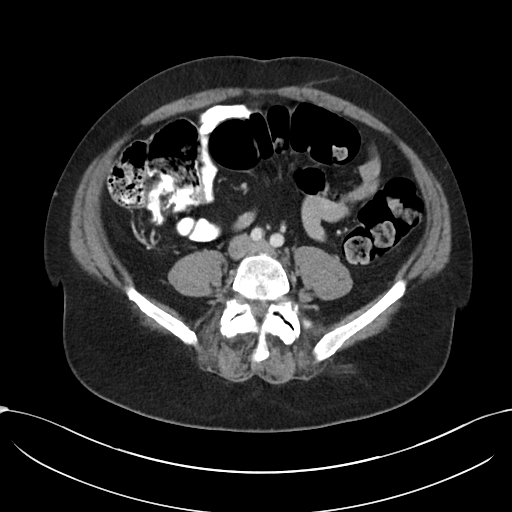
[im 55/96  soft-tissue]
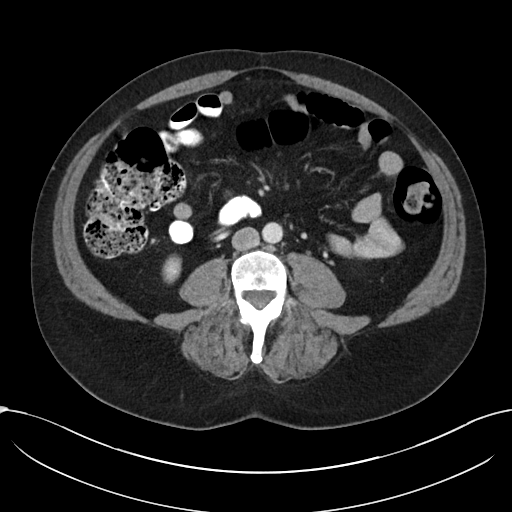
[im 62/96  soft-tissue]
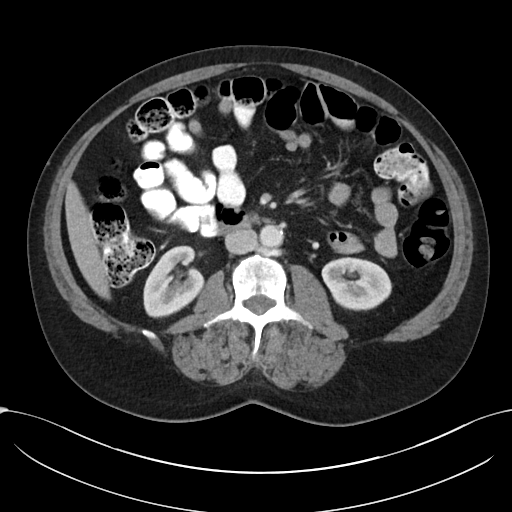
[im 62/96  bone]
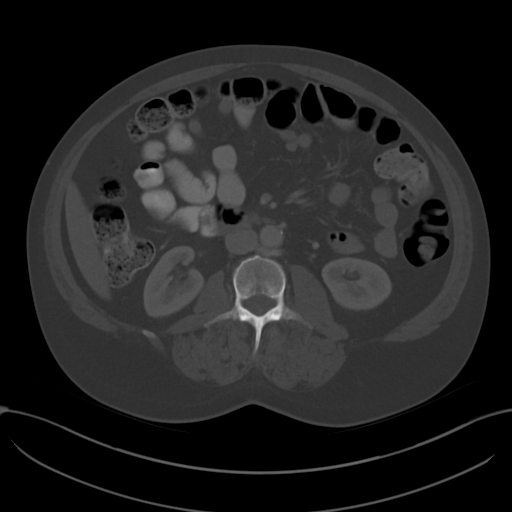
[im 68/96  soft-tissue]
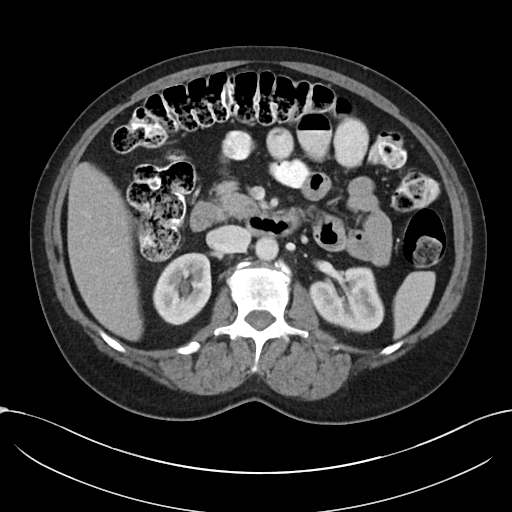
[im 75/96  soft-tissue]
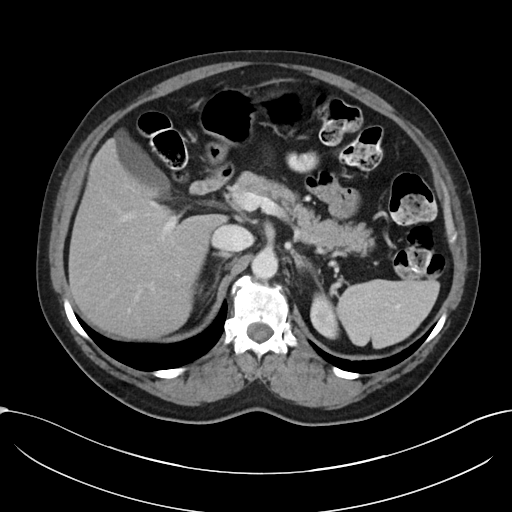
[im 82/96  soft-tissue]
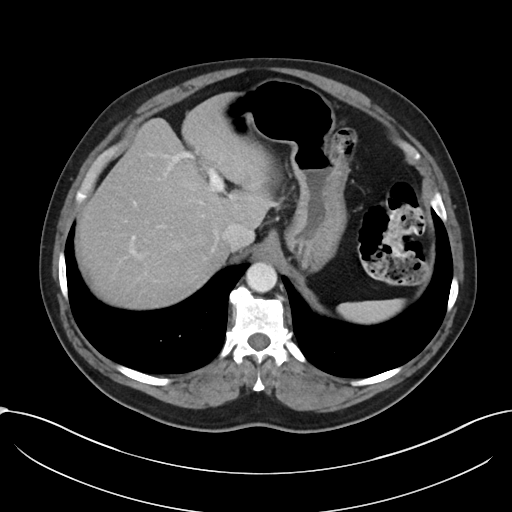
[im 89/96  soft-tissue]
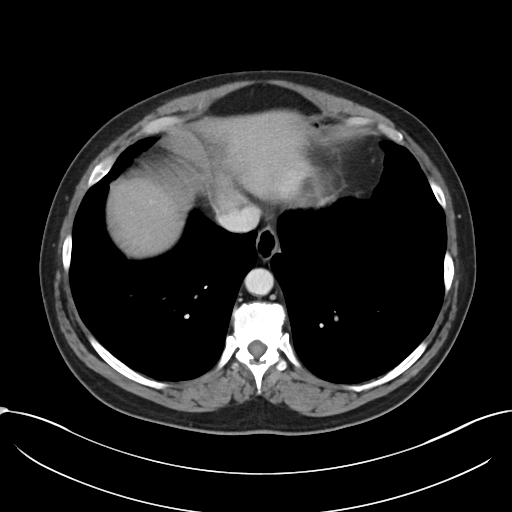

[Series 5: coronal · coronal · 0.83mm/px · 3 of 114 slices shown]
[im 38/114  soft-tissue]
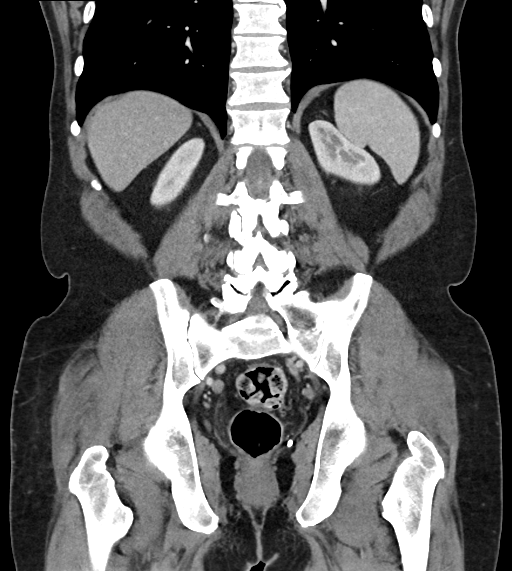
[im 51/114  soft-tissue]
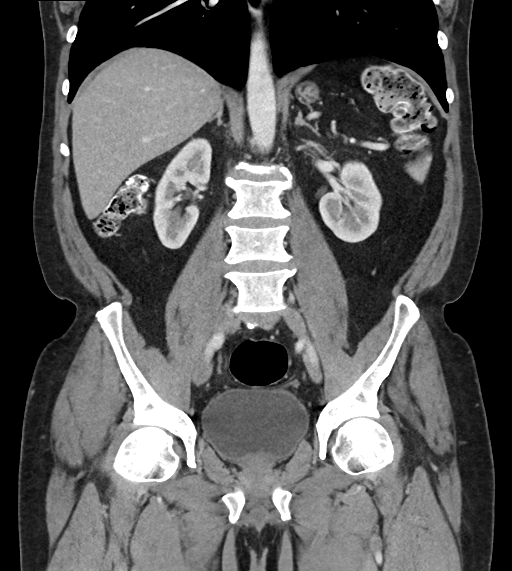
[im 63/114  soft-tissue]
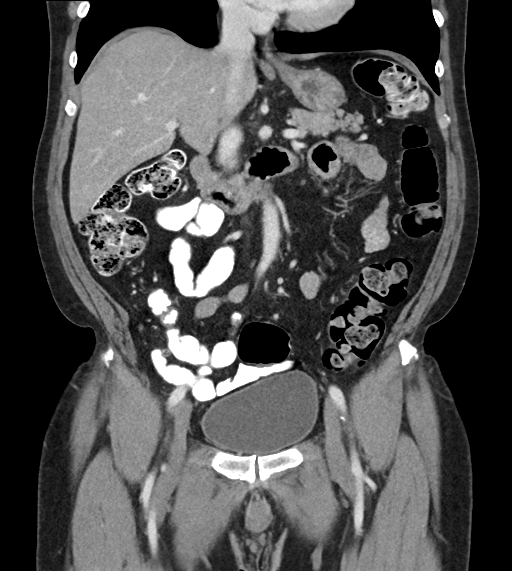

[16 of 46 positions shown; findings below may reference images not displayed]

RADIATION DOSE REDUCTION: This exam was performed according to the
departmental dose-optimization program which includes automated
exposure control, adjustment of the mA and/or kV according to
patient size and/or use of iterative reconstruction technique.

CONTRAST:  100mL OMNIPAQUE IOHEXOL 300 MG/ML  SOLN
FINDINGS: Lower chest: Right no acute abnormality.

Hepatobiliary: No focal liver abnormality. No gallstones,
gallbladder wall thickening, or pericholecystic fluid. No biliary
dilatation.

Pancreas: No focal lesion. Normal pancreatic contour. No surrounding
inflammatory changes. No main pancreatic ductal dilatation.

Spleen: Normal in size without focal abnormality.

Adrenals/Urinary Tract:

No adrenal nodule bilaterally.

Bilateral kidneys enhance symmetrically.

No hydronephrosis. No hydroureter.

The urinary bladder is unremarkable.

On delayed imaging, there is no urothelial wall thickening and there
are no filling defects in the opacified portions of the bilateral
collecting systems or ureters.

Stomach/Bowel: Stomach is within normal limits. No evidence of bowel
wall thickening or dilatation. Appendix appears normal in caliber.
An appendicolith is noted within its lumen. No periappendiceal
inflammatory changes.

Vascular/Lymphatic: No abdominal aorta or iliac aneurysm. Mild
atherosclerotic plaque of the aorta and its branches. No abdominal,
pelvic, or inguinal lymphadenopathy.

Reproductive: Prostate is unremarkable.

Other: No intraperitoneal free fluid. No intraperitoneal free gas.
No organized fluid collection.

Musculoskeletal:

Tiny fat containing umbilical hernia.  No inguinal hernia.

No suspicious lytic or blastic osseous lesions. No acute displaced
fracture. Multilevel degenerative changes of the spine.
IMPRESSION: 1. Status post inguinal hernia repair with no findings of recurrent
hernia or surgical complication.
2. No acute intra-abdominal or intrapelvic abnormality.
3.  Aortic Atherosclerosis (ITE6H-6L7.7).

## 2024-02-02 ENCOUNTER — Encounter: Payer: Self-pay | Admitting: Physician Assistant

## 2024-02-02 ENCOUNTER — Other Ambulatory Visit: Payer: Self-pay | Admitting: Physician Assistant

## 2024-02-02 ENCOUNTER — Ambulatory Visit: Attending: Physician Assistant | Admitting: Physician Assistant

## 2024-02-02 VITALS — BP 119/60 | HR 56 | Ht 70.0 in | Wt 195.6 lb

## 2024-02-02 DIAGNOSIS — R55 Syncope and collapse: Secondary | ICD-10-CM

## 2024-02-02 DIAGNOSIS — I480 Paroxysmal atrial fibrillation: Secondary | ICD-10-CM | POA: Diagnosis not present

## 2024-02-02 MED ORDER — HYDROCHLOROTHIAZIDE 25 MG PO TABS: 12.5000 mg | ORAL_TABLET | ORAL | Status: AC
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
## 2024-02-02 NOTE — Patient Instructions (Signed)
 Medication Instructions:   START  TAKING:  hydrochlorothiazide   EVERY OTHER DAY    *If you need a refill on your cardiac medications before your next appointment, please call your pharmacy*  Lab Work: NONE ORDERED  TODAY    If you have labs (blood work) drawn today and your tests are completely normal, you will receive your results only by: MyChart Message (if you have MyChart) OR A paper copy in the mail If you have any lab test that is abnormal or we need to change your treatment, we will call you to review the results.   Testing/Procedures: NONE ORDERED  TODAY    Follow-Up: At [facility], you and your health needs are our priority.  As part of our continuing mission to provide you with exceptional heart care, our providers are all part of one team.  This team includes your primary Cardiologist (physician) and Advanced Practice Providers or APPs (Physician Assistants and Nurse Practitioners) who all work together to provide you with the care you need, when you need it.   Your next appointment:    1 year(s)  Provider:  DR KATE      We recommend signing up for the patient portal called MyChart.  Sign up information is provided on this After Visit Summary.  MyChart is used to connect with patients for Virtual Visits (Telemedicine).  Patients are able to view lab/test results, encounter notes, upcoming appointments, etc.  Non-urgent messages can be sent to your provider as well.   To learn more about what you can do with MyChart, go to [email or web address].   Other Instructions
